# Patient Record
Sex: Female | Born: 1960 | Race: Black or African American | Hispanic: No | Smoking: Never smoker
Health system: Southern US, Community
[De-identification: ages and names within clinical notes are randomized; demographics above are authoritative.]

## PROBLEM LIST (undated history)

## (undated) DIAGNOSIS — K219 Gastro-esophageal reflux disease without esophagitis: Secondary | ICD-10-CM

## (undated) DIAGNOSIS — Z9221 Personal history of antineoplastic chemotherapy: Secondary | ICD-10-CM

## (undated) DIAGNOSIS — T884XXA Failed or difficult intubation, initial encounter: Secondary | ICD-10-CM

## (undated) DIAGNOSIS — I509 Heart failure, unspecified: Secondary | ICD-10-CM

## (undated) DIAGNOSIS — C50919 Malignant neoplasm of unspecified site of unspecified female breast: Secondary | ICD-10-CM

## (undated) DIAGNOSIS — G4730 Sleep apnea, unspecified: Secondary | ICD-10-CM

## (undated) DIAGNOSIS — J329 Chronic sinusitis, unspecified: Secondary | ICD-10-CM

## (undated) DIAGNOSIS — I4891 Unspecified atrial fibrillation: Secondary | ICD-10-CM

## (undated) DIAGNOSIS — I10 Essential (primary) hypertension: Secondary | ICD-10-CM

## (undated) DIAGNOSIS — Z923 Personal history of irradiation: Secondary | ICD-10-CM

## (undated) DIAGNOSIS — T8859XA Other complications of anesthesia, initial encounter: Secondary | ICD-10-CM

## (undated) DIAGNOSIS — Z853 Personal history of malignant neoplasm of breast: Secondary | ICD-10-CM

## (undated) DIAGNOSIS — K635 Polyp of colon: Secondary | ICD-10-CM

## (undated) DIAGNOSIS — E119 Type 2 diabetes mellitus without complications: Secondary | ICD-10-CM

## (undated) DIAGNOSIS — T4145XA Adverse effect of unspecified anesthetic, initial encounter: Secondary | ICD-10-CM

## (undated) HISTORY — DX: Polyp of colon: K63.5

## (undated) HISTORY — DX: Chronic sinusitis, unspecified: J32.9

## (undated) HISTORY — PX: DILATION AND CURETTAGE OF UTERUS: SHX78

## (undated) HISTORY — DX: Unspecified atrial fibrillation: I48.91

## (undated) HISTORY — PX: KNEE ARTHROSCOPY: SUR90

## (undated) HISTORY — PX: OTHER SURGICAL HISTORY: SHX169

## (undated) HISTORY — DX: Type 2 diabetes mellitus without complications: E11.9

## (undated) HISTORY — DX: Personal history of malignant neoplasm of breast: Z85.3

## (undated) HISTORY — DX: Essential (primary) hypertension: I10

## (undated) HISTORY — DX: Personal history of irradiation: Z92.3

## (undated) HISTORY — DX: Personal history of antineoplastic chemotherapy: Z92.21

## (undated) HISTORY — PX: COLONOSCOPY: SHX174

## (undated) HISTORY — PX: TONSILLECTOMY: SUR1361

## (undated) HISTORY — PX: KNEE SURGERY: SHX244

---

## 1988-11-24 DIAGNOSIS — Z853 Personal history of malignant neoplasm of breast: Secondary | ICD-10-CM

## 1988-11-24 DIAGNOSIS — Z9221 Personal history of antineoplastic chemotherapy: Secondary | ICD-10-CM

## 1988-11-24 HISTORY — DX: Personal history of malignant neoplasm of breast: Z85.3

## 1988-11-24 HISTORY — DX: Personal history of antineoplastic chemotherapy: Z92.21

## 1988-11-24 HISTORY — PX: MASTECTOMY: SHX3

## 1994-11-24 HISTORY — PX: ABDOMINAL HYSTERECTOMY: SHX81

## 1995-11-25 DIAGNOSIS — Z923 Personal history of irradiation: Secondary | ICD-10-CM

## 1995-11-25 HISTORY — DX: Personal history of irradiation: Z92.3

## 2004-09-04 ENCOUNTER — Ambulatory Visit: Payer: Self-pay | Admitting: General Surgery

## 2004-10-01 ENCOUNTER — Ambulatory Visit: Payer: Self-pay | Admitting: Oncology

## 2004-10-24 ENCOUNTER — Ambulatory Visit: Payer: Self-pay | Admitting: Oncology

## 2005-01-18 ENCOUNTER — Other Ambulatory Visit: Payer: Self-pay

## 2005-01-18 ENCOUNTER — Emergency Department: Payer: Self-pay | Admitting: Emergency Medicine

## 2005-03-26 ENCOUNTER — Encounter: Payer: Self-pay | Admitting: Orthopedic Surgery

## 2005-04-21 ENCOUNTER — Ambulatory Visit: Payer: Self-pay | Admitting: Orthopedic Surgery

## 2005-04-21 ENCOUNTER — Ambulatory Visit: Payer: Self-pay | Admitting: Internal Medicine

## 2005-08-14 ENCOUNTER — Ambulatory Visit: Payer: Self-pay | Admitting: Orthopedic Surgery

## 2005-09-04 ENCOUNTER — Other Ambulatory Visit: Payer: Self-pay

## 2005-09-09 ENCOUNTER — Ambulatory Visit: Payer: Self-pay | Admitting: General Surgery

## 2005-09-11 ENCOUNTER — Ambulatory Visit: Payer: Self-pay | Admitting: Orthopedic Surgery

## 2005-09-18 ENCOUNTER — Encounter: Payer: Self-pay | Admitting: Orthopedic Surgery

## 2005-09-24 ENCOUNTER — Encounter: Payer: Self-pay | Admitting: Orthopedic Surgery

## 2005-10-22 ENCOUNTER — Ambulatory Visit: Payer: Self-pay | Admitting: Oncology

## 2005-10-24 ENCOUNTER — Ambulatory Visit: Payer: Self-pay | Admitting: Oncology

## 2005-11-24 ENCOUNTER — Ambulatory Visit: Payer: Self-pay | Admitting: Oncology

## 2006-04-06 ENCOUNTER — Emergency Department: Payer: Self-pay | Admitting: Emergency Medicine

## 2006-04-08 ENCOUNTER — Emergency Department: Payer: Self-pay | Admitting: Emergency Medicine

## 2006-10-09 ENCOUNTER — Ambulatory Visit: Payer: Self-pay | Admitting: General Surgery

## 2006-11-24 DIAGNOSIS — K635 Polyp of colon: Secondary | ICD-10-CM

## 2006-11-24 DIAGNOSIS — I10 Essential (primary) hypertension: Secondary | ICD-10-CM

## 2006-11-24 HISTORY — DX: Essential (primary) hypertension: I10

## 2006-11-24 HISTORY — DX: Polyp of colon: K63.5

## 2006-12-10 ENCOUNTER — Ambulatory Visit: Payer: Self-pay | Admitting: Oncology

## 2006-12-25 ENCOUNTER — Ambulatory Visit: Payer: Self-pay | Admitting: Oncology

## 2007-03-10 ENCOUNTER — Ambulatory Visit: Payer: Self-pay | Admitting: General Practice

## 2007-03-10 ENCOUNTER — Other Ambulatory Visit: Payer: Self-pay

## 2007-05-03 ENCOUNTER — Ambulatory Visit: Payer: Self-pay | Admitting: General Practice

## 2007-06-16 ENCOUNTER — Ambulatory Visit: Payer: Self-pay | Admitting: General Practice

## 2007-07-13 ENCOUNTER — Ambulatory Visit: Payer: Self-pay | Admitting: Family Medicine

## 2007-08-31 ENCOUNTER — Ambulatory Visit: Payer: Self-pay | Admitting: General Surgery

## 2007-10-25 ENCOUNTER — Ambulatory Visit: Payer: Self-pay | Admitting: General Surgery

## 2008-08-24 ENCOUNTER — Ambulatory Visit: Payer: Self-pay | Admitting: General Surgery

## 2008-10-30 ENCOUNTER — Ambulatory Visit: Payer: Self-pay | Admitting: General Surgery

## 2008-12-11 ENCOUNTER — Ambulatory Visit: Payer: Self-pay | Admitting: General Surgery

## 2009-10-31 ENCOUNTER — Ambulatory Visit: Payer: Self-pay | Admitting: General Surgery

## 2010-06-04 ENCOUNTER — Other Ambulatory Visit: Payer: Self-pay | Admitting: Internal Medicine

## 2010-06-06 ENCOUNTER — Ambulatory Visit: Payer: Self-pay | Admitting: Internal Medicine

## 2010-08-27 ENCOUNTER — Ambulatory Visit: Payer: Self-pay

## 2010-10-12 ENCOUNTER — Ambulatory Visit: Payer: Self-pay | Admitting: General Practice

## 2010-11-01 ENCOUNTER — Ambulatory Visit: Payer: Self-pay | Admitting: General Practice

## 2010-11-20 ENCOUNTER — Ambulatory Visit: Payer: Self-pay | Admitting: General Surgery

## 2012-01-20 ENCOUNTER — Ambulatory Visit: Payer: Self-pay | Admitting: General Surgery

## 2013-01-01 ENCOUNTER — Encounter: Payer: Self-pay | Admitting: *Deleted

## 2013-01-01 DIAGNOSIS — C801 Malignant (primary) neoplasm, unspecified: Secondary | ICD-10-CM | POA: Insufficient documentation

## 2013-01-01 DIAGNOSIS — I10 Essential (primary) hypertension: Secondary | ICD-10-CM | POA: Insufficient documentation

## 2013-01-26 ENCOUNTER — Ambulatory Visit: Payer: Self-pay | Admitting: General Surgery

## 2013-02-01 ENCOUNTER — Encounter: Payer: Self-pay | Admitting: *Deleted

## 2013-02-08 ENCOUNTER — Encounter: Payer: Self-pay | Admitting: *Deleted

## 2013-02-09 ENCOUNTER — Encounter: Payer: Self-pay | Admitting: General Surgery

## 2013-02-09 ENCOUNTER — Ambulatory Visit (INDEPENDENT_AMBULATORY_CARE_PROVIDER_SITE_OTHER): Payer: 59 | Admitting: General Surgery

## 2013-02-09 VITALS — BP 115/80 | HR 80 | Resp 18 | Ht 66.0 in | Wt 334.0 lb

## 2013-02-09 DIAGNOSIS — Z853 Personal history of malignant neoplasm of breast: Secondary | ICD-10-CM | POA: Insufficient documentation

## 2013-02-09 NOTE — Progress Notes (Signed)
Subjective:     Patient ID: Mandy Roberts, female   DOB: [birth year], 52 y.o.   MRN: [id number]  HPI Patient presents for a follow up mammogram. Patient has a history of breast cancer which was diagnosed in 1990 in the left breast with a reoccurrence in 1998 in the left side of neck. Patient had left modified radical mastectomy of left breast in 1990. Patient underwent radiation and chemotherapy after she had reoccurrence in super clavicular node. Underwent two course of anti-hormonal therapy.    Review of Systems  Constitutional: Negative.   Respiratory: Negative.   Cardiovascular: Negative.        Objective:   Physical Exam  Constitutional: She appears well-developed and well-nourished.  Eyes: Conjunctivae are normal. Pupils are equal, round, and reactive to light.  Neck: Neck supple. No tracheal deviation present. No mass and no thyromegaly present.  Cardiovascular: Normal rate, regular rhythm and normal heart sounds.   Pulses:      Carotid pulses are 2+ on the right side, and 2+ on the left side.      Dorsalis pedis pulses are 2+ on the right side, and 2+ on the left side.       Posterior tibial pulses are 2+ on the right side, and 2+ on the left side.  Pulmonary/Chest: Effort normal and breath sounds normal. Right breast exhibits no inverted nipple, no mass, no nipple discharge, no skin change and no tenderness.    Abdominal: Soft. Bowel sounds are normal. There is no tenderness. No hernia. Hernia confirmed negative in the ventral area.  Lymphadenopathy:    She has no cervical adenopathy.    She has no axillary adenopathy.       Assessment:     Stable exam, no sign of cancer recurrence     Plan:     Pt still thinking about surgery for her obesity.

## 2013-02-09 NOTE — Patient Instructions (Signed)
Encouraged her to consult with bariatric surgeon.

## 2013-05-31 ENCOUNTER — Ambulatory Visit: Payer: Self-pay | Admitting: Podiatry

## 2013-09-12 ENCOUNTER — Emergency Department: Payer: Self-pay | Admitting: Emergency Medicine

## 2013-09-12 LAB — MAGNESIUM: Magnesium: 1.6 mg/dL — ABNORMAL LOW

## 2013-09-12 LAB — CBC
HGB: 14.8 g/dL (ref 12.0–16.0)
MCH: 29.9 pg (ref 26.0–34.0)
RBC: 4.94 10*6/uL (ref 3.80–5.20)
RDW: 13.8 % (ref 11.5–14.5)
WBC: 6.9 10*3/uL (ref 3.6–11.0)

## 2013-09-12 LAB — T4, FREE: Free Thyroxine: 0.82 ng/dL (ref 0.76–1.46)

## 2013-09-12 LAB — COMPREHENSIVE METABOLIC PANEL
Albumin: 3.3 g/dL — ABNORMAL LOW (ref 3.4–5.0)
Alkaline Phosphatase: 82 U/L (ref 50–136)
Anion Gap: 5 — ABNORMAL LOW (ref 7–16)
BUN: 9 mg/dL (ref 7–18)
Bilirubin,Total: 0.3 mg/dL (ref 0.2–1.0)
Co2: 27 mmol/L (ref 21–32)
Creatinine: 0.72 mg/dL (ref 0.60–1.30)
EGFR (African American): >60
EGFR (Non-African Amer.): >60
Glucose: 148 mg/dL — ABNORMAL HIGH (ref 65–99)
Potassium: 3.9 mmol/L (ref 3.5–5.1)

## 2013-09-12 LAB — TROPONIN I: Troponin-I: <0.02 ng/mL

## 2013-09-16 ENCOUNTER — Encounter: Payer: Self-pay | Admitting: Cardiovascular Disease

## 2013-09-16 ENCOUNTER — Encounter: Payer: Self-pay | Admitting: *Deleted

## 2013-09-16 ENCOUNTER — Ambulatory Visit (INDEPENDENT_AMBULATORY_CARE_PROVIDER_SITE_OTHER): Payer: 59 | Admitting: Cardiovascular Disease

## 2013-09-16 VITALS — BP 137/85 | HR 87 | Ht 66.0 in | Wt 335.0 lb

## 2013-09-16 DIAGNOSIS — I10 Essential (primary) hypertension: Secondary | ICD-10-CM

## 2013-09-16 DIAGNOSIS — G4733 Obstructive sleep apnea (adult) (pediatric): Secondary | ICD-10-CM

## 2013-09-16 DIAGNOSIS — R079 Chest pain, unspecified: Secondary | ICD-10-CM

## 2013-09-16 DIAGNOSIS — R002 Palpitations: Secondary | ICD-10-CM

## 2013-09-16 DIAGNOSIS — I4891 Unspecified atrial fibrillation: Secondary | ICD-10-CM

## 2013-09-16 MED ORDER — METOPROLOL TARTRATE 50 MG PO TABS: 50.0000 mg | ORAL_TABLET | Freq: Two times a day (BID) | ORAL | Status: DC

## 2013-09-16 MED ORDER — APIXABAN 5 MG PO TABS: 5.0000 mg | ORAL_TABLET | Freq: Two times a day (BID) | ORAL | Status: DC

## 2013-09-16 MED ORDER — FUROSEMIDE 20 MG PO TABS: 20.0000 mg | ORAL_TABLET | Freq: Every day | ORAL | Status: DC | PRN

## 2013-09-16 NOTE — Assessment & Plan Note (Signed)
We have encouraged continued exercise, careful diet management in an effort to lose weight. Underlying weight is likely contributing to poor sleep hygiene.

## 2013-09-16 NOTE — Progress Notes (Signed)
Patient ID: Mandy Roberts, female    DOB: [birth year], 52 y.o.   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  She reports having a very long history of paroxysmal palpitations. It has been worse recently. She reports having episodes 6-7 times per day. While at work, she was started on a telemetry monitor and this showed a run of atrial fibrillation on 09/12/2013. She was sitting at a desk at the time. She was relatively asymptomatic but did have some palpitations. She did feel that it was going mildly fast. Seemed to resolve without intervention.  She wakes frequently at nighttime, possibly from her breathing. Sometimes she feels that she has extra fluid. She does not have a diuretic.   Prior stress test July 2011 showing attenuation artifact in the anterior wall, ejection fraction 50% Lower extremity Doppler July 2014 showing no DVT Echocardiogram July 2011 showing ejection fraction 45-50%, moderate LVH Echocardiogram August 2008 showing ejection fraction 40-45%  Telemetry strips provided today from 09/12/2013 showing clear changed from normal sinus rhythm to atrial fibrillation with rate 139 beats per minute Magnesium at that time was 1.6 him a potassium 3.9., TSH 2.4   EKG today shows normal sinus rhythm with rate 87 beats per minute with no significant ST or T wave changes EKG from 09/12/2013 showing normal sinus rhythm with PVCs EKG from April 2008 showing normal sinus rhythm with rate 77 beats per minute    Outpatient Encounter Prescriptions as of 09/16/2013  Medication Sig Dispense Refill  . cloNIDine (CATAPRES - DOSED IN MG/24 HR) 0.1 mg/24hr patch Place 1 patch onto the skin once a week.      Marland Kitchen ibuprofen (ADVIL,MOTRIN) 200 MG tablet Take 200 mg by mouth as needed for pain.        Review  of Systems  Constitutional: Negative.   HENT: Negative.   Eyes: Negative.   Respiratory: Negative.   Cardiovascular: Positive for palpitations.  Gastrointestinal: Negative.   Endocrine: Negative.   Musculoskeletal: Negative.   Skin: Negative.   Allergic/Immunologic: Negative.   Neurological: Negative.   Hematological: Negative.   Psychiatric/Behavioral: Negative.   All other systems reviewed and are negative.    BP 137/85  Pulse 87  Ht 5\' 6"  (1.676 m)  Wt 335 lb (151.955 kg)  BMI 54.1 kg/m2  Physical Exam  Nursing note and vitals reviewed. Constitutional: She is oriented to person, place, and time. She appears well-developed and well-nourished.  HENT:  Head: Normocephalic.  Nose: Nose normal.  Mouth/Throat: Oropharynx is clear and moist.  Eyes: Conjunctivae are normal. Pupils are equal, round, and reactive to light.  Neck: Normal range of motion. Neck supple. No JVD present.  Cardiovascular: Normal rate, regular rhythm, S1 normal, S2 normal, normal heart sounds and intact distal pulses.  Exam reveals no gallop and no friction rub.   No murmur heard. Pulmonary/Chest: Effort normal and breath sounds normal. No respiratory distress. She has no wheezes. She has no rales. She exhibits no tenderness.  Abdominal: Soft. Bowel sounds are normal. She exhibits no distension. There is no tenderness.  Musculoskeletal: Normal range of motion. She exhibits no edema and no tenderness.  Lymphadenopathy:    She has no cervical adenopathy.  Neurological: She is alert and oriented to person, place, and time. Coordination normal.  Skin: Skin is warm and dry.  No rash noted. No erythema.  Psychiatric: She has a normal mood and affect. Her behavior is normal. Judgment and thought content normal.    Assessment and Plan

## 2013-09-16 NOTE — Patient Instructions (Signed)
Please start the metoprolol 1/2 pill twice a day Ok to wean down or off the clonidine patch  Try to increase the metoprolol up to 50 mg twice  a day if tolerated (whole pill)  Start eliquis 5 mg twice a day (blood thinner) Take lasix as needed with potassium for swelling  Monitor your heart rhythm  Please call us if you have new issues that need to be addressed before your next appt.  Your physician wants you to follow-up in: 1 month.

## 2013-09-16 NOTE — Assessment & Plan Note (Signed)
I suspect she has had a long history of paroxysmal atrial fibrillation. Possibly related to her underlying sleep apnea, unable to exclude diastolic CHF (mild). We have suggested she start on metoprolol 25 mg twice a day with slow titration up to 50 mg twice a day if tolerated. We'll start Lasix 20 mg as needed for shortness of breath, leg edema, weight gain. We'll also start Eliquis 5 mg twice a day as she continues to have numerous episodes daily.   We'll see her back in close followup. She's had 2 previous echocardiograms showing mildly depressed ejection fraction

## 2013-09-16 NOTE — Assessment & Plan Note (Signed)
In followup, we'll discuss scheduling a sleep study

## 2013-09-16 NOTE — Assessment & Plan Note (Signed)
With increasing metoprolol, we may need to hold her clonidine. We have suggested he slowly wean her clonidine if blood pressure decreases on beta blockers and Lasix.

## 2013-09-19 ENCOUNTER — Encounter: Payer: Self-pay | Admitting: *Deleted

## 2013-09-27 ENCOUNTER — Telehealth: Payer: Self-pay

## 2013-09-27 ENCOUNTER — Encounter: Payer: Self-pay | Admitting: *Deleted

## 2013-09-27 NOTE — Telephone Encounter (Signed)
Spoke w/ pt.  She requests a new letter indicating her diagnosis. Left new letter at front desk for her to pick up.

## 2013-09-27 NOTE — Telephone Encounter (Signed)
Lmom for pt to call back. 

## 2013-09-27 NOTE — Telephone Encounter (Signed)
Pt states she had to cancel her cruise and she has trip cancellation insurance, and needs a note stating she was here on the 09/16/2013. Please call.

## 2013-09-29 ENCOUNTER — Other Ambulatory Visit: Payer: Self-pay

## 2013-10-07 ENCOUNTER — Ambulatory Visit: Payer: Self-pay | Admitting: Internal Medicine

## 2013-10-17 ENCOUNTER — Ambulatory Visit: Payer: 59 | Admitting: Cardiovascular Disease

## 2014-02-06 ENCOUNTER — Ambulatory Visit: Payer: 59 | Admitting: General Surgery

## 2014-02-16 ENCOUNTER — Encounter: Payer: Self-pay | Admitting: *Deleted

## 2014-09-19 ENCOUNTER — Ambulatory Visit: Payer: Self-pay | Admitting: General Surgery

## 2014-09-19 ENCOUNTER — Encounter: Payer: Self-pay | Admitting: General Surgery

## 2014-09-20 ENCOUNTER — Encounter: Payer: Self-pay | Admitting: General Surgery

## 2014-09-25 ENCOUNTER — Encounter: Payer: Self-pay | Admitting: Cardiovascular Disease

## 2014-09-26 ENCOUNTER — Ambulatory Visit (INDEPENDENT_AMBULATORY_CARE_PROVIDER_SITE_OTHER): Payer: Self-pay | Admitting: General Surgery

## 2014-09-26 ENCOUNTER — Encounter: Payer: Self-pay | Admitting: General Surgery

## 2014-09-26 VITALS — BP 132/74 | HR 76 | Resp 14 | Ht 66.0 in | Wt 337.0 lb

## 2014-09-26 DIAGNOSIS — Z86010 Personal history of colonic polyps: Secondary | ICD-10-CM

## 2014-09-26 DIAGNOSIS — Z853 Personal history of malignant neoplasm of breast: Secondary | ICD-10-CM

## 2014-09-26 MED ORDER — POLYETHYLENE GLYCOL 3350 17 GM/SCOOP PO POWD: 1.0000 | Freq: Once | ORAL | Status: DC

## 2014-09-26 NOTE — Patient Instructions (Addendum)
Continue self breast exams. Call office for any new breast issues or concerns.  Colonoscopy A colonoscopy is an exam to look at the entire large intestine (colon). This exam can help find problems such as tumors, polyps, inflammation, and areas of bleeding. The exam takes about 1 hour.  LET [facility] CARE PROVIDER KNOW ABOUT:   Any allergies you have.  All medicines you are taking, including vitamins, herbs, eye drops, creams, and over-the-counter medicines.  Previous problems you or members of your family have had with the use of anesthetics.  Any blood disorders you have.  Previous surgeries you have had.  Medical conditions you have. RISKS AND COMPLICATIONS  Generally, this is a safe procedure. However, as with any procedure, complications can occur. Possible complications include:  Bleeding.  Tearing or rupture of the colon wall.  Reaction to medicines given during the exam.  Infection (rare). BEFORE THE PROCEDURE   Ask your health care provider about changing or stopping your regular medicines.  You may be prescribed an oral bowel prep. This involves drinking a large amount of medicated liquid, starting the day before your procedure. The liquid will cause you to have multiple loose stools until your stool is almost clear or light green. This cleans out your colon in preparation for the procedure.  Do not eat or drink anything else once you have started the bowel prep, unless your health care provider tells you it is safe to do so.  Arrange for someone to drive you home after the procedure. PROCEDURE   You will be given medicine to help you relax (sedative).  You will lie on your side with your knees bent.  A long, flexible tube with a light and camera on the end (colonoscope) will be inserted through the rectum and into the colon. The camera sends video back to a computer screen as it moves through the colon. The colonoscope also releases carbon dioxide gas to inflate  the colon. This helps your health care provider see the area better.  During the exam, your health care provider may take a small tissue sample (biopsy) to be examined under a microscope if any abnormalities are found.  The exam is finished when the entire colon has been viewed. AFTER THE PROCEDURE   Do not drive for 24 hours after the exam.  You may have a small amount of blood in your stool.  You may pass moderate amounts of gas and have mild abdominal cramping or bloating. This is caused by the gas used to inflate your colon during the exam.  Ask when your test results will be ready and how you will get your results. Make sure you get your test results. Document Released: 11/07/2000 Document Revised: 08/31/2013 Document Reviewed: 07/18/2013 [facility] Patient Information 2015 [place], [place]. This information is not intended to replace advice given to you by your health care provider. Make sure you discuss any questions you have with your health care provider.  Patient has been scheduled for a colonoscopy at [facility] on 10/11/14. She is aware to pre register with the hospital at least 2 days prior. She will stop her Eliquis and start ASA 81 mg 3 days prior to her colonoscopy. She will only take her Sotalol at 6 am with a small sip of water the morning of. Miralax prescription has been sent into her pharmacy. The patient is aware of date and instructions.

## 2014-09-26 NOTE — Progress Notes (Signed)
Patient ID: Mandy Roberts, female   DOB: Apr 12, 1961, 53 y.o.   MRN: [id number]  Chief Complaint  Patient presents with  . Follow-up    mammogram    HPI Mandy Roberts is a 53 y.o. female.  who presents for breast cancer follow up and breast evaluation. The most recent right mammogram was done on 09-19-14.  Patient has a history of breast cancer which was diagnosed in 1990 in the left breast with a reoccurrence in 1998 in the left side of neck. Patient had left modified radical mastectomy of left breast in 1990. Patient underwent radiation and chemotherapy after she had reoccurrence in super clavicular node. Underwent two course of anti-hormonal therapy.   She was in the process of arranging bariatric surgery when her husband had cardiac issues. Last colonoscopy was 2008 with colon polyps.  Patient does perform regular self breast checks and gets regular mammograms done.  No new breast issues.  HPI  Past Medical History  Diagnosis Date  . Hypertension 2008  . Asthma   . Personal history of malignant neoplasm of breast 1990     left mastectomy   . Cancer 1990     left mastectomy ,had recurrence in left suprclavic nodes several yrs ago-treated with radiation and chemo  . Atrial fibrillation   . Sinusitis   . Diabetes mellitus without complication   . Colon polyp 2008    Past Surgical History  Procedure Laterality Date  . Colonoscopy  2008    Dr. Jamal Collin  . Knee surgery Left 2009, 2010, 2012  . Mastectomy  1990    left   . Abdominal hysterectomy  1996  . Dilation and curettage of uterus    . Tonsillectomy      Family History  Problem Relation Age of Onset  . Breast cancer    . Heart failure Mother   . Hypertension Mother   . Hyperlipidemia Mother     Social History History  Substance Use Topics  . Smoking status: Never Smoker   . Smokeless tobacco: Never Used  . Alcohol Use: No    Allergies  Allergen Reactions  . Tdap [Diphth-Acell Pertussis-Tetanus]  Shortness Of Breath  . Penicillins Rash    Current Outpatient Prescriptions  Medication Sig Dispense Refill  . apixaban (ELIQUIS) 5 MG TABS tablet Take 1 tablet (5 mg total) by mouth 2 (two) times daily. 60 tablet 6  . ibuprofen (ADVIL,MOTRIN) 200 MG tablet Take 200 mg by mouth as needed for pain.    Marland Kitchen lisinopril-hydrochlorothiazide (PRINZIDE,ZESTORETIC) 10-12.5 MG per tablet Take 1 tablet by mouth daily.    . sotalol (BETAPACE) 80 MG tablet Take 80 mg by mouth 2 (two) times daily.   2   No current facility-administered medications for this visit.    Review of Systems Review of Systems  Constitutional: Negative.   Respiratory: Negative.   Cardiovascular: Negative.     Blood pressure 132/74, pulse 76, resp. rate 14, height 5\' 6"  (1.676 m), weight 337 lb (152.862 kg).  Physical Exam Physical Exam  Constitutional: She is oriented to person, place, and time. She appears well-developed and well-nourished.  Eyes: Conjunctivae are normal. No scleral icterus.  Neck: Neck supple.  Cardiovascular: Normal rate, regular rhythm and normal heart sounds.   Pulses:      Dorsalis pedis pulses are 2+ on the left side.       Posterior tibial pulses are 2+ on the left side.  No edema lower legs, there is a small  brownish colored area above the left anterior ankle.  Pulmonary/Chest: Effort normal and breath sounds normal. Right breast exhibits no inverted nipple, no mass, no nipple discharge, no skin change and no tenderness.  Left mastectomy site well healed incison.  Abdominal: Soft. Normal appearance. There is no tenderness.  Lymphadenopathy:    She has no cervical adenopathy.    She has no axillary adenopathy.  Neurological: She is alert and oriented to person, place, and time.  Skin: Skin is warm and dry.    Data Reviewed Mammogram reviewed and stable.  Assessment    Stable physical exam. History of left breast CA. History of colon polyp    Plan  Colonoscopy with possible  biopsy/polypectomy prn: Information regarding the procedure, including its potential risks and complications (including but not limited to perforation of the bowel, which may require emergency surgery to repair, and bleeding) was verbally given to the patient. Educational information regarding lower instestinal endoscopy was given to the patient. Written instructions for how to complete the bowel prep using Miralax were provided. The importance of drinking ample fluids to avoid dehydration as a result of the prep emphasized.    Follow up in one year with right mammogram and office visit.    Patient has been scheduled for a colonoscopy at [facility] on 10/11/14. She is aware to pre register with the hospital at least 2 days prior. She will stop her Eliquis and start ASA 81 mg 3 days prior to her colonoscopy. She will only take her Sotalol at 6 am with a small sip of water the morning of. Miralax prescription has been sent into her pharmacy. The patient is aware of date and instructions.     Mandy Roberts 09/26/2014, 11:02 AM

## 2014-10-02 ENCOUNTER — Other Ambulatory Visit: Payer: Self-pay | Admitting: General Surgery

## 2014-10-02 DIAGNOSIS — Z86010 Personal history of colonic polyps: Secondary | ICD-10-CM

## 2014-10-11 ENCOUNTER — Ambulatory Visit: Payer: Self-pay | Admitting: General Surgery

## 2014-10-11 DIAGNOSIS — Z86010 Personal history of colonic polyps: Secondary | ICD-10-CM

## 2014-10-12 ENCOUNTER — Encounter: Payer: Self-pay | Admitting: General Surgery

## 2014-11-20 ENCOUNTER — Ambulatory Visit: Payer: Self-pay | Admitting: Specialist

## 2014-11-20 LAB — PROTIME-INR
INR: 1.1
PROTHROMBIN TIME: 13.9 s (ref 11.5–14.7)

## 2014-11-20 LAB — BILIRUBIN, DIRECT

## 2014-11-20 LAB — FERRITIN: Ferritin (ARMC): 289 ng/mL (ref 8–388)

## 2014-11-20 LAB — MAGNESIUM: MAGNESIUM: 2 mg/dL

## 2014-11-20 LAB — PHOSPHORUS: PHOSPHORUS: 2.8 mg/dL (ref 2.5–4.9)

## 2014-11-20 LAB — IRON AND TIBC
Iron Bind.Cap.(Total): 263 ug/dL (ref 250–450)
Iron Saturation: 29 %
Iron: 76 ug/dL (ref 50–170)
UNBOUND IRON-BIND. CAP.: 187 ug/dL

## 2014-11-20 LAB — FOLATE: Folic Acid: 5.2 ng/mL (ref 3.1–17.5)

## 2014-11-20 LAB — LIPASE, BLOOD: Lipase: 64 U/L — ABNORMAL LOW (ref 73–393)

## 2014-11-20 LAB — APTT: Activated PTT: 31.7 secs (ref 23.6–35.9)

## 2014-11-20 LAB — AMYLASE: Amylase: 45 U/L (ref 25–115)

## 2014-11-23 ENCOUNTER — Ambulatory Visit: Payer: Self-pay | Admitting: Specialist

## 2014-11-24 ENCOUNTER — Ambulatory Visit: Payer: Self-pay | Admitting: Specialist

## 2014-11-28 ENCOUNTER — Ambulatory Visit: Payer: Self-pay | Admitting: Specialist

## 2014-12-04 ENCOUNTER — Ambulatory Visit: Payer: Self-pay | Admitting: Internal Medicine

## 2014-12-12 ENCOUNTER — Encounter: Payer: Self-pay | Admitting: General Surgery

## 2014-12-12 ENCOUNTER — Other Ambulatory Visit: Payer: Self-pay

## 2014-12-12 ENCOUNTER — Ambulatory Visit (INDEPENDENT_AMBULATORY_CARE_PROVIDER_SITE_OTHER): Payer: 59 | Admitting: General Surgery

## 2014-12-12 VITALS — BP 140/90 | HR 92 | Resp 20 | Ht 64.0 in | Wt 334.0 lb

## 2014-12-12 DIAGNOSIS — Z01818 Encounter for other preprocedural examination: Secondary | ICD-10-CM

## 2014-12-12 NOTE — Patient Instructions (Signed)
Patient to be scheduled for a endoscopy.

## 2014-12-12 NOTE — Progress Notes (Signed)
Patient ID: Mandy Roberts, female   DOB: 03-Apr-1961, 54 y.o.   MRN: [id number]  Chief Complaint  Patient presents with  . Follow-up    endoscopy    HPI Mandy Roberts is a 54 y.o. female here today to discuss endoscopy. She is undergoing full evaluation in preparation for bariatric surgery and upper endoscopy was requested as part of this  Patient had a chest x-ray and ultrasound-no findings  HPI  Past Medical History  Diagnosis Date  . Hypertension 2008  . Asthma   . Personal history of malignant neoplasm of breast 1990     left mastectomy   . Cancer 1990     left mastectomy ,had recurrence in left suprclavic nodes several yrs ago-treated with radiation and chemo  . Atrial fibrillation   . Sinusitis   . Diabetes mellitus without complication   . Colon polyp 2008    Past Surgical History  Procedure Laterality Date  . Colonoscopy  2008    Dr. Jamal Collin  . Knee surgery Left 2009, 2010, 2012  . Mastectomy  1990    left   . Abdominal hysterectomy  1996  . Dilation and curettage of uterus    . Tonsillectomy      Family History  Problem Relation Age of Onset  . Breast cancer    . Heart failure Mother   . Hypertension Mother   . Hyperlipidemia Mother     Social History History  Substance Use Topics  . Smoking status: Never Smoker   . Smokeless tobacco: Never Used  . Alcohol Use: No    Allergies  Allergen Reactions  . Tdap [Diphth-Acell Pertussis-Tetanus] Shortness Of Breath  . Penicillins Rash    Current Outpatient Prescriptions  Medication Sig Dispense Refill  . apixaban (ELIQUIS) 5 MG TABS tablet Take 1 tablet (5 mg total) by mouth 2 (two) times daily. 60 tablet 6  . ibuprofen (ADVIL,MOTRIN) 200 MG tablet Take 200 mg by mouth as needed for pain.    Marland Kitchen lisinopril-hydrochlorothiazide (PRINZIDE,ZESTORETIC) 10-12.5 MG per tablet Take 1 tablet by mouth daily.    . polyethylene glycol powder (GLYCOLAX/MIRALAX) powder Take 255 g by mouth once. 255 g 0  . sotalol  (BETAPACE) 80 MG tablet Take 80 mg by mouth 2 (two) times daily.   2  . Vitamin D, Ergocalciferol, (DRISDOL) 50000 UNITS CAPS capsule Take 50,000 Units by mouth every 7 (seven) days.     No current facility-administered medications for this visit.    Review of Systems Review of Systems  Constitutional: Positive for chills. Negative for fever, diaphoresis, activity change, appetite change, fatigue and unexpected weight change.  Respiratory: Negative.   Cardiovascular: Negative.   Gastrointestinal: Positive for vomiting. Negative for nausea, abdominal pain, diarrhea, blood in stool, abdominal distention, anal bleeding and rectal pain.    Blood pressure 140/90, pulse 92, resp. rate 20, height 5\' 4"  (1.626 m), weight 334 lb (151.501 kg).  Physical Exam Physical Exam  Constitutional: She is oriented to person, place, and time. She appears well-developed and well-nourished.  Neck: Neck supple. No thyromegaly present.  Cardiovascular: Normal rate, regular rhythm and normal heart sounds.   Pulmonary/Chest: Effort normal and breath sounds normal.  Abdominal: Soft. Normal appearance and bowel sounds are normal. There is no tenderness.  Neurological: She is alert and oriented to person, place, and time.  Skin: Skin is warm and dry.    Data Reviewed None   Assessment    Need for upper endoscopy -pre evaluation for  bariatric surgery    Plan    Discussed endoscopy, patient agrees        Christene Lye 12/12/2014, 10:03 AM

## 2014-12-12 NOTE — Progress Notes (Signed)
Patient is scheduled for an upper endoscopy at [facility] on 12/27/14. She will hold her Amaryl the day of procedure. She will only take her blood pressure medication at 6 am with a small sip of water the morning of the procedure. She is aware to pre register with the hospital at least 2 days prior. Patient is aware of date and instructions.

## 2014-12-13 ENCOUNTER — Ambulatory Visit: Payer: Self-pay | Admitting: Specialist

## 2014-12-20 ENCOUNTER — Other Ambulatory Visit: Payer: Self-pay | Admitting: General Surgery

## 2014-12-20 DIAGNOSIS — Z01818 Encounter for other preprocedural examination: Secondary | ICD-10-CM

## 2014-12-25 ENCOUNTER — Ambulatory Visit: Payer: Self-pay | Admitting: Specialist

## 2014-12-27 ENCOUNTER — Ambulatory Visit: Payer: Self-pay | Admitting: General Surgery

## 2014-12-27 DIAGNOSIS — Z01818 Encounter for other preprocedural examination: Secondary | ICD-10-CM

## 2014-12-28 ENCOUNTER — Encounter: Payer: Self-pay | Admitting: General Surgery

## 2014-12-29 ENCOUNTER — Encounter: Payer: Self-pay | Admitting: General Surgery

## 2015-02-12 ENCOUNTER — Encounter: Payer: Self-pay | Admitting: *Deleted

## 2015-03-19 LAB — SURGICAL PATHOLOGY

## 2015-04-09 ENCOUNTER — Encounter
Admission: RE | Admit: 2015-04-09 | Discharge: 2015-04-09 | Disposition: A | Discharge status: Home / Self Care | Payer: 59 | Source: Ambulatory Visit | Attending: Specialist | Admitting: Specialist

## 2015-04-09 ENCOUNTER — Encounter: Payer: Self-pay | Admitting: Anesthesiology

## 2015-04-09 DIAGNOSIS — Z01812 Encounter for preprocedural laboratory examination: Secondary | ICD-10-CM | POA: Insufficient documentation

## 2015-04-09 DIAGNOSIS — E669 Obesity, unspecified: Secondary | ICD-10-CM | POA: Diagnosis not present

## 2015-04-09 DIAGNOSIS — T884XXA Failed or difficult intubation, initial encounter: Secondary | ICD-10-CM | POA: Insufficient documentation

## 2015-04-09 DIAGNOSIS — Z6841 Body Mass Index (BMI) 40.0 and over, adult: Secondary | ICD-10-CM | POA: Diagnosis not present

## 2015-04-09 DIAGNOSIS — R0981 Nasal congestion: Secondary | ICD-10-CM | POA: Insufficient documentation

## 2015-04-09 HISTORY — DX: Failed or difficult intubation, initial encounter: T88.4XXA

## 2015-04-09 HISTORY — DX: Sleep apnea, unspecified: G47.30

## 2015-04-09 LAB — BASIC METABOLIC PANEL
Anion gap: 7 (ref 5–15)
BUN: 15 mg/dL (ref 6–20)
CO2: 25 mmol/L (ref 22–32)
CREATININE: 0.63 mg/dL (ref 0.44–1.00)
Calcium: 9.4 mg/dL (ref 8.9–10.3)
Chloride: 104 mmol/L (ref 101–111)
GFR calc Af Amer: >60 mL/min (ref >60)
GLUCOSE: 128 mg/dL — AB (ref 65–99)
Potassium: 4.4 mmol/L (ref 3.5–5.1)
Sodium: 136 mmol/L (ref 135–145)

## 2015-04-09 LAB — ABO/RH: ABO/RH(D): O POS

## 2015-04-09 LAB — TYPE AND SCREEN
ABO/RH(D): O POS
Antibody Screen: NEGATIVE

## 2015-04-09 NOTE — Patient Instructions (Signed)
  Your procedure is scheduled on: Apr 17, 2015 Report to Same Day Surgery. To find out your arrival time please call [phone] between 1PM - 3PM on Apr 16, 2015 Remember: Instructions that are not followed completely may result in serious medical risk, up to and including death, or upon the discretion of your surgeon and anesthesiologist your surgery may need to be rescheduled.    __x 1. Do not eat food or drink liquids after midnight. No gum chewing or hard candies.     ____ 2. No Alcohol for 24 hours before or after surgery.   ____ 3. Bring all medications with you on the day of surgery if instructed.    ___x_ 4. Notify your doctor if there is any change in your medical condition     (cold, fever, infections).     Do not wear jewelry, make-up, hairpins, clips or nail polish.  Do not wear lotions, powders, or perfumes. You may wear deodorant.  Do not shave 48 hours prior to surgery. Men may shave face and neck.  Do not bring valuables to the hospital.    [facility] is not responsible for any belongings or valuables.               Contacts, dentures or bridgework may not be worn into surgery.  Leave your suitcase in the car. After surgery it may be brought to your room.  For patients admitted to the hospital, discharge time is determined by your                treatment team.   Patients discharged the day of surgery will not be allowed to drive home.   Please read over the following fact sheets that you were given:   Surgical Site Infection Prevention   ____ Take these medicines the morning of surgery with A SIP OF WATER:    1.Betapace  2.   3.   4.  5.  6.  ____ Fleet Enema (as directed)   __x_ Use CHG Soap as directed  ____ Use inhalers on the day of surgery  ____ Stop metformin 2 days prior to surgery    ____ Take 1/2 of usual insulin dose the night before surgery and none on the morning of surgery.   __x_ Stop Coumadin/Plavix/aspirin on( stop Eliquis seven  days prior to surgery) ____ Stop Anti-inflammatories on (seven to ten days prior to surgery, Ibuprofen)  ____ Stop supplements until after surgery.    __x__ Bring C-Pap to the hospital.

## 2015-04-17 ENCOUNTER — Inpatient Hospital Stay
Admission: RE | Admit: 2015-04-17 | Discharge: 2015-04-19 | DRG: 641 | Disposition: A | Discharge status: Home / Self Care | Payer: 59 | Source: Ambulatory Visit | Attending: Specialist | Admitting: Specialist

## 2015-04-17 ENCOUNTER — Encounter: Payer: Self-pay | Admitting: *Deleted

## 2015-04-17 ENCOUNTER — Inpatient Hospital Stay: Payer: 59 | Admitting: Certified Registered Nurse Anesthetist

## 2015-04-17 ENCOUNTER — Encounter
Admission: RE | Disposition: A | Discharge status: Home / Self Care | Payer: Self-pay | Source: Ambulatory Visit | Attending: Specialist

## 2015-04-17 DIAGNOSIS — E119 Type 2 diabetes mellitus without complications: Secondary | ICD-10-CM | POA: Diagnosis present

## 2015-04-17 DIAGNOSIS — G4733 Obstructive sleep apnea (adult) (pediatric): Secondary | ICD-10-CM | POA: Diagnosis present

## 2015-04-17 DIAGNOSIS — Z880 Allergy status to penicillin: Secondary | ICD-10-CM | POA: Diagnosis not present

## 2015-04-17 DIAGNOSIS — R109 Unspecified abdominal pain: Secondary | ICD-10-CM

## 2015-04-17 DIAGNOSIS — Z853 Personal history of malignant neoplasm of breast: Secondary | ICD-10-CM

## 2015-04-17 DIAGNOSIS — I4891 Unspecified atrial fibrillation: Secondary | ICD-10-CM | POA: Diagnosis present

## 2015-04-17 DIAGNOSIS — I10 Essential (primary) hypertension: Secondary | ICD-10-CM | POA: Diagnosis present

## 2015-04-17 DIAGNOSIS — R1012 Left upper quadrant pain: Secondary | ICD-10-CM

## 2015-04-17 DIAGNOSIS — Z6841 Body Mass Index (BMI) 40.0 and over, adult: Secondary | ICD-10-CM | POA: Diagnosis not present

## 2015-04-17 HISTORY — PX: LAPAROSCOPIC GASTRIC RESTRICTIVE DUODENAL PROCEDURE (DUODENAL SWITCH): SHX6667

## 2015-04-17 HISTORY — PX: UPPER GI ENDOSCOPY: SHX6162

## 2015-04-17 LAB — GLUCOSE, CAPILLARY
Glucose-Capillary: 120 mg/dL — ABNORMAL HIGH (ref 65–99)
Glucose-Capillary: 202 mg/dL — ABNORMAL HIGH (ref 65–99)

## 2015-04-17 SURGERY — LAPAROSCOPIC GASTRIC RESTRICTIVE DUODENAL PROCEDURE (DUODENAL SWITCH)
Anesthesia: General | Wound class: Clean Contaminated

## 2015-04-17 MED ORDER — LIDOCAINE HCL (CARDIAC) 20 MG/ML IV SOLN
INTRAVENOUS | Status: DC | PRN
  Administered 2015-04-17: 50 mg via INTRAVENOUS

## 2015-04-17 MED ORDER — CLINDAMYCIN PHOSPHATE 900 MG/50ML IV SOLN
900.0000 mg | Freq: Once | INTRAVENOUS | Status: AC
  Administered 2015-04-17: 900 mg via INTRAVENOUS

## 2015-04-17 MED ORDER — SUCCINYLCHOLINE CHLORIDE 20 MG/ML IJ SOLN
INTRAMUSCULAR | Status: DC | PRN
  Administered 2015-04-17: 120 mg via INTRAVENOUS

## 2015-04-17 MED ORDER — DEXAMETHASONE SODIUM PHOSPHATE 10 MG/ML IJ SOLN
INTRAMUSCULAR | Status: AC
  Administered 2015-04-17: 10 mg via INTRAVENOUS
  Filled 2015-04-17: qty 1

## 2015-04-17 MED ORDER — BUPIVACAINE-EPINEPHRINE (PF) 0.5% -1:200000 IJ SOLN
INTRAMUSCULAR | Status: AC
  Filled 2015-04-17: qty 30

## 2015-04-17 MED ORDER — LIDOCAINE-EPINEPHRINE (PF) 1 %-1:200000 IJ SOLN
INTRAMUSCULAR | Status: DC | PRN
  Administered 2015-04-17: 20 mL

## 2015-04-17 MED ORDER — ONDANSETRON HCL 4 MG/2ML IJ SOLN
INTRAMUSCULAR | Status: DC | PRN
  Administered 2015-04-17: 4 mg via INTRAVENOUS

## 2015-04-17 MED ORDER — SCOPOLAMINE 1 MG/3DAYS TD PT72
MEDICATED_PATCH | TRANSDERMAL | Status: DC
Start: 2015-04-17 — End: 2015-04-19
  Administered 2015-04-17: 1.5 mg via TRANSDERMAL
  Filled 2015-04-17: qty 1

## 2015-04-17 MED ORDER — CLINDAMYCIN PHOSPHATE 600 MG/50ML IV SOLN
600.0000 mg | Freq: Three times a day (TID) | INTRAVENOUS | Status: AC
  Administered 2015-04-17 – 2015-04-18 (×2): 600 mg via INTRAVENOUS
  Filled 2015-04-17 (×2): qty 50

## 2015-04-17 MED ORDER — ACETAMINOPHEN 10 MG/ML IV SOLN
INTRAVENOUS | Status: AC
  Administered 2015-04-17: 1000 mg via INTRAVENOUS
  Filled 2015-04-17: qty 100

## 2015-04-17 MED ORDER — SOTALOL HCL 80 MG PO TABS
80.0000 mg | ORAL_TABLET | Freq: Two times a day (BID) | ORAL | Status: DC
  Administered 2015-04-18: 80 mg via ORAL
  Filled 2015-04-17 (×2): qty 1

## 2015-04-17 MED ORDER — FENTANYL CITRATE (PF) 100 MCG/2ML IJ SOLN
INTRAMUSCULAR | Status: DC | PRN
  Administered 2015-04-17 (×2): 25 ug via INTRAVENOUS
  Administered 2015-04-17: 50 ug via INTRAVENOUS

## 2015-04-17 MED ORDER — SUGAMMADEX SODIUM 200 MG/2ML IV SOLN
INTRAVENOUS | Status: DC | PRN
  Administered 2015-04-17: 200 mg via INTRAVENOUS

## 2015-04-17 MED ORDER — MORPHINE SULFATE 2 MG/ML IJ SOLN
2.0000 mg | INTRAMUSCULAR | Status: DC | PRN
  Administered 2015-04-17: 4 mg via INTRAVENOUS
  Administered 2015-04-17: 2 mg via INTRAVENOUS
  Administered 2015-04-18: 4 mg via INTRAVENOUS
  Administered 2015-04-18: 2 mg via INTRAVENOUS
  Filled 2015-04-17 (×2): qty 1
  Filled 2015-04-17 (×2): qty 2

## 2015-04-17 MED ORDER — ENOXAPARIN SODIUM 40 MG/0.4ML ~~LOC~~ SOLN: 40.0000 mg | Freq: Two times a day (BID) | SUBCUTANEOUS | Status: DC

## 2015-04-17 MED ORDER — FENTANYL CITRATE (PF) 100 MCG/2ML IJ SOLN
INTRAMUSCULAR | Status: AC
  Filled 2015-04-17: qty 2

## 2015-04-17 MED ORDER — DEXAMETHASONE SODIUM PHOSPHATE 10 MG/ML IJ SOLN
10.0000 mg | Freq: Once | INTRAMUSCULAR | Status: AC
  Administered 2015-04-17: 10 mg via INTRAVENOUS

## 2015-04-17 MED ORDER — ENOXAPARIN SODIUM 40 MG/0.4ML ~~LOC~~ SOLN
40.0000 mg | Freq: Two times a day (BID) | SUBCUTANEOUS | Status: DC
  Administered 2015-04-18 (×2): 40 mg via SUBCUTANEOUS
  Filled 2015-04-17 (×3): qty 0.4

## 2015-04-17 MED ORDER — ONDANSETRON HCL 4 MG/2ML IJ SOLN
INTRAMUSCULAR | Status: AC
  Administered 2015-04-17: 4 mg via INTRAVENOUS
  Filled 2015-04-17: qty 2

## 2015-04-17 MED ORDER — DEXAMETHASONE SODIUM PHOSPHATE 4 MG/ML IJ SOLN
INTRAMUSCULAR | Status: DC | PRN
  Administered 2015-04-17: 5 mg via INTRAVENOUS

## 2015-04-17 MED ORDER — FENTANYL CITRATE (PF) 100 MCG/2ML IJ SOLN
25.0000 ug | INTRAMUSCULAR | Status: DC | PRN
  Administered 2015-04-17 (×4): 25 ug via INTRAVENOUS

## 2015-04-17 MED ORDER — ONDANSETRON HCL 4 MG/2ML IJ SOLN: 4.0000 mg | Freq: Once | INTRAMUSCULAR | Status: DC | PRN

## 2015-04-17 MED ORDER — ONDANSETRON 4 MG PO TBDP: 4.0000 mg | ORAL_TABLET | ORAL | Status: DC | PRN

## 2015-04-17 MED ORDER — SODIUM CHLORIDE 0.9 % IV SOLN
INTRAVENOUS | Status: DC
  Administered 2015-04-17: 11:00:00 via INTRAVENOUS

## 2015-04-17 MED ORDER — SCOPOLAMINE 1 MG/3DAYS TD PT72
1.0000 | MEDICATED_PATCH | TRANSDERMAL | Status: DC
  Administered 2015-04-17: 1.5 mg via TRANSDERMAL

## 2015-04-17 MED ORDER — LIDOCAINE HCL (PF) 1 % IJ SOLN
INTRAMUSCULAR | Status: AC
  Administered 2015-04-17: 2 mL
  Filled 2015-04-17: qty 2

## 2015-04-17 MED ORDER — ACETAMINOPHEN 10 MG/ML IV SOLN
1000.0000 mg | Freq: Four times a day (QID) | INTRAVENOUS | Status: AC
  Administered 2015-04-17 – 2015-04-18 (×4): 1000 mg via INTRAVENOUS
  Filled 2015-04-17 (×4): qty 100

## 2015-04-17 MED ORDER — LIDOCAINE-EPINEPHRINE (PF) 1 %-1:200000 IJ SOLN
INTRAMUSCULAR | Status: AC
  Filled 2015-04-17: qty 30

## 2015-04-17 MED ORDER — PROPOFOL 10 MG/ML IV BOLUS
INTRAVENOUS | Status: DC | PRN
  Administered 2015-04-17: 200 mg via INTRAVENOUS

## 2015-04-17 MED ORDER — ONDANSETRON HCL 4 MG/2ML IJ SOLN
4.0000 mg | INTRAMUSCULAR | Status: DC | PRN
  Administered 2015-04-17 – 2015-04-19 (×4): 4 mg via INTRAVENOUS
  Filled 2015-04-17 (×4): qty 2

## 2015-04-17 MED ORDER — HYDROCODONE-ACETAMINOPHEN 7.5-325 MG/15ML PO SOLN: 15.0000 mL | Freq: Four times a day (QID) | ORAL | Status: DC | PRN

## 2015-04-17 MED ORDER — ACETAMINOPHEN 10 MG/ML IV SOLN
1000.0000 mg | Freq: Once | INTRAVENOUS | Status: AC
  Administered 2015-04-17: 1000 mg via INTRAVENOUS

## 2015-04-17 MED ORDER — SODIUM CHLORIDE 0.9 % IR SOLN
Status: DC | PRN
  Administered 2015-04-17: 300 mL

## 2015-04-17 MED ORDER — BUPIVACAINE-EPINEPHRINE (PF) 0.5% -1:200000 IJ SOLN
INTRAMUSCULAR | Status: DC | PRN
  Administered 2015-04-17: 20 mL

## 2015-04-17 MED ORDER — ROCURONIUM BROMIDE 100 MG/10ML IV SOLN
INTRAVENOUS | Status: DC | PRN
  Administered 2015-04-17: 50 mg via INTRAVENOUS

## 2015-04-17 MED ORDER — SODIUM CHLORIDE 0.9 % IV SOLN
INTRAVENOUS | Status: DC
  Administered 2015-04-17 – 2015-04-19 (×3): via INTRAVENOUS

## 2015-04-17 MED ORDER — CLINDAMYCIN PHOSPHATE 900 MG/50ML IV SOLN
INTRAVENOUS | Status: AC
  Administered 2015-04-17: 900 mg via INTRAVENOUS
  Filled 2015-04-17: qty 50

## 2015-04-17 MED ORDER — SODIUM CHLORIDE 0.9 % IV SOLN
INTRAVENOUS | Status: DC
  Administered 2015-04-17 (×2): via INTRAVENOUS

## 2015-04-17 MED ORDER — ONDANSETRON HCL 4 MG/2ML IJ SOLN
4.0000 mg | Freq: Once | INTRAMUSCULAR | Status: AC
  Administered 2015-04-17: 4 mg via INTRAVENOUS

## 2015-04-17 MED ORDER — MIDAZOLAM HCL 2 MG/2ML IJ SOLN
INTRAMUSCULAR | Status: DC | PRN
Start: 2015-04-17 — End: 2015-04-17
  Administered 2015-04-17: 2 mg via INTRAVENOUS

## 2015-04-17 SURGICAL SUPPLY — 51 items
APPLIER CLIP ROT 13.4 12 LRG (CLIP)
BANDAGE ELASTIC 6 CLIP NS LF (GAUZE/BANDAGES/DRESSINGS) ×6 IMPLANT
BLADE SURG SZ11 CARB STEEL (BLADE) ×3 IMPLANT
BRONCHOSCOPE PED SLIM DISP (MISCELLANEOUS) ×3 IMPLANT
CANISTER SUCT 1200ML W/VALVE (MISCELLANEOUS) ×3 IMPLANT
CHLORAPREP W/TINT 26ML (MISCELLANEOUS) ×6 IMPLANT
CLIP APPLIE ROT 13.4 12 LRG (CLIP) IMPLANT
DEFOGGER SCOPE WARMER CLEARIFY (MISCELLANEOUS) ×3 IMPLANT
FILTER LAP SMOKE EVAC STRL (MISCELLANEOUS) ×3 IMPLANT
GLOVE BIO SURGEON STRL SZ 6.5 (GLOVE) ×10 IMPLANT
GLOVE BIO SURGEON STRL SZ8 (GLOVE) ×3 IMPLANT
GLOVE BIO SURGEONS STRL SZ 6.5 (GLOVE) ×5
GOWN STRL REUS W/ TWL LRG LVL3 (GOWN DISPOSABLE) ×3 IMPLANT
GOWN STRL REUS W/ TWL XL LVL3 (GOWN DISPOSABLE) ×1 IMPLANT
GOWN STRL REUS W/TWL LRG LVL3 (GOWN DISPOSABLE) ×6
GOWN STRL REUS W/TWL XL LVL3 (GOWN DISPOSABLE) ×2
IRRIGATION STRYKERFLOW (MISCELLANEOUS) ×1 IMPLANT
IRRIGATOR STRYKERFLOW (MISCELLANEOUS) ×3
IV NS 1000ML (IV SOLUTION) ×2
IV NS 1000ML BAXH (IV SOLUTION) ×1 IMPLANT
KIT RM TURNOVER STRD PROC AR (KITS) ×3 IMPLANT
LABEL OR SOLS (LABEL) ×3 IMPLANT
LIQUID BAND (GAUZE/BANDAGES/DRESSINGS) ×3 IMPLANT
NDL INSUFF 14G 150MM VS150000 (NEEDLE) ×3 IMPLANT
NDL SAFETY 22GX1.5 (NEEDLE) ×3 IMPLANT
NS IRRIG 1000ML POUR BTL (IV SOLUTION) IMPLANT
PACK LAP CHOLECYSTECTOMY (MISCELLANEOUS) ×3 IMPLANT
RELOAD BLUE (STAPLE) ×3 IMPLANT
RELOAD STAPLER GOLD 60MM (STAPLE) ×5 IMPLANT
RELOAD STAPLER GREEN 60MM (STAPLE) ×1 IMPLANT
RELOAD STAPLER WHITE 60MM (STAPLE) IMPLANT
SHEARS HARMONIC ACE PLUS 45CM (MISCELLANEOUS) ×3 IMPLANT
SLEEVE ENDOPATH XCEL 5M (ENDOMECHANICALS) ×6 IMPLANT
SLEEVE GASTRECTOMY 36FR VISIGI (MISCELLANEOUS) IMPLANT
SLEEVE GASTRECTOMY 40FR VISIGI (MISCELLANEOUS) ×3 IMPLANT
STAPLER ECHELON BIOABSB 60 FLE (MISCELLANEOUS) ×9 IMPLANT
STAPLER ECHELON LONG 60 440 (INSTRUMENTS) ×3 IMPLANT
STAPLER RELOAD GOLD 60MM (STAPLE) ×15
STAPLER RELOAD GREEN 60MM (STAPLE) ×3
STAPLER RELOAD WHITE 60MM (STAPLE)
SUT DEVICE BRAIDED 0X39 (SUTURE) IMPLANT
SUT DEVICE BRAIDED 2.0X39 (SUTURE) ×6 IMPLANT
SUT VIC AB 4-0 PS2 18 (SUTURE) ×3 IMPLANT
SUT VICRYL/POLYSORB 3.0 (SUTURE) ×9 IMPLANT
SYR 20CC LL (SYRINGE) ×3 IMPLANT
TROCAR SL VERSASTEP 5M LG  B (MISCELLANEOUS) ×2
TROCAR SL VERSASTEP 5M LG B (MISCELLANEOUS) ×1 IMPLANT
TROCAR XCEL 12X100 BLDLESS (ENDOMECHANICALS) ×3 IMPLANT
TROCAR XCEL NON-BLD 5MMX100MML (ENDOMECHANICALS) ×3 IMPLANT
TUBING INSUFFLATOR HEATED (MISCELLANEOUS) ×3 IMPLANT
WATER STERILE IRR 1000ML POUR (IV SOLUTION) ×3 IMPLANT

## 2015-04-17 NOTE — H&P (Signed)
  See scanned H and P all reviewed no changes

## 2015-04-17 NOTE — Op Note (Signed)
Mandy Roberts-MICHAEL 04/17/2015 2:00 PM Primary Procedure: Laparoscopic loop BPD/DS  Secondary procedures:none Assistant: S. Stout PA-C EBL: minimal Complications: none Instrument/needle/pack count: wnl Indication: Morbid Obesity Drains: none Specimens: portion of stomach  Clinical indications:   Mandy Roberts is a 54 y.o. year old African-Americanfemale with a long standing history of morbid obesity who presented to my practice requesting weight loss surgery. She underwent an extensive preoperative evaluation process to include psychological evaluation, nutritional counseling, and labarotory and imaging testing and was deemed an appropriate candidate for surgery. Furthermore, I personally evaluated the patient and all options including gastric bypass, sleeve, and banding, and single or double anastomosis duodenal switch  including death and leak,  were discussed along with their individual risks and benefits. Finally, I personally cautioned the patient that post-operative adherence to our diet plan and exercise were necessary for a safe recovery and to achieve adequate and persistent weight loss.   Details of Procedure:  The patient was taken to the operating room and placed on the table in the supine posisition. Appropriate monitors and oxygen were given by anesthesia. Broad spectrum antibiotics were given per protocol. A time out was performed and acknowledged by all operating room staff. The patient underwent general anesthesia. The abdomen was prepped and draped in the usual sterile fashion.  The abdomen was accessed with a 5 mm trocar in the left upper quadrant without incident. pneumoperitoneum was established without difficulty.  Multiple other ports were placed in preparation for the procedure.  Ileocecal valve was identified and 300 cm of small bowel were measured from that point and marked with an intraoperative marker.  The distal bowel or efferent loop was placed  laterally toward the side of the right colon and marked as well to prevent any twisting of the loop.  This was then sutured to the mesentery of the right upper quadrant to prevent slippage.    Attention was then placed left upper quadrant.  A liver retractor was placed.  The hiatus was explored.  A hiatal hernia was not present.  If present see addendum below.  The greater curvature of the stomach was mobilized the full length from the angle of His all the way down past the pylorus and down to the juncture of the duodenum and the pancreatic head.  Posterior dissection was performed utilizing Harmonic scalpel.  The posterior pancreatic attachments were taken down using the harmonic scalpel as well.  Once the stomach was fully mobilized a sleeve gastrectomy was performed by dividing the antrum with a 60 mm reinforced green load staple fire and then multiple re-fires of a 60 mm stapler (gold load) was performed parallel to the lesser curvature.  A 40 French VisiG was present during this sleeve creation.  The excess stomach was removed via the 15 mm port  without spillage of gastric contents.  The dissection continued to the distal duodenum at the juncture of the pancreatic head and duodenum.  This was carefully dissected using the Harmonic scalpel.  The superior attachments of the duodenum at this level were dissected free creating a window above the duodenum.  Another 60 mm gold reinforced with seamguard stapler was applied across this duodenum at the level of the pancreatic head again with reinforcement.  The duodenum was divided at this juncture.  All staple lines were hemostatic throughout the case.  The loop of ileum marked at 300 cm was then mobilized by dividing the suture and then running a posterior row of 2-0 Polysorb  between the loop and the post-pyloric duodenal stump.  The omentum was not split to accomodate a tensionless anastomosis. An enterotomy was created in the duodenum and the ileum  respectively and a duodenal-ileal anastomosis was created by running 2 separate 3-0 Polysorb suture starting with the posterior row running around the corners using Connell sutures at the corners  and then continuing as a single anterior layer and tied together in the middle.  I then performed a leak test with the afferent and efferent ileal loops clamped and the anastomosis submerged under saline.  Insufflation was performed through the VisiG. A leak was not detected.   The clamps were removed. A 19 [place] Blake drain was inserted through a trocar and placed near the anastomosis. The trochars removed.  The liver retractor was removed.  Hemostasis was excellent. The wounds were closed with 4-0 vicryl and dermabond.  Patient arrived at the recovery room in satisfactory condition.

## 2015-04-17 NOTE — Anesthesia Postprocedure Evaluation (Signed)
  Anesthesia Post-op Note  Patient: Mandy Roberts  Procedure(s) Performed: Procedure(s): LAPAROSCOPIC GASTRIC RESTRICTIVE DUODENAL PROCEDURE, single anastamosis  (N/A) UPPER GI ENDOSCOPY (N/A)  Anesthesia type:General ETT  Patient location: PACU  Post pain: Pain level controlled  Post assessment: Post-op Vital signs reviewed, Patient's Cardiovascular Status Stable, Respiratory Function Stable, Patent Airway and No signs of Nausea or vomiting  Post vital signs: Reviewed and stable  Last Vitals:  Filed Vitals:   04/17/15 1731  BP: 126/65  Pulse: 61  Temp: 36.4 C  Resp: 18    Level of consciousness: awake, alert  and patient cooperative  Complications: No apparent anesthesia complications

## 2015-04-17 NOTE — Anesthesia Preprocedure Evaluation (Addendum)
Anesthesia Evaluation    Airway Mallampati: IV       Dental  (+) Teeth Intact   Pulmonary asthma , sleep apnea ,  breath sounds clear to auscultation        Cardiovascular hypertension, Rhythm:regular     Neuro/Psych    GI/Hepatic   Endo/Other  diabetes  Renal/GU      Musculoskeletal   Abdominal   Peds  Hematology   Anesthesia Other Findings   Reproductive/Obstetrics                            Anesthesia Physical Anesthesia Plan  ASA: III  Anesthesia Plan: General ETT   Post-op Pain Management:    Induction:   Airway Management Planned:   Additional Equipment:   Intra-op Plan:   Post-operative Plan:   Informed Consent: I have reviewed the patients History and Physical, chart, labs and discussed the procedure including the risks, benefits and alternatives for the proposed anesthesia with the patient or authorized representative who has indicated his/her understanding and acceptance.     Plan Discussed with: CRNA  Anesthesia Plan Comments:         Anesthesia Quick Evaluation

## 2015-04-17 NOTE — Transfer of Care (Signed)
Immediate Anesthesia Transfer of Care Note  Patient: Mandy Roberts  Procedure(s) Performed: Procedure(s): LAPAROSCOPIC GASTRIC RESTRICTIVE DUODENAL PROCEDURE, single anastamosis  (N/A) UPPER GI ENDOSCOPY (N/A)  Patient Location: PACU  Anesthesia Type:General  Level of Consciousness: Alert, Awake, Oriented  Airway & Oxygen Therapy: Patient Spontanous Breathing  Post-op Assessment: Report given to RN  Post vital signs: Reviewed and stable  Last Vitals:  Filed Vitals:   04/17/15 1437  BP: 142/86  Pulse: 74  Temp: 36.8 C  Resp: 14    Complications: No apparent anesthesia complications

## 2015-04-17 NOTE — Anesthesia Procedure Notes (Signed)
Procedure Name: Intubation Date/Time: 04/17/2015 12:04 PM Performed by: Eliberto Ivory Pre-anesthesia Checklist: Patient identified, Patient being monitored, Timeout performed, Emergency Drugs available and Suction available Patient Re-evaluated:Patient Re-evaluated prior to inductionOxygen Delivery Method: Circle system utilized Preoxygenation: Pre-oxygenation with 100% oxygen Intubation Type: IV induction and Rapid sequence Ventilation: Mask ventilation without difficulty Laryngoscope Size: 3 and Glidescope Grade View: Grade IV Tube type: Oral Tube size: 7.5 mm Number of attempts: 1 Airway Equipment and Method: Stylet,  Patient positioned with wedge pillow and Video-laryngoscopy Placement Confirmation: ETT inserted through vocal cords under direct vision,  positive ETCO2 and breath sounds checked- equal and bilateral Secured at: 23 cm Tube secured with: Tape Dental Injury: Teeth and Oropharynx as per pre-operative assessment  Difficulty Due To: Difficulty was anticipated, Difficult Airway- due to large tongue, Difficult Airway- due to reduced neck mobility, Difficult Airway-  due to edematous airway and Difficult Airway- due to anterior larynx Future Recommendations: Recommend- induction with short-acting agent, and alternative techniques readily available

## 2015-04-18 ENCOUNTER — Inpatient Hospital Stay: Payer: 59

## 2015-04-18 ENCOUNTER — Encounter: Payer: Self-pay | Admitting: Specialist

## 2015-04-18 LAB — COMPREHENSIVE METABOLIC PANEL
ALBUMIN: 3.6 g/dL (ref 3.5–5.0)
ALT: 41 U/L (ref 14–54)
AST: 31 U/L (ref 15–41)
Alkaline Phosphatase: 56 U/L (ref 38–126)
Anion gap: 6 (ref 5–15)
BUN: 13 mg/dL (ref 6–20)
CO2: 25 mmol/L (ref 22–32)
Calcium: 9 mg/dL (ref 8.9–10.3)
Chloride: 108 mmol/L (ref 101–111)
Creatinine, Ser: 0.84 mg/dL (ref 0.44–1.00)
GFR calc Af Amer: >60 mL/min (ref >60)
GFR calc non Af Amer: >60 mL/min (ref >60)
Glucose, Bld: 157 mg/dL — ABNORMAL HIGH (ref 65–99)
POTASSIUM: 4.2 mmol/L (ref 3.5–5.1)
SODIUM: 139 mmol/L (ref 135–145)
TOTAL PROTEIN: 7.4 g/dL (ref 6.5–8.1)
Total Bilirubin: 0.4 mg/dL (ref 0.3–1.2)

## 2015-04-18 LAB — CBC WITH DIFFERENTIAL/PLATELET
Basophils Absolute: 0 10*3/uL (ref 0–0.1)
Basophils Relative: 0 %
EOS PCT: 0 %
Eosinophils Absolute: 0 10*3/uL (ref 0–0.7)
HEMATOCRIT: 40.7 % (ref 35.0–47.0)
HEMOGLOBIN: 13.1 g/dL (ref 12.0–16.0)
Lymphocytes Relative: 8 %
Lymphs Abs: 1 10*3/uL (ref 1.0–3.6)
MCH: 29.1 pg (ref 26.0–34.0)
MCHC: 32.3 g/dL (ref 32.0–36.0)
MCV: 90.1 fL (ref 80.0–100.0)
Monocytes Absolute: 0.4 10*3/uL (ref 0.2–0.9)
Monocytes Relative: 3 %
NEUTROS ABS: 10.7 10*3/uL — AB (ref 1.4–6.5)
Neutrophils Relative %: 89 %
PLATELETS: 175 10*3/uL (ref 150–440)
RBC: 4.52 MIL/uL (ref 3.80–5.20)
RDW: 13.4 % (ref 11.5–14.5)
WBC: 12.2 10*3/uL — AB (ref 3.6–11.0)

## 2015-04-18 MED ORDER — HYDROCODONE-ACETAMINOPHEN 7.5-325 MG/15ML PO SOLN
15.0000 mL | ORAL | Status: DC | PRN
  Administered 2015-04-18 – 2015-04-19 (×2): 15 mL via ORAL
  Filled 2015-04-18 (×2): qty 15

## 2015-04-18 MED ORDER — IOHEXOL 300 MG/ML  SOLN
100.0000 mL | Freq: Once | INTRAMUSCULAR | Status: AC | PRN
  Administered 2015-04-18: 100 mL via ORAL

## 2015-04-18 MED ORDER — UNJURY CHICKEN SOUP POWDER
2.0000 [oz_av] | Freq: Three times a day (TID) | ORAL | Status: DC
  Administered 2015-04-18: 2 [oz_av] via ORAL

## 2015-04-18 NOTE — Progress Notes (Signed)
Pt was oriented to room environment, family at bedside for support. Abdominal surgical area noted with 6 lap sites all C/D/ I. IVF's infusing without difficulty, Pt reports minimal pain and is ambulating and voiding adequately. Has been NPO since midnight awaiting upper GI for this am. Call light and phone in reach.

## 2015-04-18 NOTE — Progress Notes (Signed)
Mandy Roberts is a 54 y.o. female patient. 1. Obstructive sleep apnea   2. Abdominal pain   3. Left upper quadrant pain    Past Medical History  Diagnosis Date  . Hypertension 2008  . Personal history of malignant neoplasm of breast 1990     left mastectomy   . Cancer 1990     left mastectomy ,had recurrence in left suprclavic nodes several yrs ago-treated with radiation and chemo  . Atrial fibrillation   . Sinusitis   . Diabetes mellitus without complication   . Colon polyp 2008  . Difficult intubation     expectorated blood 3-4 days after surgery in 2011  . Difficult intubation     hypoxia with endoscopy in 2016  . Sleep apnea    Current Facility-Administered Medications  Medication Dose Route Frequency Provider Last Rate Last Dose  . 0.9 %  sodium chloride infusion   Intravenous Continuous Mardelle Matte, PA-C 75 mL/hr at 04/18/15 1606    . enoxaparin (LOVENOX) injection 40 mg  40 mg Subcutaneous Q12H Bonner Puna, MD   40 mg at 04/18/15 0933  . HYDROcodone-acetaminophen (HYCET) 7.5-325 mg/15 ml solution 15 mL  15 mL Oral Q4H PRN Mardelle Matte, PA-C      . morphine 2 MG/ML injection 2-6 mg  2-6 mg Intravenous Q1H PRN Bonner Puna, MD   2 mg at 04/18/15 1606  . ondansetron (ZOFRAN) injection 4 mg  4 mg Intravenous Q4H PRN Bonner Puna, MD   4 mg at 04/18/15 1329  . protein supplement (UNJURY CHICKEN SOUP) powder 2 oz  2 oz Oral TID WC Burlene Arnt, RD   2 oz at 04/18/15 1700  . sotalol (BETAPACE) tablet 80 mg  80 mg Oral BID Bonner Puna, MD   80 mg at 04/18/15 1000   Allergies  Allergen Reactions  . Tdap [Diphth-Acell Pertussis-Tetanus] Shortness Of Breath  . Penicillins Rash   Active Problems:   Morbid obesity  Blood pressure 113/52, pulse 68, temperature 98.4 F (36.9 C), temperature source Oral, resp. rate 18, height 5\' 6"  (1.676 m), weight 147.419 kg (325 lb), SpO2 98 %.  Subjective pt doing well. G/o gas pain. Drinking slowyl Objective vss; abd benign; incisions  c/d/i Assessment & Plan  Encourage PO intake, Incentive spirometry and ambulation Likely DC in am NOTE written/transcribed for Michaelyn Barter PA-C Mardelle Matte 04/18/2015

## 2015-04-18 NOTE — Progress Notes (Signed)
Initial Nutrition Assessment  INTERVENTION:  RD consulted for nutrition education regarding inpatient bariatric surgery.   RD provided "The Liquid Diet" handout from the Bariatric Surgery Guide from the [facility]. This handout previously provided to patient prior to surgery is a duplicate copy. Discussed what foods/liquids are consistent with a Clear Liquid Diet and reinforced Key Concepts such as no carbonation, no caffeine, or sugar containing beverages. Provided methods to prevent dehydration and promote protein intake, using clock and sample fluid schedule. RD encouraged follow-up with outpatient dietitian after discharge.  Teach back method used.  Expect good compliance.  NUTRITION DIAGNOSIS:  Food and nutrition knowledge related deficit related to recent bariatric surgery as evidenced by dietitian consult for nutrition education   GOAL:  Patient will be able to sip and tolerate CL within 24-48 hours  MONITOR:  Energy intake Digestive system  ASSESSMENT:  Pt s/p lap loop biliopancreatic diversion and duodenal switch, with sleeve gastrectomy yesterday. Pt walking on floor prior to visit; s/p upper GI. Awaiting diet order advancement.  Body mass index is 52.48 kg/(m^2).   Current diet order is Bariatric CL with unjury supplement TID, patient diet order just advanced. NPO on visit this am.  Labs and medications reviewed.   LOW Care Level  Dwyane Luo, [place], [place] Pager [phone]

## 2015-04-19 LAB — SURGICAL PATHOLOGY

## 2015-04-19 NOTE — Progress Notes (Signed)
A&O. VSS. Tolerating bariatric clear liquid diet well. No complaints of pain or nausea. Resting comfortably. 6 lap sites intact with dermabond. Ambulating unassisted. Discharged per MD orders. Discharge instructions reviewed with pt and pt verbalized understanding. IV removed per policy. Prescriptions given to pt. Discharged via wheelchair escorted by auxilary.

## 2015-04-19 NOTE — Discharge Summary (Signed)
Physician Discharge Summary  Patient ID: ERIKO ECONOMOS MRN: [id number] DOB/AGE: [id number] 54 y.o.  Admit date: 04/17/2015 Discharge date: 04/19/2015  Admission Diagnoses:  Discharge Diagnoses:  Active Problems:   Morbid obesity   Discharged Condition: good  Hospital Course: noissues advanced liquids without difficulty UGI negative ambulating  Consults: None  Significant Diagnostic Studies: radiology: UGI negative  Treatments: surgery: laparoscopic bilopancreatic diversion and duodenal switch  Discharge Exam: Blood pressure 121/50, pulse 84, temperature 98.6 F (37 C), temperature source Oral, resp. rate 16, height 5\' 6"  (1.676 m), weight 147.419 kg (325 lb), SpO2 94 %. General appearance: alert and appears stated age  Disposition: Final discharge disposition not confirmed  Discharge Instructions    Ambulate hourly while awake    Complete by:  As directed      Call MD for:  difficulty breathing, headache or visual disturbances    Complete by:  As directed      Call MD for:  difficulty breathing, headache or visual disturbances    Complete by:  As directed      Call MD for:  extreme fatigue    Complete by:  As directed      Call MD for:  hives    Complete by:  As directed      Call MD for:  persistant dizziness or light-headedness    Complete by:  As directed      Call MD for:  persistant dizziness or light-headedness    Complete by:  As directed      Call MD for:  persistant nausea and vomiting    Complete by:  As directed      Call MD for:  persistant nausea and vomiting    Complete by:  As directed      Call MD for:  redness, tenderness, or signs of infection (pain, swelling, redness, odor or green/yellow discharge around incision site)    Complete by:  As directed      Call MD for:  redness, tenderness, or signs of infection (pain, swelling, redness, odor or green/yellow discharge around incision site)    Complete by:  As directed      Call MD for:  severe  uncontrolled pain    Complete by:  As directed      Call MD for:  severe uncontrolled pain    Complete by:  As directed      Call MD for:  temperature >100.4    Complete by:  As directed      Call MD for:  temperature >101 F    Complete by:  As directed      Diet bariatric full liquid    Complete by:  As directed      Discharge instructions    Complete by:  As directed   Remember to start your chewable or liquid B complex once you arrive home and take this daily for 30 days. You may continue to take this beyond 30 days if you choose. Stick with a Clear liquid diet for 2 days post operatively then you may advance to a Full Liquid diet for 12 days, which means you will be on a strict liquid diet for 14 days. Your daily goal is going to be to get in 60-80g of protein and 64oz of fluid. One week after surgery start Eliquis and stop Lovenox injections.     Driving Restrictions    Complete by:  As directed   You may not drive until 24 hours past  your last dose of pain medications.     Driving Restrictions    Complete by:  As directed   Don't drive until off of narcotics 24 hours     Incentive spirometry    Complete by:  As directed   Perform hourly while awake     Increase activity slowly    Complete by:  As directed      Lifting restrictions    Complete by:  As directed   Do not lift more than 10-15 pounds for 4-6 weeks post operatively     Lifting restrictions    Complete by:  As directed   15 pounds     No dressing needed    Complete by:  As directed      No wound care    Complete by:  As directed      Other Restrictions    Complete by:  As directed   Avoid using your core muscles for 30 days after surgery - motions like pushing, pulling, climbing etc. Make sure to get up and walk frequently every day to help avoid developing blood clots.            Medication List    STOP taking these medications        apixaban 5 MG Tabs tablet  Commonly known as:  ELIQUIS      glimepiride 1 MG tablet  Commonly known as:  AMARYL     ibuprofen 200 MG tablet  Commonly known as:  ADVIL,MOTRIN     lisinopril-hydrochlorothiazide 10-12.5 MG per tablet  Commonly known as:  PRINZIDE,ZESTORETIC      TAKE these medications        enoxaparin 40 MG/0.4ML injection  Commonly known as:  LOVENOX  Inject 0.4 mLs (40 mg total) into the skin every 12 (twelve) hours. 1 week post op resume eliquis and discontinue Lovenox     HYDROcodone-acetaminophen 7.5-325 mg/15 ml solution  Commonly known as:  HYCET  Take 15 mLs by mouth 4 (four) times daily as needed for moderate pain.     ondansetron 4 MG disintegrating tablet  Commonly known as:  ZOFRAN ODT  Take 1 tablet (4 mg total) by mouth every 4 (four) hours as needed for nausea or vomiting.     sotalol 80 MG tablet  Commonly known as:  BETAPACE  Take 80 mg by mouth 2 (two) times daily.     Vitamin D (Ergocalciferol) 50000 UNITS Caps capsule  Commonly known as:  DRISDOL  Take 50,000 Units by mouth every 7 (seven) days.         SignedCarmelina Noun 04/19/2015, 10:22 AM

## 2015-05-07 NOTE — Addendum Note (Signed)
Addendum  created 05/07/15 1014 by Molli Barrows, MD   Modules edited: Anesthesia Events, Narrator   Narrator:  Narrator: Event Log Edited

## 2015-05-08 ENCOUNTER — Encounter: Payer: 59 | Attending: Specialist | Admitting: Dietician

## 2015-05-08 ENCOUNTER — Encounter: Payer: Self-pay | Admitting: Dietician

## 2015-05-08 DIAGNOSIS — E669 Obesity, unspecified: Secondary | ICD-10-CM | POA: Insufficient documentation

## 2015-05-08 NOTE — Progress Notes (Signed)
Medical Nutrition Therapy Follow-up visit:  Post-bariatric surgery nutrition evaluation  Time:15:00-16:00pm Visit #:4 ASSESSMENT:  Diagnosis:obesity  Current weight:304.2lbs    Height:66in Medications: See list  Progress and evaluation: Pt. Reports she had bariatric "SIPS" surgery 3 weeks ago. She reports the only diet related problem has been nausea after eating "something I shouldn't have". She is drinking 2 Premier shakes daily + 1/2 of a protein "shot" for total of 81 grams of protein or she drinks 1 Premier shake + 1 protein "shot" for total of 72 gms protein daily. She has difficulty reaching the goal of 64 oz. Fluids daily. She states her biggest challenge has been to wait 30 minutes before and after eating. She has tried mashed potatoes, 1-2 bites of white fish and 1-2 bites of [employer] chili and tolerated. She experienced "dumping syndrome" after eating watermelon.  Physical activity:none   NUTRITION CARE EDUCATION: Post -Bariatric Surgery diet/nutrition guidelines: Instructed on introduction of soft foods with continued emphasis on adequate protein and fluid intake. Also, reviewed vitamin supplementation needs. Discussed the importance of exercise once MD has given "ok" to help increase metabolism long-term. Discussed possible need to drink the 2 Premier shakes every day to help with fluid intake in addition to meeting protein needs.  INTERVENTION:  Strive for 64 oz. Fluid per day. Can begin soft foods next week. Start with soft scrambled eggs. Try soft cooked white fish and soft cooked chicken. Wait to try soft ground beef to see how tolerates fish and chicken. Continue with goal of 80-100 gms protein. Start additional vitamin supplements at 4 weeks post-surgery that are on the list.   EDUCATION MATERIALS GIVEN:  . Bariatric surgery guidelines and diet . Sample meal pattern/ menus . Goals/ instructions  LEARNER/ who was taught:  . Patient  . Spouse/ partner  LEVEL OF  UNDERSTANDING: . Verbalizes/ demonstrates competency LEARNING BARRIERS: . None WILLINGNESS TO LEARN/READINESS FOR CHANGE: . Eager, change in progress  MONITORING AND EVALUATION:  No follow-up scheduled. Patient was encouraged to call if desires further help with diet/nutrition.

## 2015-05-08 NOTE — Patient Instructions (Signed)
Strive for 64 oz. Fluid per day. Can begin soft foods next week. Start with soft scrambled eggs. Try soft cooked white fish and soft cooked chicken. Wait to try soft ground beef to see how tolerates fish and chicken. Continue with goal of 80-100 gms protein. Start additional vitamin supplements at 4 weeks post-surgery that are on the list.

## 2015-05-30 ENCOUNTER — Telehealth: Payer: Self-pay | Admitting: *Deleted

## 2015-05-30 NOTE — Telephone Encounter (Signed)
Called to check on pt post gastric surgery, per Dr. Jamal Collin. She states she is "coming along well". She is down 42 pounds. She is still dealing with some "reflux" issues at night but overall, doing good. Appreciates phone call.

## 2015-07-09 ENCOUNTER — Other Ambulatory Visit: Payer: Self-pay

## 2015-07-09 DIAGNOSIS — Z1231 Encounter for screening mammogram for malignant neoplasm of breast: Secondary | ICD-10-CM

## 2015-09-21 ENCOUNTER — Ambulatory Visit
Admission: RE | Admit: 2015-09-21 | Discharge: 2015-09-21 | Disposition: A | Discharge status: Home / Self Care | Payer: 59 | Source: Ambulatory Visit | Attending: General Surgery | Admitting: General Surgery

## 2015-09-21 ENCOUNTER — Other Ambulatory Visit: Payer: Self-pay | Admitting: General Surgery

## 2015-09-21 DIAGNOSIS — Z1231 Encounter for screening mammogram for malignant neoplasm of breast: Secondary | ICD-10-CM | POA: Diagnosis not present

## 2015-09-21 DIAGNOSIS — Z853 Personal history of malignant neoplasm of breast: Secondary | ICD-10-CM | POA: Diagnosis not present

## 2015-09-21 DIAGNOSIS — Z9012 Acquired absence of left breast and nipple: Secondary | ICD-10-CM | POA: Insufficient documentation

## 2015-09-21 HISTORY — DX: Malignant neoplasm of unspecified site of unspecified female breast: C50.919

## 2015-10-01 ENCOUNTER — Ambulatory Visit (INDEPENDENT_AMBULATORY_CARE_PROVIDER_SITE_OTHER): Payer: 59 | Admitting: General Surgery

## 2015-10-01 ENCOUNTER — Encounter: Payer: Self-pay | Admitting: General Surgery

## 2015-10-01 VITALS — BP 132/76 | HR 76 | Resp 14 | Ht 66.0 in | Wt 247.0 lb

## 2015-10-01 DIAGNOSIS — Z853 Personal history of malignant neoplasm of breast: Secondary | ICD-10-CM

## 2015-10-01 NOTE — Progress Notes (Signed)
Patient ID: Mandy Roberts, female   DOB: 1961-05-10, 54 y.o.   MRN: [id number]  Chief Complaint  Patient presents with  . Follow-up    mammogram    HPI Mandy Roberts is a 54 y.o. female who presents for a breast cancer follow up. The most recent mammogram was done on 09/21/15.  Patient does perform regular self breast checks and gets regular mammograms done. No new breast issues.  5 months post gastric bypass(duodenal switch) with 82 lb weight loss so far, improvements in BP as well as diabetes control since then. No longer needing CPAP at night as well.  I have reviewed the history of present illness with the patient.  HPI  Past Medical History  Diagnosis Date  . Hypertension 2008  . Personal history of malignant neoplasm of breast 1990     left mastectomy   . Cancer [facility]) 1990     left mastectomy ,had recurrence in left suprclavic nodes several yrs ago-treated with radiation and chemo  . Atrial fibrillation ([place])   . Sinusitis   . Diabetes mellitus without complication ([place])   . Colon polyp 2008  . Difficult intubation     expectorated blood 3-4 days after surgery in 2011  . Difficult intubation     hypoxia with endoscopy in 2016  . Sleep apnea   . Breast cancer [facility]) 1990/1997    Past Surgical History  Procedure Laterality Date  . Colonoscopy  2008    Dr. Jamal Collin  . Knee surgery Left 2009, 2010, 2012  . Mastectomy  1990    left   . Abdominal hysterectomy  1996  . Dilation and curettage of uterus    . Tonsillectomy    . Knee arthroscopy Bilateral   . Laparoscopic gastric restrictive duodenal procedure (duodenal switch) N/A 04/17/2015    Procedure: LAPAROSCOPIC GASTRIC RESTRICTIVE DUODENAL PROCEDURE, single anastamosis ;  Surgeon: Bonner Puna, MD;  Location: [facility] ORS;  Service: General;  Laterality: N/A;  . Upper gi endoscopy N/A 04/17/2015    Procedure: UPPER GI ENDOSCOPY;  Surgeon: Bonner Puna, MD;  Location: [facility] ORS;  Service: General;  Laterality: N/A;     Family History  Problem Relation Age of Onset  . Breast cancer    . Heart failure Mother   . Hypertension Mother   . Hyperlipidemia Mother   . Kidney disease Mother   . CVA Mother   . Hypothyroidism Mother   . Breast cancer Maternal Aunt     Social History Social History  Substance Use Topics  . Smoking status: Never Smoker   . Smokeless tobacco: Never Used  . Alcohol Use: No    Allergies  Allergen Reactions  . Tdap [Diphth-Acell Pertussis-Tetanus] Shortness Of Breath  . Penicillins Rash    Current Outpatient Prescriptions  Medication Sig Dispense Refill  . b complex vitamins tablet Take 1 tablet by mouth daily.    . ondansetron (ZOFRAN ODT) 4 MG disintegrating tablet Take 1 tablet (4 mg total) by mouth every 4 (four) hours as needed for nausea or vomiting. 20 tablet 0  . sotalol (BETAPACE) 80 MG tablet Take 80 mg by mouth 2 (two) times daily.   2  . Vitamin D, Ergocalciferol, (DRISDOL) 50000 UNITS CAPS capsule Take 50,000 Units by mouth every 7 (seven) days.     No current facility-administered medications for this visit.    Review of Systems Review of Systems  Constitutional: Negative.   Respiratory: Negative.   Cardiovascular: Negative.  Blood pressure 132/76, pulse 76, resp. rate 14, height 5\' 6"  (1.676 m), weight 247 lb (112.038 kg).  Physical Exam Physical Exam  Constitutional: She is oriented to person, place, and time. She appears well-developed and well-nourished.  Eyes: Conjunctivae are normal. No scleral icterus.  Neck: Neck supple.  Cardiovascular: Normal rate, regular rhythm and normal heart sounds.   Pulmonary/Chest: Effort normal and breath sounds normal. Right breast exhibits no inverted nipple, no mass, no nipple discharge, no skin change and no tenderness.  Left mastectomy site well healed, no signs of recurrence.   Abdominal: Soft. Bowel sounds are normal. There is no hepatomegaly. There is no tenderness.  Lymphadenopathy:    She has  no cervical adenopathy.    She has no axillary adenopathy.  Neurological: She is alert and oriented to person, place, and time.  Skin: Skin is warm and dry.    Data Reviewed Mammogram reviewed, no suspicious findings  Assessment    Stable exam,  History of left breast cancer post left mastectomy and chemotherapy. 5 months post gastric bypass and doing well.     Plan    Right breast screening mammogram in 1 year.      PCP: Eugene Gavia G 10/01/2015, 10:06 AM

## 2015-10-01 NOTE — Patient Instructions (Signed)
Continue self breast exams. Call office for any new breast issues or concerns. Screening mammogram right breast in 1 year.  Call if any concerns arise.

## 2015-10-10 ENCOUNTER — Telehealth: Payer: Self-pay | Admitting: *Deleted

## 2015-10-10 NOTE — Telephone Encounter (Signed)
Patient wants to get a prescription for Prothesis and Bras. She is wanting to come to the office to pick it up.

## 2015-11-30 DIAGNOSIS — R7989 Other specified abnormal findings of blood chemistry: Secondary | ICD-10-CM | POA: Insufficient documentation

## 2015-12-04 DIAGNOSIS — K912 Postsurgical malabsorption, not elsewhere classified: Secondary | ICD-10-CM | POA: Diagnosis not present

## 2015-12-04 DIAGNOSIS — E509 Vitamin A deficiency, unspecified: Secondary | ICD-10-CM | POA: Diagnosis not present

## 2015-12-04 DIAGNOSIS — E559 Vitamin D deficiency, unspecified: Secondary | ICD-10-CM | POA: Diagnosis not present

## 2015-12-04 DIAGNOSIS — Z9884 Bariatric surgery status: Secondary | ICD-10-CM | POA: Diagnosis not present

## 2015-12-04 DIAGNOSIS — E538 Deficiency of other specified B group vitamins: Secondary | ICD-10-CM | POA: Diagnosis not present

## 2015-12-04 DIAGNOSIS — R778 Other specified abnormalities of plasma proteins: Secondary | ICD-10-CM | POA: Diagnosis not present

## 2016-02-19 DIAGNOSIS — K912 Postsurgical malabsorption, not elsewhere classified: Secondary | ICD-10-CM | POA: Diagnosis not present

## 2016-02-19 DIAGNOSIS — Z9884 Bariatric surgery status: Secondary | ICD-10-CM | POA: Diagnosis not present

## 2016-04-18 DIAGNOSIS — E668 Other obesity: Secondary | ICD-10-CM | POA: Diagnosis not present

## 2016-04-18 DIAGNOSIS — Z9884 Bariatric surgery status: Secondary | ICD-10-CM | POA: Diagnosis not present

## 2016-04-18 DIAGNOSIS — K912 Postsurgical malabsorption, not elsewhere classified: Secondary | ICD-10-CM | POA: Diagnosis not present

## 2016-04-18 DIAGNOSIS — I4891 Unspecified atrial fibrillation: Secondary | ICD-10-CM | POA: Diagnosis not present

## 2016-04-22 ENCOUNTER — Inpatient Hospital Stay: Payer: 59 | Attending: Internal Medicine | Admitting: Internal Medicine

## 2016-04-22 ENCOUNTER — Encounter: Payer: Self-pay | Admitting: Internal Medicine

## 2016-04-22 VITALS — BP 127/94 | HR 66 | Temp 97.8°F | Resp 18 | Wt 232.1 lb

## 2016-04-22 DIAGNOSIS — Z9221 Personal history of antineoplastic chemotherapy: Secondary | ICD-10-CM | POA: Diagnosis not present

## 2016-04-22 DIAGNOSIS — Z853 Personal history of malignant neoplasm of breast: Secondary | ICD-10-CM | POA: Insufficient documentation

## 2016-04-22 DIAGNOSIS — E119 Type 2 diabetes mellitus without complications: Secondary | ICD-10-CM | POA: Diagnosis not present

## 2016-04-22 DIAGNOSIS — Z79899 Other long term (current) drug therapy: Secondary | ICD-10-CM | POA: Insufficient documentation

## 2016-04-22 DIAGNOSIS — I10 Essential (primary) hypertension: Secondary | ICD-10-CM | POA: Insufficient documentation

## 2016-04-22 DIAGNOSIS — Z803 Family history of malignant neoplasm of breast: Secondary | ICD-10-CM | POA: Insufficient documentation

## 2016-04-22 DIAGNOSIS — Z9884 Bariatric surgery status: Secondary | ICD-10-CM | POA: Insufficient documentation

## 2016-04-22 DIAGNOSIS — Z923 Personal history of irradiation: Secondary | ICD-10-CM | POA: Insufficient documentation

## 2016-04-22 DIAGNOSIS — Z86010 Personal history of colonic polyps: Secondary | ICD-10-CM | POA: Insufficient documentation

## 2016-04-22 DIAGNOSIS — I4891 Unspecified atrial fibrillation: Secondary | ICD-10-CM | POA: Insufficient documentation

## 2016-04-22 DIAGNOSIS — R7989 Other specified abnormal findings of blood chemistry: Secondary | ICD-10-CM | POA: Diagnosis not present

## 2016-04-22 DIAGNOSIS — M129 Arthropathy, unspecified: Secondary | ICD-10-CM | POA: Diagnosis not present

## 2016-04-22 DIAGNOSIS — G4730 Sleep apnea, unspecified: Secondary | ICD-10-CM | POA: Insufficient documentation

## 2016-04-22 DIAGNOSIS — Z9010 Acquired absence of unspecified breast and nipple: Secondary | ICD-10-CM | POA: Insufficient documentation

## 2016-04-22 DIAGNOSIS — E669 Obesity, unspecified: Secondary | ICD-10-CM | POA: Insufficient documentation

## 2016-04-22 NOTE — Progress Notes (Signed)
[place] NOTE  Patient Care Team: Perrin Maltese, MD as PCP - General (Internal Medicine) Seeplaputhur Robinette Haines, MD (General Surgery)  CHIEF COMPLAINTS/PURPOSE OF CONSULTATION:   # ELEVATED FERRITIN [Dec 2016- 857]; May 2017- 293 [N-250s]  # LEFT breast cancer status post mastectomy [1990; Dr.Choksi/ Dr.Sankar] s/p CAF/ tam- Anastrazole; 2007 Left supraclav recurrence-s/p RT [[facility]]  # Obesity/ duodenal switch May 2016/ Hx A.fib  HISTORY OF PRESENTING ILLNESS:  Mandy Roberts 55 y.o.  female very pleasant African-American patient is referred was for further evaluation of elevated ferritin.   Patient is chronic arthritic symptoms which is not any worse. She has a history of fatty liver which improved status post weight loss surgery. She denies any ongoing history of diabetes. No skin rash. Her appetite is fair. No unusual weight loss or cough or shortness of breath. No lumps or bumps.   ROS: A complete 10 point review of system is done which is negative except mentioned above in history of present illness  MEDICAL HISTORY:  Past Medical History  Diagnosis Date  . Hypertension 2008  . Personal history of malignant neoplasm of breast 1990     left mastectomy   . Cancer [facility]) 1990     left mastectomy ,had recurrence in left suprclavic nodes several yrs ago-treated with radiation and chemo  . Atrial fibrillation ([place])   . Sinusitis   . Diabetes mellitus without complication ([place])   . Colon polyp 2008  . Difficult intubation     expectorated blood 3-4 days after surgery in 2011  . Difficult intubation     hypoxia with endoscopy in 2016  . Sleep apnea   . Breast cancer ([place]) 1990/1997  . History of radiation therapy     radiation treat at [facility]  . History of chemotherapy     5-FU/AC in 1990 under the care of Dr. Oliva Bustard. pt also tx with tamoxifen and arimidex    SURGICAL HISTORY: Past Surgical History  Procedure Laterality Date  . Colonoscopy  2008   Dr. Jamal Collin  . Knee surgery Left 2009, 2010, 2012  . Mastectomy  1990    left   . Abdominal hysterectomy  1996  . Dilation and curettage of uterus    . Tonsillectomy    . Knee arthroscopy Bilateral   . Laparoscopic gastric restrictive duodenal procedure (duodenal switch) N/A 04/17/2015    Procedure: LAPAROSCOPIC GASTRIC RESTRICTIVE DUODENAL PROCEDURE, single anastamosis ;  Surgeon: Bonner Puna, MD;  Location: [facility] ORS;  Service: General;  Laterality: N/A;  . Upper gi endoscopy N/A 04/17/2015    Procedure: UPPER GI ENDOSCOPY;  Surgeon: Bonner Puna, MD;  Location: [facility] ORS;  Service: General;  Laterality: N/A;    SOCIAL HISTORY: Social History   Social History  . Marital Status: Married    Spouse Name: N/A  . Number of Children: N/A  . Years of Education: N/A   Occupational History  . Not on file.   Social History Main Topics  . Smoking status: Never Smoker   . Smokeless tobacco: Never Used  . Alcohol Use: No  . Drug Use: No  . Sexual Activity: Not on file   Other Topics Concern  . Not on file   Social History Narrative    FAMILY HISTORY: Family History  Problem Relation Age of Onset  . Breast cancer    . Heart failure Mother   . Hypertension Mother   . Hyperlipidemia Mother   . Kidney disease  Mother   . CVA Mother   . Hypothyroidism Mother   . Breast cancer Maternal Aunt     ALLERGIES:  is allergic to tdap and penicillins.  MEDICATIONS:  Current Outpatient Prescriptions  Medication Sig Dispense Refill  . b complex vitamins tablet Take 1 tablet by mouth daily.    . folic acid (FOLVITE) 1 MG tablet Take by mouth.    . furosemide (LASIX) 20 MG tablet Take 20 mg by mouth.    Marland Kitchen ibuprofen (ADVIL,MOTRIN) 800 MG tablet Take 800 mg by mouth every 8 (eight) hours as needed.     No current facility-administered medications for this visit.      Marland Kitchen  PHYSICAL EXAMINATION:   Filed Vitals:   04/22/16 1347  BP: 127/94  Pulse: 66  Temp: 97.8 F (36.6 C)  Resp: 18    Filed Weights   04/22/16 1347  Weight: 232 lb 2.3 oz (105.3 kg)    GENERAL: Well-nourished well-developed; Alert, no distress and comfortable.   Accompanied by her husband. EYES: no pallor or icterus OROPHARYNX: no thrush or ulceration; good dentition  NECK: supple, no masses felt LYMPH:  no palpable lymphadenopathy in the cervical, axillary or inguinal regions LUNGS: clear to auscultation and  No wheeze or crackles HEART/CVS: regular rate & rhythm and no murmurs; No lower extremity edema ABDOMEN: abdomen soft, non-tender and normal bowel sounds Musculoskeletal:no cyanosis of digits and no clubbing  PSYCH: alert & oriented x 3 with fluent speech NEURO: no focal motor/sensory deficits SKIN:    LABORATORY DATA:  I have reviewed the data as listed Lab Results  Component Value Date   WBC 12.2* 04/18/2015   HGB 13.1 04/18/2015   HCT 40.7 04/18/2015   MCV 90.1 04/18/2015   PLT 175 04/18/2015   No results for input(s): NA, K, CL, CO2, GLUCOSE, BUN, CREATININE, CALCIUM, GFRNONAA, GFRAA, PROT, ALBUMIN, AST, ALT, ALKPHOS, BILITOT, BILIDIR, IBILI in the last 8760 hours.   ASSESSMENT & PLAN:   # ELEVATED FERRITIN- Incidental- question hemochromatosis clinically less likely. Recommend checking CBC and iron studies ferritin; also hemochromatosis genotyping in approximately 6 months.  # Breast cancer recurrent status post radiation left supraclavicular region. Clinically no evidence of recurrence. Patient will continue follow-up with Dr. Jamal Collin.  Thank you Dr.Khan for allowing me to participate in the care of your pleasant patient. Please do not hesitate to contact me with questions or concerns in the interim.  # 30 minutes face-to-face with the patient discussing the above plan of care; more than 50% of time spent on counseling and coordination.    Cammie Sickle, MD 04/22/2016 2:12 PM

## 2016-04-22 NOTE — Progress Notes (Signed)
Patient here today as new evaluation regarding elevated ferritin.  Referred by Dr. Kreg Shropshire

## 2016-04-22 NOTE — Progress Notes (Signed)
Pt here today for hematology referral for elevated ferritin level. Pt provided additional lab records from [facility].  These labs were drawn on Friday, 04/18/16.    On a clinical note, the patient states that she was previously treated for breast cancer in 1990 and again in 1997.  She was treated with 5-FU/AC in 1990 under the care of Dr. Oliva Bustard. She received radiation treatments at [facility].  pt also tx with tamoxifen and arimidex.  Pt's last visit with Dr. Jamal Collin was in October 2016.

## 2016-04-23 ENCOUNTER — Ambulatory Visit: Payer: 59 | Admitting: Internal Medicine

## 2016-05-06 DIAGNOSIS — S92514A Nondisplaced fracture of proximal phalanx of right lesser toe(s), initial encounter for closed fracture: Secondary | ICD-10-CM | POA: Diagnosis not present

## 2016-05-06 DIAGNOSIS — M79671 Pain in right foot: Secondary | ICD-10-CM | POA: Diagnosis not present

## 2016-07-11 DIAGNOSIS — K7460 Unspecified cirrhosis of liver: Secondary | ICD-10-CM | POA: Diagnosis not present

## 2016-07-24 DIAGNOSIS — I10 Essential (primary) hypertension: Secondary | ICD-10-CM | POA: Diagnosis not present

## 2016-07-24 DIAGNOSIS — M797 Fibromyalgia: Secondary | ICD-10-CM | POA: Diagnosis not present

## 2016-07-30 ENCOUNTER — Other Ambulatory Visit: Payer: Self-pay | Admitting: General Surgery

## 2016-07-30 DIAGNOSIS — Z1231 Encounter for screening mammogram for malignant neoplasm of breast: Secondary | ICD-10-CM

## 2016-08-12 ENCOUNTER — Other Ambulatory Visit: Payer: Self-pay | Admitting: *Deleted

## 2016-08-12 DIAGNOSIS — Z853 Personal history of malignant neoplasm of breast: Secondary | ICD-10-CM

## 2016-08-18 ENCOUNTER — Encounter: Payer: Self-pay | Admitting: *Deleted

## 2016-09-22 ENCOUNTER — Other Ambulatory Visit: Payer: Self-pay | Admitting: General Surgery

## 2016-09-22 ENCOUNTER — Ambulatory Visit
Admission: RE | Admit: 2016-09-22 | Discharge: 2016-09-22 | Disposition: A | Discharge status: Home / Self Care | Payer: 59 | Source: Ambulatory Visit | Attending: General Surgery | Admitting: General Surgery

## 2016-09-22 DIAGNOSIS — Z853 Personal history of malignant neoplasm of breast: Secondary | ICD-10-CM | POA: Diagnosis not present

## 2016-09-22 DIAGNOSIS — Z1231 Encounter for screening mammogram for malignant neoplasm of breast: Secondary | ICD-10-CM | POA: Diagnosis not present

## 2016-09-22 HISTORY — DX: Personal history of antineoplastic chemotherapy: Z92.21

## 2016-09-22 HISTORY — DX: Personal history of irradiation: Z92.3

## 2016-09-29 ENCOUNTER — Encounter: Payer: Self-pay | Admitting: General Surgery

## 2016-09-29 ENCOUNTER — Ambulatory Visit (INDEPENDENT_AMBULATORY_CARE_PROVIDER_SITE_OTHER): Payer: 59 | Admitting: General Surgery

## 2016-09-29 VITALS — BP 130/72 | HR 74 | Resp 12 | Ht 66.0 in | Wt 239.0 lb

## 2016-09-29 DIAGNOSIS — Z853 Personal history of malignant neoplasm of breast: Secondary | ICD-10-CM | POA: Diagnosis not present

## 2016-09-29 NOTE — Patient Instructions (Signed)
Right breast screening mammogram in 1 year.

## 2016-09-29 NOTE — Progress Notes (Signed)
Patient ID: Mandy Roberts, female   DOB: 03-14-1961, 55 y.o.   MRN: [id number]  Chief Complaint  Patient presents with  . Follow-up    mammogram    HPI Mandy Roberts is a 55 y.o. female who presents for a breast cancer follow up. The most recent mammogram was done on 09/22/16.Marland Kitchen  Patient does perform regular self breast checks and gets regular mammograms done.  No breast symptoms, no problems with left mastectomy site. She does get eczema over left left chest wall during fall mos. I have reviewed the history of present illness with the patient.  HPI  Past Medical History:  Diagnosis Date  . Atrial fibrillation ([place])   . Breast cancer ([place]) 1990/1997    left mastectomy ,had recurrence in left suprclavic nodes several yrs ago-treated with radiation and chemo  . Colon polyp 2008  . Diabetes mellitus without complication ([place])   . Difficult intubation    expectorated blood 3-4 days after surgery in 2011  . Difficult intubation    hypoxia with endoscopy in 2016  . History of chemotherapy    5-FU/AC in 1990 under the care of Dr. Oliva Bustard. pt also tx with tamoxifen and arimidex  . History of radiation therapy    radiation treat at [facility]  . Hypertension 2008  . Personal history of chemotherapy 1990   BREAST CA  . Personal history of malignant neoplasm of breast 1990    left mastectomy   . Personal history of radiation therapy 1997   BREAST CA  . Sinusitis   . Sleep apnea     Past Surgical History:  Procedure Laterality Date  . ABDOMINAL HYSTERECTOMY  1996  . COLONOSCOPY  2008   Dr. Jamal Collin  . DILATION AND CURETTAGE OF UTERUS    . KNEE ARTHROSCOPY Bilateral   . KNEE SURGERY Left 2009, 2010, 2012  . LAPAROSCOPIC GASTRIC RESTRICTIVE DUODENAL PROCEDURE (DUODENAL SWITCH) N/A 04/17/2015   Procedure: LAPAROSCOPIC GASTRIC RESTRICTIVE DUODENAL PROCEDURE, single anastamosis ;  Surgeon: Bonner Puna, MD;  Location: [facility] ORS;  Service: General;  Laterality: N/A;  . MASTECTOMY Left 1990    BREAST CA  . TONSILLECTOMY    . UPPER GI ENDOSCOPY N/A 04/17/2015   Procedure: UPPER GI ENDOSCOPY;  Surgeon: Bonner Puna, MD;  Location: [facility] ORS;  Service: General;  Laterality: N/A;    Family History  Problem Relation Age of Onset  . Heart failure Mother   . Hypertension Mother   . Hyperlipidemia Mother   . Kidney disease Mother   . CVA Mother   . Hypothyroidism Mother   . Breast cancer Maternal Aunt 76    Social History Social History  Substance Use Topics  . Smoking status: Never Smoker  . Smokeless tobacco: Never Used  . Alcohol use No    Allergies  Allergen Reactions  . Tdap [Diphth-Acell Pertussis-Tetanus] Shortness Of Breath  . Penicillins Rash    Current Outpatient Prescriptions  Medication Sig Dispense Refill  . b complex vitamins tablet Take 1 tablet by mouth daily.    . folic acid (FOLVITE) 1 MG tablet Take by mouth.    . furosemide (LASIX) 20 MG tablet Take 20 mg by mouth.    Marland Kitchen ibuprofen (ADVIL,MOTRIN) 800 MG tablet Take 800 mg by mouth every 8 (eight) hours as needed.     No current facility-administered medications for this visit.     Review of Systems Review of Systems  Constitutional: Negative.   Respiratory: Negative.   Cardiovascular:  Negative.     Blood pressure 130/72, pulse 74, resp. rate 12, height 5\' 6"  (1.676 m), weight 239 lb (108.4 kg).  Physical Exam Physical Exam  Constitutional: She is oriented to person, place, and time. She appears well-developed and well-nourished.  Eyes: Conjunctivae are normal. No scleral icterus.  Neck: Neck supple. No thyromegaly present.  Cardiovascular: Normal rate, regular rhythm and normal heart sounds.   Pulmonary/Chest: Effort normal and breath sounds normal. Right breast exhibits no inverted nipple, no mass, no nipple discharge, no skin change and no tenderness.    Abdominal: Soft. Bowel sounds are normal. She exhibits no mass. There is no hepatomegaly. There is no tenderness. No hernia.   Lymphadenopathy:    She has no cervical adenopathy.    She has no axillary adenopathy.  Neurological: She is alert and oriented to person, place, and time.  Skin: Skin is warm and dry.    Data Reviewed Mammogram reviewed-stable  Assessment    Stable exam. History of left breast cancer, recurrence 62yrs later. Now 58yrs out and doing well. S/p gastric bypass, lost 100lbs. More important she if meds for HTN and diabtes     Plan   CA 27-29 requested  Right breast screening mammogram in 1 year.  This information has been scribed by Gaspar Cola CMA.        Foye Haggart G 09/29/2016, 10:07 AM

## 2016-09-30 ENCOUNTER — Telehealth: Payer: Self-pay | Admitting: *Deleted

## 2016-09-30 LAB — CANCER ANTIGEN 27.29: CA 27.29: 9.9 U/mL (ref 0.0–38.6)

## 2016-09-30 NOTE — Telephone Encounter (Signed)
Notified patient as instructed, patient pleased. Discussed follow-up appointments, patient agrees  

## 2016-09-30 NOTE — Telephone Encounter (Signed)
-----   Message from Christene Lye, MD sent at 09/30/2016  9:00 AM EST ----- Normal value. Please inform pt.

## 2016-10-13 ENCOUNTER — Inpatient Hospital Stay: Payer: 59

## 2016-10-20 ENCOUNTER — Inpatient Hospital Stay: Payer: 59 | Admitting: Internal Medicine

## 2016-11-19 DIAGNOSIS — Z853 Personal history of malignant neoplasm of breast: Secondary | ICD-10-CM | POA: Diagnosis not present

## 2016-11-22 DIAGNOSIS — H524 Presbyopia: Secondary | ICD-10-CM | POA: Diagnosis not present

## 2016-12-01 ENCOUNTER — Ambulatory Visit: Payer: Self-pay | Admitting: Physician Assistant

## 2016-12-01 ENCOUNTER — Encounter: Payer: Self-pay | Admitting: Physician Assistant

## 2016-12-01 VITALS — BP 150/100 | HR 84 | Temp 98.6°F

## 2016-12-01 DIAGNOSIS — J069 Acute upper respiratory infection, unspecified: Secondary | ICD-10-CM

## 2016-12-01 MED ORDER — AZITHROMYCIN 250 MG PO TABS: ORAL_TABLET | ORAL | 0 refills | Status: DC

## 2016-12-01 NOTE — Progress Notes (Signed)
S: C/o runny nose and congestion with dry cough for 3+ days, + fever, chills, denies cp/sob, v/d; mucus was green this am but clear throughout the day, cough is sporadic, deep and congested, husband has been septic and is at [facility] right now, may come home with a pic line later today or tomorrow  Using otc meds: robitussin  O: PE: vitals wnl, nad,  perrl eomi, normocephalic, tms dull, nasal mucosa red and swollen, throat injected, neck supple no lymph, lungs c t a, cv rrr, neuro intact, flu swab   A:  Acute uri   P: drink fluids, continue regular meds , use otc meds of choice, return if not improving in 5 days, return earlier if worsening, zpack

## 2016-12-02 LAB — POCT INFLUENZA A/B
INFLUENZA A, POC: NEGATIVE
Influenza B, POC: NEGATIVE

## 2017-02-18 DIAGNOSIS — C50912 Malignant neoplasm of unspecified site of left female breast: Secondary | ICD-10-CM | POA: Diagnosis not present

## 2017-05-12 DIAGNOSIS — M1712 Unilateral primary osteoarthritis, left knee: Secondary | ICD-10-CM | POA: Diagnosis not present

## 2017-05-19 DIAGNOSIS — M1712 Unilateral primary osteoarthritis, left knee: Secondary | ICD-10-CM | POA: Diagnosis not present

## 2017-06-11 DIAGNOSIS — M1712 Unilateral primary osteoarthritis, left knee: Secondary | ICD-10-CM | POA: Diagnosis not present

## 2017-08-18 ENCOUNTER — Other Ambulatory Visit: Payer: Self-pay

## 2017-08-18 DIAGNOSIS — Z1231 Encounter for screening mammogram for malignant neoplasm of breast: Secondary | ICD-10-CM

## 2017-08-19 ENCOUNTER — Other Ambulatory Visit: Payer: Self-pay | Admitting: *Deleted

## 2017-08-19 DIAGNOSIS — L987 Excessive and redundant skin and subcutaneous tissue: Secondary | ICD-10-CM

## 2017-08-25 DIAGNOSIS — M546 Pain in thoracic spine: Secondary | ICD-10-CM | POA: Diagnosis not present

## 2017-08-25 DIAGNOSIS — G8929 Other chronic pain: Secondary | ICD-10-CM | POA: Diagnosis not present

## 2017-08-25 DIAGNOSIS — M25562 Pain in left knee: Secondary | ICD-10-CM | POA: Diagnosis not present

## 2017-08-25 DIAGNOSIS — N62 Hypertrophy of breast: Secondary | ICD-10-CM | POA: Diagnosis not present

## 2017-08-25 DIAGNOSIS — M542 Cervicalgia: Secondary | ICD-10-CM | POA: Diagnosis not present

## 2017-08-26 ENCOUNTER — Encounter
Admission: RE | Admit: 2017-08-26 | Discharge: 2017-08-26 | Disposition: A | Discharge status: Home / Self Care | Payer: 59 | Source: Ambulatory Visit | Attending: Orthopedic Surgery | Admitting: Orthopedic Surgery

## 2017-08-26 DIAGNOSIS — I4891 Unspecified atrial fibrillation: Secondary | ICD-10-CM | POA: Diagnosis not present

## 2017-08-26 DIAGNOSIS — Z01810 Encounter for preprocedural cardiovascular examination: Secondary | ICD-10-CM | POA: Insufficient documentation

## 2017-08-26 DIAGNOSIS — I10 Essential (primary) hypertension: Secondary | ICD-10-CM | POA: Insufficient documentation

## 2017-08-26 DIAGNOSIS — E119 Type 2 diabetes mellitus without complications: Secondary | ICD-10-CM | POA: Diagnosis not present

## 2017-08-26 HISTORY — DX: Other complications of anesthesia, initial encounter: T88.59XA

## 2017-08-26 HISTORY — DX: Adverse effect of unspecified anesthetic, initial encounter: T41.45XA

## 2017-08-26 LAB — CBC
HEMATOCRIT: 37.5 % (ref 35.0–47.0)
Hemoglobin: 12.5 g/dL (ref 12.0–16.0)
MCH: 29.6 pg (ref 26.0–34.0)
MCHC: 33.3 g/dL (ref 32.0–36.0)
MCV: 88.9 fL (ref 80.0–100.0)
PLATELETS: 120 10*3/uL — AB (ref 150–440)
RBC: 4.22 MIL/uL (ref 3.80–5.20)
RDW: 13.8 % (ref 11.5–14.5)
WBC: 4 10*3/uL (ref 3.6–11.0)

## 2017-08-26 LAB — COMPREHENSIVE METABOLIC PANEL
ALBUMIN: 3.6 g/dL (ref 3.5–5.0)
ALT: 34 U/L (ref 14–54)
ANION GAP: 7 (ref 5–15)
AST: 27 U/L (ref 15–41)
Alkaline Phosphatase: 95 U/L (ref 38–126)
BILIRUBIN TOTAL: 0.6 mg/dL (ref 0.3–1.2)
BUN: 12 mg/dL (ref 6–20)
CHLORIDE: 109 mmol/L (ref 101–111)
CO2: 28 mmol/L (ref 22–32)
Calcium: 9.1 mg/dL (ref 8.9–10.3)
Creatinine, Ser: 0.71 mg/dL (ref 0.44–1.00)
GFR calc Af Amer: >60 mL/min (ref >60)
Glucose, Bld: 89 mg/dL (ref 65–99)
POTASSIUM: 3.6 mmol/L (ref 3.5–5.1)
Sodium: 144 mmol/L (ref 135–145)
TOTAL PROTEIN: 6.9 g/dL (ref 6.5–8.1)

## 2017-08-26 LAB — URINALYSIS, ROUTINE W REFLEX MICROSCOPIC
BILIRUBIN URINE: NEGATIVE
Glucose, UA: NEGATIVE mg/dL
HGB URINE DIPSTICK: NEGATIVE
KETONES UR: NEGATIVE mg/dL
Leukocytes, UA: NEGATIVE
Nitrite: NEGATIVE
PROTEIN: NEGATIVE mg/dL
Specific Gravity, Urine: 1.021 (ref 1.005–1.030)
pH: 5 (ref 5.0–8.0)

## 2017-08-26 LAB — SEDIMENTATION RATE: SED RATE: 15 mm/h (ref 0–30)

## 2017-08-26 LAB — APTT: APTT: 35 s (ref 24–36)

## 2017-08-26 LAB — TYPE AND SCREEN
ABO/RH(D): O POS
Antibody Screen: NEGATIVE

## 2017-08-26 LAB — HEMOGLOBIN A1C
Hgb A1c MFr Bld: 4.9 % (ref 4.8–5.6)
Mean Plasma Glucose: 93.93 mg/dL

## 2017-08-26 LAB — PROTIME-INR
INR: 1.1
PROTHROMBIN TIME: 14.1 s (ref 11.4–15.2)

## 2017-08-26 LAB — C-REACTIVE PROTEIN: CRP: <0.8 mg/dL (ref <1.0)

## 2017-08-26 LAB — SURGICAL PCR SCREEN
MRSA, PCR: NEGATIVE
STAPHYLOCOCCUS AUREUS: POSITIVE — AB

## 2017-08-26 NOTE — Care Management (Signed)
Pt reports she is a difficult intubation in past, Dr. Andree Elk had to use a lighted scope and position head with towels.  See notes.

## 2017-08-26 NOTE — Pre-Procedure Instructions (Signed)
PCR and hemogram results faxed to Dr. Marry Guan for evaluation and treatment as indicated.

## 2017-08-26 NOTE — Patient Instructions (Signed)
Your procedure is scheduled VF:IEPPIR Oct. 15, 2018. Report to Same Day Surgery. To find out your arrival time please call [phone] between 1PM - 3PM on Friday Oct. 12, 2018 .  Remember: Instructions that are not followed completely may result in serious medical risk, up to and including death, or upon the discretion of your surgeon and anesthesiologist your surgery may need to be rescheduled.     _X__ 1. Do not eat food after midnight the night before your procedure.                 No gum chewing or hard candies. You may drink clear liquids up to 2 hours                 before you are scheduled to arrive for your surgery- DO not drink clear                 liquids within 2 hours of the start of your surgery.                 Clear Liquids include:  water, apple juice without pulp, clear carbohydrate                 drink such as Clearfast of Gartorade, Black Coffee or Tea (Do not add                 anything to coffee or tea).     ___ 2.  No Alcohol for 24 hours before or after surgery.   ___ 3.  Do Not Smoke or use e-cigarettes For 24 Hours Prior to Your Surgery.                 Do not use any chewable tobacco products for at least 6 hours prior to                 surgery.  ____  4.  Bring all medications with you on the day of surgery if instructed.   __x__  5.  Notify your doctor if there is any change in your medical condition      (cold, fever, infections).     Do not wear jewelry, make-up, hairpins, clips or nail polish. Do not wear lotions, powders, or perfumes. You may wear deodorant. Do not shave 48 hours prior to surgery. Men may shave face and neck. Do not bring valuables to the hospital.    [facility] is not responsible for any belongings or valuables.  Contacts, dentures or bridgework may not be worn into surgery. Leave your suitcase in the car. After surgery it may be brought to your room. For patients admitted to the hospital, discharge  time is determined by your treatment team.   Patients discharged the day of surgery will not be allowed to drive home.   Please read over the following fact sheets that you were given: Preparing for Surgery    ____ Take these medicines the morning of surgery with A SIP OF WATER: NONE     ____ Fleet Enema (as directed)   _x___ Use CHG Soap as directed  ____ Use inhalers on the day of surgery  ____ Stop metformin 2 days prior to surgery    ____ Take 1/2 of usual insulin dose the night before surgery. No insulin the morning          of surgery.   ____ Stop Coumadin/Plavix/aspirin on does not apply  ____ Stop Anti-inflammatories on 08/31/17. OK  to take Tylenol.   ____ Stop supplements until after surgery.    ____ Bring C-Pap to the hospital.

## 2017-08-26 NOTE — Pre-Procedure Instructions (Signed)
AS INSTRUCTED BY DR P CARROLL, EKG AND REQUEST TO HAVE PCP [facility] AND ADDRESS EKG FAXED TO DR HOOTEN. SPOKE WITH MISTY

## 2017-08-27 DIAGNOSIS — R0602 Shortness of breath: Secondary | ICD-10-CM | POA: Diagnosis not present

## 2017-08-27 DIAGNOSIS — E782 Mixed hyperlipidemia: Secondary | ICD-10-CM | POA: Diagnosis not present

## 2017-08-27 DIAGNOSIS — I4891 Unspecified atrial fibrillation: Secondary | ICD-10-CM | POA: Diagnosis not present

## 2017-08-27 DIAGNOSIS — I10 Essential (primary) hypertension: Secondary | ICD-10-CM | POA: Diagnosis not present

## 2017-08-27 LAB — URINE CULTURE: Special Requests: NORMAL

## 2017-08-28 NOTE — Pre-Procedure Instructions (Signed)
Urine culture results sent to Dr. Marry Guan for review.  Asked if wanted to have it recollected as suggested?

## 2017-09-01 DIAGNOSIS — M542 Cervicalgia: Secondary | ICD-10-CM | POA: Insufficient documentation

## 2017-09-01 DIAGNOSIS — G8929 Other chronic pain: Secondary | ICD-10-CM | POA: Insufficient documentation

## 2017-09-01 DIAGNOSIS — N62 Hypertrophy of breast: Secondary | ICD-10-CM | POA: Insufficient documentation

## 2017-09-01 NOTE — Pre-Procedure Instructions (Signed)
[place] 08/31/17

## 2017-09-04 DIAGNOSIS — R0602 Shortness of breath: Secondary | ICD-10-CM | POA: Diagnosis not present

## 2017-09-04 DIAGNOSIS — I4891 Unspecified atrial fibrillation: Secondary | ICD-10-CM | POA: Diagnosis not present

## 2017-09-04 DIAGNOSIS — I10 Essential (primary) hypertension: Secondary | ICD-10-CM | POA: Diagnosis not present

## 2017-09-04 DIAGNOSIS — E782 Mixed hyperlipidemia: Secondary | ICD-10-CM | POA: Diagnosis not present

## 2017-09-04 DIAGNOSIS — I340 Nonrheumatic mitral (valve) insufficiency: Secondary | ICD-10-CM | POA: Diagnosis not present

## 2017-09-07 ENCOUNTER — Encounter
Admission: AD | Disposition: A | Discharge status: Home Health | Payer: Self-pay | Source: Ambulatory Visit | Attending: Orthopedic Surgery

## 2017-09-07 ENCOUNTER — Inpatient Hospital Stay
Admission: AD | Admit: 2017-09-07 | Discharge: 2017-09-09 | DRG: 470 | Disposition: A | Discharge status: Home Health | Payer: 59 | Source: Ambulatory Visit | Attending: Orthopedic Surgery | Admitting: Orthopedic Surgery

## 2017-09-07 ENCOUNTER — Inpatient Hospital Stay: Payer: 59

## 2017-09-07 ENCOUNTER — Ambulatory Visit: Payer: 59 | Admitting: Anesthesiology

## 2017-09-07 ENCOUNTER — Encounter: Payer: Self-pay | Admitting: Orthopedic Surgery

## 2017-09-07 DIAGNOSIS — Z923 Personal history of irradiation: Secondary | ICD-10-CM | POA: Diagnosis not present

## 2017-09-07 DIAGNOSIS — Z880 Allergy status to penicillin: Secondary | ICD-10-CM | POA: Diagnosis not present

## 2017-09-07 DIAGNOSIS — Z833 Family history of diabetes mellitus: Secondary | ICD-10-CM | POA: Diagnosis not present

## 2017-09-07 DIAGNOSIS — Z96659 Presence of unspecified artificial knee joint: Secondary | ICD-10-CM

## 2017-09-07 DIAGNOSIS — Z96652 Presence of left artificial knee joint: Secondary | ICD-10-CM

## 2017-09-07 DIAGNOSIS — I10 Essential (primary) hypertension: Secondary | ICD-10-CM | POA: Diagnosis not present

## 2017-09-07 DIAGNOSIS — Z9012 Acquired absence of left breast and nipple: Secondary | ICD-10-CM

## 2017-09-07 DIAGNOSIS — Z8249 Family history of ischemic heart disease and other diseases of the circulatory system: Secondary | ICD-10-CM

## 2017-09-07 DIAGNOSIS — G4733 Obstructive sleep apnea (adult) (pediatric): Secondary | ICD-10-CM | POA: Diagnosis present

## 2017-09-07 DIAGNOSIS — M1712 Unilateral primary osteoarthritis, left knee: Principal | ICD-10-CM | POA: Diagnosis present

## 2017-09-07 DIAGNOSIS — E119 Type 2 diabetes mellitus without complications: Secondary | ICD-10-CM | POA: Diagnosis not present

## 2017-09-07 DIAGNOSIS — Z9221 Personal history of antineoplastic chemotherapy: Secondary | ICD-10-CM | POA: Diagnosis not present

## 2017-09-07 DIAGNOSIS — G4730 Sleep apnea, unspecified: Secondary | ICD-10-CM | POA: Diagnosis not present

## 2017-09-07 DIAGNOSIS — Z471 Aftercare following joint replacement surgery: Secondary | ICD-10-CM | POA: Diagnosis not present

## 2017-09-07 DIAGNOSIS — Z90710 Acquired absence of both cervix and uterus: Secondary | ICD-10-CM

## 2017-09-07 DIAGNOSIS — Z853 Personal history of malignant neoplasm of breast: Secondary | ICD-10-CM | POA: Diagnosis not present

## 2017-09-07 DIAGNOSIS — S78119A Complete traumatic amputation at level between unspecified hip and knee, initial encounter: Secondary | ICD-10-CM | POA: Diagnosis not present

## 2017-09-07 DIAGNOSIS — Z887 Allergy status to serum and vaccine status: Secondary | ICD-10-CM | POA: Diagnosis not present

## 2017-09-07 HISTORY — PX: KNEE ARTHROPLASTY: SHX992

## 2017-09-07 LAB — GLUCOSE, CAPILLARY: GLUCOSE-CAPILLARY: 94 mg/dL (ref 65–99)

## 2017-09-07 SURGERY — ARTHROPLASTY, KNEE, TOTAL, USING IMAGELESS COMPUTER-ASSISTED NAVIGATION
Anesthesia: Spinal | Site: Knee | Laterality: Left | Wound class: Clean

## 2017-09-07 MED ORDER — BUPIVACAINE LIPOSOME 1.3 % IJ SUSP
INTRAMUSCULAR | Status: AC
Start: 2017-09-07 — End: ?
  Filled 2017-09-07: qty 20

## 2017-09-07 MED ORDER — CLINDAMYCIN PHOSPHATE 900 MG/50ML IV SOLN
900.0000 mg | INTRAVENOUS | Status: AC
  Administered 2017-09-07: 900 mg via INTRAVENOUS

## 2017-09-07 MED ORDER — DIPHENHYDRAMINE HCL 12.5 MG/5ML PO ELIX: 12.5000 mg | ORAL_SOLUTION | ORAL | Status: DC | PRN

## 2017-09-07 MED ORDER — PROPOFOL 500 MG/50ML IV EMUL
INTRAVENOUS | Status: AC
  Filled 2017-09-07: qty 50

## 2017-09-07 MED ORDER — SENNOSIDES-DOCUSATE SODIUM 8.6-50 MG PO TABS
1.0000 | ORAL_TABLET | Freq: Two times a day (BID) | ORAL | Status: DC
  Administered 2017-09-07 – 2017-09-09 (×4): 1 via ORAL
  Filled 2017-09-07 (×4): qty 1

## 2017-09-07 MED ORDER — SODIUM CHLORIDE 0.9 % IV SOLN
INTRAVENOUS | Status: DC | PRN
  Administered 2017-09-07: 6 ug/kg/min via INTRAVENOUS

## 2017-09-07 MED ORDER — MAGNESIUM HYDROXIDE 400 MG/5ML PO SUSP
30.0000 mL | Freq: Every day | ORAL | Status: DC | PRN
  Administered 2017-09-09: 30 mL via ORAL
  Filled 2017-09-07: qty 30

## 2017-09-07 MED ORDER — ADULT MULTIVITAMIN W/MINERALS CH
1.0000 | ORAL_TABLET | Freq: Every day | ORAL | Status: DC
  Administered 2017-09-08 – 2017-09-09 (×2): 1 via ORAL
  Filled 2017-09-07 (×2): qty 1

## 2017-09-07 MED ORDER — FERROUS SULFATE 325 (65 FE) MG PO TABS
325.0000 mg | ORAL_TABLET | Freq: Two times a day (BID) | ORAL | Status: DC
  Administered 2017-09-08 – 2017-09-09 (×3): 325 mg via ORAL
  Filled 2017-09-07 (×3): qty 1

## 2017-09-07 MED ORDER — ACETAMINOPHEN 325 MG PO TABS
650.0000 mg | ORAL_TABLET | Freq: Four times a day (QID) | ORAL | Status: DC | PRN
  Filled 2017-09-07: qty 2

## 2017-09-07 MED ORDER — TETRACAINE HCL 1 % IJ SOLN
INTRAMUSCULAR | Status: DC | PRN
  Administered 2017-09-07: 5 mg via INTRASPINAL

## 2017-09-07 MED ORDER — APIXABAN 2.5 MG PO TABS
2.5000 mg | ORAL_TABLET | Freq: Two times a day (BID) | ORAL | Status: AC
  Administered 2017-09-08 (×2): 2.5 mg via ORAL
  Filled 2017-09-07 (×2): qty 1

## 2017-09-07 MED ORDER — MIDAZOLAM HCL 2 MG/2ML IJ SOLN
INTRAMUSCULAR | Status: AC
  Administered 2017-09-07: 1 mg via INTRAVENOUS
  Filled 2017-09-07: qty 2

## 2017-09-07 MED ORDER — FENTANYL CITRATE (PF) 100 MCG/2ML IJ SOLN: 25.0000 ug | INTRAMUSCULAR | Status: DC | PRN

## 2017-09-07 MED ORDER — SODIUM CHLORIDE 0.9 % IV SOLN
INTRAVENOUS | Status: DC | PRN
  Administered 2017-09-07: 60 mL

## 2017-09-07 MED ORDER — ACETAMINOPHEN 10 MG/ML IV SOLN
INTRAVENOUS | Status: DC | PRN
  Administered 2017-09-07: 1000 mg via INTRAVENOUS

## 2017-09-07 MED ORDER — PHENOL 1.4 % MT LIQD
1.0000 | OROMUCOSAL | Status: DC | PRN
  Filled 2017-09-07: qty 177

## 2017-09-07 MED ORDER — OXYCODONE HCL 5 MG PO TABS
5.0000 mg | ORAL_TABLET | ORAL | Status: DC | PRN
  Administered 2017-09-07 (×2): 5 mg via ORAL
  Administered 2017-09-08 (×2): 10 mg via ORAL
  Administered 2017-09-08 – 2017-09-09 (×2): 5 mg via ORAL
  Administered 2017-09-09: 10 mg via ORAL
  Filled 2017-09-07 (×2): qty 1
  Filled 2017-09-07: qty 2
  Filled 2017-09-07 (×3): qty 1
  Filled 2017-09-07: qty 2
  Filled 2017-09-07: qty 1

## 2017-09-07 MED ORDER — FAMOTIDINE 20 MG PO TABS
ORAL_TABLET | ORAL | Status: AC
  Administered 2017-09-07: 20 mg via ORAL
  Filled 2017-09-07: qty 1

## 2017-09-07 MED ORDER — FENTANYL CITRATE (PF) 100 MCG/2ML IJ SOLN
INTRAMUSCULAR | Status: DC | PRN
  Administered 2017-09-07: 25 ug via INTRAVENOUS

## 2017-09-07 MED ORDER — ACETAMINOPHEN 10 MG/ML IV SOLN
1000.0000 mg | Freq: Four times a day (QID) | INTRAVENOUS | Status: AC
  Administered 2017-09-07 – 2017-09-08 (×4): 1000 mg via INTRAVENOUS
  Filled 2017-09-07 (×4): qty 100

## 2017-09-07 MED ORDER — ONDANSETRON HCL 4 MG/2ML IJ SOLN
4.0000 mg | Freq: Four times a day (QID) | INTRAMUSCULAR | Status: DC | PRN
  Administered 2017-09-07 – 2017-09-08 (×2): 4 mg via INTRAVENOUS
  Filled 2017-09-07 (×3): qty 2

## 2017-09-07 MED ORDER — FUROSEMIDE 20 MG PO TABS
20.0000 mg | ORAL_TABLET | Freq: Every day | ORAL | Status: DC
  Administered 2017-09-08 – 2017-09-09 (×2): 20 mg via ORAL
  Filled 2017-09-07 (×3): qty 1

## 2017-09-07 MED ORDER — PROPOFOL 10 MG/ML IV BOLUS
INTRAVENOUS | Status: DC | PRN
  Administered 2017-09-07 (×2): 30 mg via INTRAVENOUS

## 2017-09-07 MED ORDER — MORPHINE SULFATE (PF) 2 MG/ML IV SOLN
2.0000 mg | INTRAVENOUS | Status: DC | PRN
Start: 2017-09-07 — End: 2017-09-09
  Administered 2017-09-07 – 2017-09-08 (×3): 2 mg via INTRAVENOUS
  Filled 2017-09-07 (×3): qty 1

## 2017-09-07 MED ORDER — BUPIVACAINE HCL (PF) 0.25 % IJ SOLN
INTRAMUSCULAR | Status: AC
  Filled 2017-09-07: qty 30

## 2017-09-07 MED ORDER — FENTANYL CITRATE (PF) 100 MCG/2ML IJ SOLN
INTRAMUSCULAR | Status: AC
  Filled 2017-09-07: qty 2

## 2017-09-07 MED ORDER — BISACODYL 10 MG RE SUPP: 10.0000 mg | Freq: Every day | RECTAL | Status: DC | PRN

## 2017-09-07 MED ORDER — BUPIVACAINE HCL (PF) 0.25 % IJ SOLN
INTRAMUSCULAR | Status: DC | PRN
  Administered 2017-09-07: 60 mL

## 2017-09-07 MED ORDER — FAMOTIDINE 20 MG PO TABS
20.0000 mg | ORAL_TABLET | Freq: Once | ORAL | Status: AC
  Administered 2017-09-07: 20 mg via ORAL

## 2017-09-07 MED ORDER — CLINDAMYCIN PHOSPHATE 600 MG/50ML IV SOLN
600.0000 mg | Freq: Four times a day (QID) | INTRAVENOUS | Status: AC
  Administered 2017-09-07 – 2017-09-08 (×4): 600 mg via INTRAVENOUS
  Filled 2017-09-07 (×4): qty 50

## 2017-09-07 MED ORDER — MIDAZOLAM HCL 2 MG/2ML IJ SOLN
1.0000 mg | Freq: Once | INTRAMUSCULAR | Status: AC
  Administered 2017-09-07: 1 mg via INTRAVENOUS

## 2017-09-07 MED ORDER — ACETAMINOPHEN 10 MG/ML IV SOLN
INTRAVENOUS | Status: AC
  Filled 2017-09-07: qty 100

## 2017-09-07 MED ORDER — ALUM & MAG HYDROXIDE-SIMETH 200-200-20 MG/5ML PO SUSP: 30.0000 mL | ORAL | Status: DC | PRN

## 2017-09-07 MED ORDER — SODIUM CHLORIDE 0.9 % IV SOLN
INTRAVENOUS | Status: DC
  Administered 2017-09-07 (×2): via INTRAVENOUS

## 2017-09-07 MED ORDER — SODIUM CHLORIDE 0.9 % IJ SOLN
INTRAMUSCULAR | Status: AC
  Filled 2017-09-07: qty 50

## 2017-09-07 MED ORDER — PROPOFOL 500 MG/50ML IV EMUL
INTRAVENOUS | Status: DC | PRN
  Administered 2017-09-07: 60 ug/kg/min via INTRAVENOUS

## 2017-09-07 MED ORDER — TRAMADOL HCL 50 MG PO TABS: 50.0000 mg | ORAL_TABLET | ORAL | Status: DC | PRN

## 2017-09-07 MED ORDER — OXYCODONE HCL 5 MG PO TABS: 5.0000 mg | ORAL_TABLET | Freq: Once | ORAL | Status: DC | PRN

## 2017-09-07 MED ORDER — ONDANSETRON HCL 4 MG/2ML IJ SOLN
4.0000 mg | Freq: Once | INTRAMUSCULAR | Status: AC
  Administered 2017-09-07: 4 mg via INTRAVENOUS

## 2017-09-07 MED ORDER — MIDAZOLAM HCL 5 MG/5ML IJ SOLN
INTRAMUSCULAR | Status: DC | PRN
  Administered 2017-09-07: 2 mg via INTRAVENOUS

## 2017-09-07 MED ORDER — TRANEXAMIC ACID 1000 MG/10ML IV SOLN
1000.0000 mg | Freq: Once | INTRAVENOUS | Status: AC
  Administered 2017-09-07: 1000 mg via INTRAVENOUS
  Filled 2017-09-07: qty 10

## 2017-09-07 MED ORDER — APIXABAN 2.5 MG PO TABS: 2.5000 mg | ORAL_TABLET | Freq: Two times a day (BID) | ORAL | Status: DC

## 2017-09-07 MED ORDER — SODIUM CHLORIDE 0.9 % IV SOLN
INTRAVENOUS | Status: DC | PRN
  Administered 2017-09-07: 10 ug/min via INTRAVENOUS

## 2017-09-07 MED ORDER — FLEET ENEMA 7-19 GM/118ML RE ENEM: 1.0000 | ENEMA | Freq: Once | RECTAL | Status: DC | PRN

## 2017-09-07 MED ORDER — TRANEXAMIC ACID 1000 MG/10ML IV SOLN
1000.0000 mg | INTRAVENOUS | Status: AC
  Administered 2017-09-07: 1000 mg via INTRAVENOUS
  Filled 2017-09-07: qty 10

## 2017-09-07 MED ORDER — CELECOXIB 200 MG PO CAPS
200.0000 mg | ORAL_CAPSULE | Freq: Two times a day (BID) | ORAL | Status: DC
  Administered 2017-09-07 – 2017-09-09 (×4): 200 mg via ORAL
  Filled 2017-09-07 (×4): qty 1

## 2017-09-07 MED ORDER — ONDANSETRON HCL 4 MG/2ML IJ SOLN
INTRAMUSCULAR | Status: AC
  Filled 2017-09-07: qty 2

## 2017-09-07 MED ORDER — OXYCODONE HCL 5 MG/5ML PO SOLN: 5.0000 mg | Freq: Once | ORAL | Status: DC | PRN

## 2017-09-07 MED ORDER — APIXABAN 5 MG PO TABS
5.0000 mg | ORAL_TABLET | Freq: Two times a day (BID) | ORAL | Status: DC
  Administered 2017-09-09: 5 mg via ORAL
  Filled 2017-09-07: qty 1

## 2017-09-07 MED ORDER — SODIUM CHLORIDE 0.9 % IV SOLN
INTRAVENOUS | Status: DC
  Administered 2017-09-07 (×2): via INTRAVENOUS

## 2017-09-07 MED ORDER — CLINDAMYCIN PHOSPHATE 900 MG/50ML IV SOLN
INTRAVENOUS | Status: AC
  Filled 2017-09-07: qty 50

## 2017-09-07 MED ORDER — CHLORHEXIDINE GLUCONATE 4 % EX LIQD
60.0000 mL | Freq: Once | CUTANEOUS | Status: AC
  Administered 2017-09-07: 4 via TOPICAL

## 2017-09-07 MED ORDER — MENTHOL 3 MG MT LOZG
1.0000 | LOZENGE | OROMUCOSAL | Status: DC | PRN
  Filled 2017-09-07: qty 9

## 2017-09-07 MED ORDER — BUPIVACAINE HCL (PF) 0.5 % IJ SOLN
INTRAMUSCULAR | Status: DC | PRN
  Administered 2017-09-07: 2.5 mL

## 2017-09-07 MED ORDER — ONDANSETRON HCL 4 MG PO TABS: 4.0000 mg | ORAL_TABLET | Freq: Four times a day (QID) | ORAL | Status: DC | PRN

## 2017-09-07 MED ORDER — NEOMYCIN-POLYMYXIN B GU 40-200000 IR SOLN
Status: AC
  Filled 2017-09-07: qty 20

## 2017-09-07 MED ORDER — NEOMYCIN-POLYMYXIN B GU 40-200000 IR SOLN
Status: DC | PRN
  Administered 2017-09-07: 14 mL

## 2017-09-07 MED ORDER — PANTOPRAZOLE SODIUM 40 MG PO TBEC
40.0000 mg | DELAYED_RELEASE_TABLET | Freq: Two times a day (BID) | ORAL | Status: DC
  Administered 2017-09-07 – 2017-09-09 (×4): 40 mg via ORAL
  Filled 2017-09-07 (×4): qty 1

## 2017-09-07 MED ORDER — DEXAMETHASONE SODIUM PHOSPHATE 4 MG/ML IJ SOLN
INTRAMUSCULAR | Status: DC | PRN
  Administered 2017-09-07: 5 mg via INTRAVENOUS

## 2017-09-07 MED ORDER — HYDROCHLOROTHIAZIDE 12.5 MG PO CAPS
12.5000 mg | ORAL_CAPSULE | Freq: Every day | ORAL | Status: DC
  Administered 2017-09-08 – 2017-09-09 (×2): 12.5 mg via ORAL
  Filled 2017-09-07 (×2): qty 1

## 2017-09-07 MED ORDER — SOTALOL HCL 80 MG PO TABS
80.0000 mg | ORAL_TABLET | Freq: Two times a day (BID) | ORAL | Status: DC
  Administered 2017-09-07 – 2017-09-08 (×2): 80 mg via ORAL
  Filled 2017-09-07 (×5): qty 1

## 2017-09-07 MED ORDER — ACETAMINOPHEN 650 MG RE SUPP: 650.0000 mg | Freq: Four times a day (QID) | RECTAL | Status: DC | PRN

## 2017-09-07 MED ORDER — MIDAZOLAM HCL 2 MG/2ML IJ SOLN
INTRAMUSCULAR | Status: AC
  Filled 2017-09-07: qty 2

## 2017-09-07 MED ORDER — LOSARTAN POTASSIUM 50 MG PO TABS
50.0000 mg | ORAL_TABLET | Freq: Every day | ORAL | Status: DC
  Administered 2017-09-08 – 2017-09-09 (×2): 50 mg via ORAL
  Filled 2017-09-07 (×2): qty 1

## 2017-09-07 SURGICAL SUPPLY — 70 items
BATTERY INSTRU NAVIGATION (MISCELLANEOUS) ×12 IMPLANT
BLADE CLIPPER SURG (BLADE) ×3 IMPLANT
BLADE SAW 1 (BLADE) ×3 IMPLANT
BLADE SAW 1/2 (BLADE) ×3 IMPLANT
BLADE SAW 70X12.5 (BLADE) IMPLANT
BONE CEMENT GENTAMICIN (Cement) ×6 IMPLANT
CANISTER SUCT 1200ML W/VALVE (MISCELLANEOUS) ×3 IMPLANT
CANISTER SUCT 3000ML PPV (MISCELLANEOUS) ×6 IMPLANT
CAPT KNEE TOTAL 3 ATTUNE ×3 IMPLANT
CATH TRAY METER 16FR LF (MISCELLANEOUS) ×3 IMPLANT
CEMENT BONE GENTAMICIN 40 (Cement) ×2 IMPLANT
COOLER POLAR GLACIER W/PUMP (MISCELLANEOUS) ×3 IMPLANT
CUFF TOURN 24 STER (MISCELLANEOUS) IMPLANT
CUFF TOURN 30 STER DUAL PORT (MISCELLANEOUS) IMPLANT
CUFF TOURN 34 STER (MISCELLANEOUS) ×3 IMPLANT
DRAPE SHEET LG 3/4 BI-LAMINATE (DRAPES) ×3 IMPLANT
DRSG DERMACEA 8X12 NADH (GAUZE/BANDAGES/DRESSINGS) ×3 IMPLANT
DRSG OPSITE POSTOP 4X14 (GAUZE/BANDAGES/DRESSINGS) ×3 IMPLANT
DRSG TEGADERM 4X4.75 (GAUZE/BANDAGES/DRESSINGS) ×3 IMPLANT
DURAPREP 26ML APPLICATOR (WOUND CARE) ×6 IMPLANT
ELECT CAUTERY BLADE 6.4 (BLADE) ×3 IMPLANT
ELECT REM PT RETURN 9FT ADLT (ELECTROSURGICAL) ×3
ELECTRODE REM PT RTRN 9FT ADLT (ELECTROSURGICAL) ×1 IMPLANT
EVACUATOR 1/8 PVC DRAIN (DRAIN) ×3 IMPLANT
EX-PIN ORTHOLOCK NAV 4X150 (PIN) ×6 IMPLANT
GLOVE BIOGEL M STRL SZ7.5 (GLOVE) ×6 IMPLANT
GLOVE BIOGEL PI IND STRL 7.0 (GLOVE) ×6 IMPLANT
GLOVE BIOGEL PI IND STRL 9 (GLOVE) ×1 IMPLANT
GLOVE BIOGEL PI INDICATOR 7.0 (GLOVE) ×12
GLOVE BIOGEL PI INDICATOR 9 (GLOVE) ×2
GLOVE INDICATOR 8.0 STRL GRN (GLOVE) ×3 IMPLANT
GLOVE SURG SYN 9.0  PF PI (GLOVE) ×2
GLOVE SURG SYN 9.0 PF PI (GLOVE) ×1 IMPLANT
GOWN STRL REUS W/ TWL LRG LVL3 (GOWN DISPOSABLE) ×3 IMPLANT
GOWN STRL REUS W/TWL 2XL LVL3 (GOWN DISPOSABLE) ×3 IMPLANT
GOWN STRL REUS W/TWL LRG LVL3 (GOWN DISPOSABLE) ×6
HOLDER FOLEY CATH W/STRAP (MISCELLANEOUS) ×3 IMPLANT
HOOD PEEL AWAY FLYTE STAYCOOL (MISCELLANEOUS) ×6 IMPLANT
KIT RM TURNOVER STRD PROC AR (KITS) ×3 IMPLANT
KNIFE SCULPS 14X20 (INSTRUMENTS) ×3 IMPLANT
LABEL OR SOLS (LABEL) ×3 IMPLANT
NDL SAFETY 18GX1.5 (NEEDLE) ×3 IMPLANT
NEEDLE SPNL 20GX3.5 QUINCKE YW (NEEDLE) ×6 IMPLANT
NS IRRIG 500ML POUR BTL (IV SOLUTION) ×3 IMPLANT
PACK TOTAL KNEE (MISCELLANEOUS) ×3 IMPLANT
PAD WRAPON POLAR KNEE (MISCELLANEOUS) IMPLANT
PAD WRAPON POLOR MULTI XL (MISCELLANEOUS) ×1 IMPLANT
PIN DRILL QUICK PACK ×3 IMPLANT
PIN FIXATION 1/8DIA X 3INL (PIN) ×3 IMPLANT
PULSAVAC PLUS IRRIG FAN TIP (DISPOSABLE) ×3
SOL .9 NS 3000ML IRR  AL (IV SOLUTION) ×2
SOL .9 NS 3000ML IRR UROMATIC (IV SOLUTION) ×1 IMPLANT
SOL PREP PVP 2OZ (MISCELLANEOUS) ×3
SOLUTION PREP PVP 2OZ (MISCELLANEOUS) ×1 IMPLANT
SPONGE DRAIN TRACH 4X4 STRL 2S (GAUZE/BANDAGES/DRESSINGS) ×3 IMPLANT
STAPLER SKIN PROX 35W (STAPLE) ×3 IMPLANT
STRAP TIBIA SHORT (MISCELLANEOUS) ×3 IMPLANT
SUCTION FRAZIER HANDLE 10FR (MISCELLANEOUS) ×2
SUCTION TUBE FRAZIER 10FR DISP (MISCELLANEOUS) ×1 IMPLANT
SUT VIC AB 0 CT1 36 (SUTURE) ×3 IMPLANT
SUT VIC AB 1 CT1 36 (SUTURE) ×6 IMPLANT
SUT VIC AB 2-0 CT2 27 (SUTURE) ×3 IMPLANT
SYR 20CC LL (SYRINGE) ×3 IMPLANT
SYR 30ML LL (SYRINGE) ×6 IMPLANT
TIP FAN IRRIG PULSAVAC PLUS (DISPOSABLE) ×1 IMPLANT
TOWEL OR 17X26 4PK STRL BLUE (TOWEL DISPOSABLE) ×3 IMPLANT
TOWER CARTRIDGE SMART MIX (DISPOSABLE) ×3 IMPLANT
WRAP-ON POLOR PAD MULTI XL (MISCELLANEOUS) ×1
WRAPON POLAR PAD KNEE (MISCELLANEOUS)
WRAPON POLOR PAD MULTI XL (MISCELLANEOUS) ×2

## 2017-09-07 NOTE — H&P (Signed)
The patient has been re-examined, and the chart reviewed, and there have been no interval changes to the documented history and physical.    The risks, benefits, and alternatives have been discussed at length. The patient expressed understanding of the risks benefits and agreed with plans for surgical intervention.  Bellamie Turney P. Ary Rudnick, Jr. M.D.    

## 2017-09-07 NOTE — NC FL2 (Signed)
[place] LEVEL OF CARE SCREENING TOOL     IDENTIFICATION  Patient Name: Mandy Roberts Birthdate: Feb 15, 1961 Sex: female Admission Date (Current Location): 09/07/2017  [place] and [place] Number:  Engineering geologist and Address:  [facility], [address], [place], [place] [address]      Provider Number: [id number]  Attending Physician Name and Address:  Dereck Leep, MD  Relative Name and Phone Number:       Current Level of Care: Hospital Recommended Level of Care: [place] Prior Approval Number:    Date Approved/Denied:   PASRR Number:  [id number])  Discharge Plan: SNF    Current Diagnoses: Patient Active Problem List   Diagnosis Date Noted  . 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 Difficult airway for intubation 04/09/2015  . Chronic nasal congestion 04/09/2015  . Atrial fibrillation ([place]) 09/16/2013  . Obstructive sleep apnea 09/16/2013  . Morbid obesity ([place]) 09/16/2013  . Personal history of breast cancer 02/09/2013  . Hypertension   . Cancer ([place])     Orientation RESPIRATION BLADDER Height & Weight     Self, Time, Situation, Place  O2 (2 Liters Oxygen ) Continent Weight: 242 lb (109.8 kg) Height:  5\' 6"  (167.6 cm)  BEHAVIORAL SYMPTOMS/MOOD NEUROLOGICAL BOWEL NUTRITION STATUS      Continent Diet (Regular Diet )  AMBULATORY STATUS COMMUNICATION OF NEEDS Skin   Extensive Assist Verbally Surgical wounds (Incision: Left Knee )                       Personal Care Assistance Level of Assistance  Bathing, Feeding, Dressing Bathing Assistance: Limited assistance Feeding assistance: Independent Dressing Assistance: Limited assistance     Functional Limitations Info  Sight, Hearing, Speech Sight Info: Adequate Hearing Info: Adequate Speech Info: Adequate    SPECIAL CARE FACTORS FREQUENCY  PT (By licensed PT), OT (By licensed OT)     PT Frequency:  (5) OT Frequency:  (5)             Contractures      Additional Factors Info  Code Status, Allergies Code Status Info:  (Full Code. ) Allergies Info:  (Tdap Diphth-acell Pertussis-tetanus, Penicillins)           Current Medications (09/07/2017):  This is the current hospital active medication list Current Facility-Administered Medications  Medication Dose Route Frequency Provider Last Rate Last Dose  . 0.9 %  sodium chloride infusion   Intravenous Continuous Hooten, Laurice Record, MD      . acetaminophen (OFIRMEV) IV 1,000 mg  1,000 mg Intravenous Q6H Hooten, Laurice Record, MD      . acetaminophen (TYLENOL) tablet 650 mg  650 mg Oral Q6H PRN Hooten, Laurice Record, MD       Or  . acetaminophen (TYLENOL) suppository 650 mg  650 mg Rectal Q6H PRN Hooten, Laurice Record, MD      . alum & mag hydroxide-simeth (MAALOX/MYLANTA) 200-200-20 MG/5ML suspension 30 mL  30 mL Oral Q4H PRN Hooten, Laurice Record, MD      . apixaban (ELIQUIS) tablet 5 mg  5 mg Oral BID Hooten, Laurice Record, MD      . bisacodyl (DULCOLAX) suppository 10 mg  10 mg Rectal Daily PRN Hooten, Laurice Record, MD      . celecoxib (CELEBREX) capsule 200 mg  200 mg Oral Q12H Hooten, Laurice Record, MD      . clindamycin (CLEOCIN) IVPB 600 mg  600 mg  Intravenous Q6H Hooten, Laurice Record, MD      . diphenhydrAMINE (BENADRYL) 12.5 MG/5ML elixir 12.5-25 mg  12.5-25 mg Oral Q4H PRN Hooten, Laurice Record, MD      . ferrous sulfate tablet 325 mg  325 mg Oral BID WC Hooten, Laurice Record, MD      . furosemide (LASIX) tablet 20 mg  20 mg Oral Daily Hooten, Laurice Record, MD      . hydrochlorothiazide (MICROZIDE) capsule 12.5 mg  12.5 mg Oral Daily Hooten, Laurice Record, MD      . losartan (COZAAR) tablet 50 mg  50 mg Oral Daily Hooten, Laurice Record, MD      . magnesium hydroxide (MILK OF MAGNESIA) suspension 30 mL  30 mL Oral Daily PRN Hooten, Laurice Record, MD      . menthol-cetylpyridinium (CEPACOL) lozenge 3 mg  1 lozenge Oral PRN Hooten, Laurice Record, MD       Or  . phenol (CHLORASEPTIC) mouth spray 1 spray  1 spray Mouth/Throat PRN Hooten, Laurice Record, MD      . morphine 2 MG/ML injection 2 mg  2 mg Intravenous Q2H PRN Hooten, Laurice Record, MD      . multivitamin with minerals tablet 1 tablet  1 tablet Oral Daily Hooten, Laurice Record, MD      . ondansetron (ZOFRAN) 4 MG/2ML injection           . ondansetron (ZOFRAN) tablet 4 mg  4 mg Oral Q6H PRN Hooten, Laurice Record, MD       Or  . ondansetron (ZOFRAN) injection 4 mg  4 mg Intravenous Q6H PRN Hooten, Laurice Record, MD      . oxyCODONE (Oxy IR/ROXICODONE) immediate release tablet 5-10 mg  5-10 mg Oral Q4H PRN Hooten, Laurice Record, MD      . pantoprazole (PROTONIX) EC tablet 40 mg  40 mg Oral BID Hooten, Laurice Record, MD      . senna-docusate (Senokot-S) tablet 1 tablet  1 tablet Oral BID Hooten, Laurice Record, MD      . sodium phosphate (FLEET) 7-19 GM/118ML enema 1 enema  1 enema Rectal Once PRN Hooten, Laurice Record, MD      . sotalol (BETAPACE) tablet 80 mg  80 mg Oral Q12H Hooten, Laurice Record, MD      . traMADol Veatrice Bourbon) tablet 50-100 mg  50-100 mg Oral Q4H PRN Hooten, Laurice Record, MD         Discharge Medications: Please see discharge summary for a list of discharge medications.  Relevant Imaging Results:  Relevant Lab Results:   Additional Information  (SSN: [id number].)  Sibley Rolison, Veronia Beets, LCSW

## 2017-09-07 NOTE — Progress Notes (Signed)
ANTICOAGULATION CONSULT NOTE - Initial Consult  Pharmacy Consult for Eliquis  Indication: atrial fibrillation  Allergies  Allergen Reactions  . Tdap [Diphth-Acell Pertussis-Tetanus] Shortness Of Breath  . Penicillins Rash    Has patient had a PCN reaction causing immediate rash, facial/tongue/throat swelling, SOB or lightheadedness with hypotension: Yes Has patient had a PCN reaction causing severe rash involving mucus membranes or skin necrosis: No Has patient had a PCN reaction that required hospitalization: No Has patient had a PCN reaction occurring within the last 10 years: No If all of the above answers are "NO", then may proceed with Cephalosporin use.     Patient Measurements: Height: 5\' 6"  (167.6 cm) Weight: 242 lb (109.8 kg) IBW/kg (Calculated) : 59.3 Heparin Dosing Weight:   Vital Signs: Temp: 97 F (36.1 C) (10/15 1604) Temp Source: Oral (10/15 1011) BP: 140/70 (10/15 1604) Pulse Rate: 55 (10/15 1604)  Labs: No results for input(s): HGB, HCT, PLT, APTT, LABPROT, INR, HEPARINUNFRC, HEPRLOWMOCWT, CREATININE, CKTOTAL, CKMB, TROPONINI in the last 72 hours.  Estimated Creatinine Clearance: 98.5 mL/min (by C-G formula based on SCr of 0.71 mg/dL).   Medical History: Past Medical History:  Diagnosis Date  . Atrial fibrillation ([place])   . Breast cancer ([place]) 1990/1997    left mastectomy ,had recurrence in left suprclavic nodes several yrs ago-treated with radiation and chemo  . Colon polyp 2008  . Complication of anesthesia   . Diabetes mellitus without complication ([facility])    history of, loss of 105 lbs  . Difficult intubation    expectorated blood 3-4 days after surgery in 2011  . Difficult intubation    hypoxia with endoscopy in 2016  . History of chemotherapy    5-FU/AC in 1990 under the care of Dr. Oliva Bustard. pt also tx with tamoxifen and arimidex  . History of radiation therapy    radiation treat at [facility]  . Hypertension 2008  . Personal history of  chemotherapy 1990   BREAST CA  . Personal history of malignant neoplasm of breast 1990    left mastectomy   . Personal history of radiation therapy 1997   BREAST CA  . Sinusitis   . Sleep apnea    history of, went away after gastric surgery    Medications:  Prescriptions Prior to Admission  Medication Sig Dispense Refill Last Dose  . ibuprofen (ADVIL,MOTRIN) 800 MG tablet Take 800 mg by mouth every 8 (eight) hours as needed for mild pain.    Past Week at Unknown time  . losartan-hydrochlorothiazide (HYZAAR) 50-12.5 MG tablet Take 1 tablet by mouth daily.   09/06/2017 at Unknown time  . Multiple Vitamin (MULTIVITAMIN WITH MINERALS) TABS tablet Take 1 tablet by mouth daily.   09/06/2017 at Unknown time  . sotalol (BETAPACE) 80 MG tablet Take 80 mg by mouth 2 (two) times daily.   09/06/2017 at 0800  . furosemide (LASIX) 20 MG tablet Take 20 mg by mouth.   Not Taking at Unknown time    Assessment: Pt ordered Eliquis s/p total knee replacement.  Pt was on 5 mg PO BID at home for Afib.   Goal of Therapy:  prevention of thromboembolism    Plan:  Will order Eliquis 2.5 mg PO BID X 2 doses to start on 10/16 @ 0800 followed by Eliquis 5 mg PO BID to start 10/17 per [facility] Protocol.   Anthonia Monger D 09/07/2017,4:41 PM

## 2017-09-07 NOTE — Anesthesia Post-op Follow-up Note (Signed)
Anesthesia QCDR form completed.        

## 2017-09-07 NOTE — Transfer of Care (Signed)
Immediate Anesthesia Transfer of Care Note  Patient: Mandy Roberts  Procedure(s) Performed: COMPUTER ASSISTED TOTAL KNEE ARTHROPLASTY (Left Knee)  Patient Location: PACU  Anesthesia Type:Spinal  Level of Consciousness: sedated  Airway & Oxygen Therapy: Patient Spontanous Breathing and Patient connected to nasal cannula oxygen  Post-op Assessment: Report given to RN and Post -op Vital signs reviewed and stable  Post vital signs: Reviewed and stable  Last Vitals:  Vitals:   09/07/17 1011  BP: 129/82  Pulse: 71  Resp: 18  Temp: (!) 36.2 C  SpO2: 100%    Last Pain:  Vitals:   09/07/17 1011  TempSrc: Oral         Complications: No apparent anesthesia complications

## 2017-09-07 NOTE — Care Management (Addendum)
RNCM briefly met with patient to explain my role as case Freight forwarder. I have provided a list of home health care agencies for her review.  Several friends and family members are at patient's bedside. One friend has offered a front-wheeled walker for patient to use at discharge if needed. I have requested that the walker be brought in for PT to evaluate. Patient is on Eliquis so I do not expect that she will need Lovenox at discharge. Patient uses [facility] pharmacy for medications. RNCM will continue to follow.

## 2017-09-07 NOTE — Op Note (Signed)
OPERATIVE NOTE  DATE OF SURGERY:  09/07/2017  PATIENT NAME:  Mandy Roberts   DOB: 09/27/1961  MRN: [id number]  PRE-OPERATIVE DIAGNOSIS: Degenerative arthrosis of the left knee, primary  POST-OPERATIVE DIAGNOSIS:  Same  PROCEDURE:  Left total knee arthroplasty using computer-assisted navigation  SURGEON:  Marciano Sequin. M.D.  ASSISTANT:  Vance Peper, PA (present and scrubbed throughout the case, critical for assistance with exposure, retraction, instrumentation, and closure)  ANESTHESIA: spinal  ESTIMATED BLOOD LOSS: 50 mL  FLUIDS REPLACED: 1250 mL of crystalloid  TOURNIQUET TIME: 94 minutes  DRAINS: 2 medium Hemovac drains  SOFT TISSUE RELEASES: Anterior cruciate ligament, posterior cruciate ligament, deep medial collateral ligament, patellofemoral ligament  IMPLANTS UTILIZED: DePuy Attune size 6N posterior stabilized femoral component (cemented), size 5 rotating platform tibial component (cemented), 38 mm medialized dome patella (cemented), and a 5 mm stabilized rotating platform polyethylene insert.  INDICATIONS FOR SURGERY: Mandy Roberts is a 56 y.o. year old female with a long history of progressive knee pain. X-rays demonstrated severe degenerative changes in tricompartmental fashion. The patient had not seen any significant improvement despite conservative nonsurgical intervention. After discussion of the risks and benefits of surgical intervention, the patient expressed understanding of the risks benefits and agree with plans for total knee arthroplasty.   The risks, benefits, and alternatives were discussed at length including but not limited to the risks of infection, bleeding, nerve injury, stiffness, blood clots, the need for revision surgery, cardiopulmonary complications, among others, and they were willing to proceed.  PROCEDURE IN DETAIL: The patient was brought into the operating room and, after adequate spinal anesthesia was achieved, a tourniquet was placed  on the patient's upper thigh. The patient's knee and leg were cleaned and prepped with alcohol and DuraPrep and draped in the usual sterile fashion. A "timeout" was performed as per usual protocol. The lower extremity was exsanguinated using an Esmarch, and the tourniquet was inflated to 300 mmHg. An anterior longitudinal incision was made followed by a standard mid vastus approach. The deep fibers of the medial collateral ligament were elevated in a subperiosteal fashion off of the medial flare of the tibia so as to maintain a continuous soft tissue sleeve. The patella was subluxed laterally and the patellofemoral ligament was incised. Inspection of the knee demonstrated severe degenerative changes with full-thickness loss of articular cartilage. Osteophytes were debrided using a rongeur. Anterior and posterior cruciate ligaments were excised. Two 4.0 mm Schanz pins were inserted in the femur and into the tibia for attachment of the array of trackers used for computer-assisted navigation. Hip center was identified using a circumduction technique. Distal landmarks were mapped using the computer. The distal femur and proximal tibia were mapped using the computer. The distal femoral cutting guide was positioned using computer-assisted navigation so as to achieve a 5 distal valgus cut. The femur was sized and it was felt that a size 6N femoral component was appropriate. A size 6 femoral cutting guide was positioned and the anterior cut was performed and verified using the computer. This was followed by completion of the posterior and chamfer cuts. Femoral cutting guide for the central box was then positioned in the center box cut was performed.  Attention was then directed to the proximal tibia. Medial and lateral menisci were excised. The extramedullary tibial cutting guide was positioned using computer-assisted navigation so as to achieve a 0 varus-valgus alignment and 3 posterior slope. The cut was performed  and verified using the computer. The proximal tibia  was sized and it was felt that a size 5 tibial tray was appropriate. Tibial and femoral trials were inserted followed by insertion of a 5 mm polyethylene insert. This allowed for excellent mediolateral soft tissue balancing both in flexion and in full extension. Finally, the patella was cut and prepared so as to accommodate a 38 mm medialized dome patella. A patella trial was placed and the knee was placed through a range of motion with excellent patellar tracking appreciated. The femoral trial was removed after debridement of posterior osteophytes. The central post-hole for the tibial component was reamed followed by insertion of a keel punch. Tibial trials were then removed. Cut surfaces of bone were irrigated with copious amounts of normal saline with antibiotic solution using pulsatile lavage and then suctioned dry. Polymethylmethacrylate cement with gentamicinwas prepared in the usual fashion using a vacuum mixer. Cement was applied to the cut surface of the proximal tibia as well as along the undersurface of a size 5 rotating platform tibial component. Tibial component was positioned and impacted into place. Excess cement was removed using [employer] fast streamer. Cement was then applied to the cut surfaces of the femur as well as along the posterior flanges of the size 6N femoral component. The femoral component was positioned and impacted into place. Excess cement was removed using [employer] fast streamer. A 5 mm polyethylene trial was inserted and the knee was brought into full extension with steady axial compression applied. Finally, cement was applied to the backside of a 38 mm medialized dome patella and the patellar component was positioned and patellar clamp applied. Excess cement was removed using [employer] fast streamer. After adequate curing of the cement, the tourniquet was deflated after a total tourniquet time of 94 minutes. Hemostasis was achieved using  electrocautery. The knee was irrigated with copious amounts of normal saline with antibiotic solution using pulsatile lavage and then suctioned dry. 20 mL of 1.3% Exparel and 60 mL of 0.25% Marcaine in 40 mL of normal saline was injected along the posterior capsule, medial and lateral gutters, and along the arthrotomy site. A 5 mm stabilized rotating platform polyethylene insert was inserted and the knee was placed through a range of motion with excellent mediolateral soft tissue balancing appreciated and excellent patellar tracking noted. 2 medium drains were placed in the wound bed and brought out through separate stab incisions. The medial parapatellar portion of the incision was reapproximated using interrupted sutures of #1 Vicryl. Subcutaneous tissue was approximated in layers using first #0 Vicryl followed #2-0 Vicryl. The skin was approximated with skin staples. A sterile dressing was applied.  The patient tolerated the procedure well and was transported to the recovery room in stable condition.    James P. Holley Bouche., M.D.

## 2017-09-07 NOTE — Anesthesia Procedure Notes (Signed)
Spinal  Patient location during procedure: OR Start time: 09/07/2017 11:23 AM End time: 09/07/2017 11:28 AM Staffing Performed: resident/CRNA  Preanesthetic Checklist Completed: patient identified, site marked, surgical consent, pre-op evaluation, timeout performed, IV checked, risks and benefits discussed and monitors and equipment checked Spinal Block Patient position: sitting Prep: Betadine Patient monitoring: heart rate, continuous pulse ox, blood pressure and cardiac monitor Approach: midline Location: L4-5 Injection technique: single-shot Needle Needle type: Introducer and Pencan  Needle gauge: 24 G Needle length: 9 cm Additional Notes Negative paresthesia. Negative blood return. Positive free-flowing CSF. Expiration date of kit checked and confirmed. Patient tolerated procedure well, without complications.

## 2017-09-07 NOTE — Anesthesia Preprocedure Evaluation (Signed)
Anesthesia Evaluation  Patient identified by MRN, date of birth, ID band Patient awake    Reviewed: Allergy & Precautions, H&P , NPO status , Patient's Chart, lab work & pertinent test results  History of Anesthesia Complications (+) DIFFICULT AIRWAY and history of anesthetic complications  Airway Mallampati: III  TM Distance: >3 FB Neck ROM: limited    Dental  (+) Poor Dentition, Chipped   Pulmonary sleep apnea ,           Cardiovascular Exercise Tolerance: Good hypertension, (-) angina(-) Past MI and (-) DOE      Neuro/Psych    GI/Hepatic negative GI ROS,   Endo/Other  diabetes, Type 2  Renal/GU   negative genitourinary   Musculoskeletal   Abdominal   Peds  Hematology negative hematology ROS (+)   Anesthesia Other Findings Past Medical History: No date: Atrial fibrillation [facility]) 1990/1997: Breast cancer ([place])     Comment:   left mastectomy ,had recurrence in left suprclavic               nodes several yrs ago-treated with radiation and chemo 2008: Colon polyp No date: Complication of anesthesia No date: Diabetes mellitus without complication (HCC)     Comment:  history of, loss of 105 lbs No date: Difficult intubation     Comment:  expectorated blood 3-4 days after surgery in 2011 No date: Difficult intubation     Comment:  hypoxia with endoscopy in 2016 No date: History of chemotherapy     Comment:  5-FU/AC in 1990 under the care of Dr. Oliva Bustard. pt also tx              with tamoxifen and arimidex No date: History of radiation therapy     Comment:  radiation treat at [facility] 2008: Hypertension 1990: Personal history of chemotherapy     Comment:  BREAST CA 1990: Personal history of malignant neoplasm of breast     Comment:   left mastectomy  1997: Personal history of radiation therapy     Comment:  BREAST CA No date: Sinusitis No date: Sleep apnea     Comment:  history of, went away after gastric  surgery  Past Surgical History: 1996: ABDOMINAL HYSTERECTOMY 2008: COLONOSCOPY     Comment:  Dr. Jamal Collin No date: DILATION AND CURETTAGE OF UTERUS No date: KNEE ARTHROSCOPY; Bilateral 2009, 2010, 2012: [place]; Left 04/17/2015: LAPAROSCOPIC GASTRIC RESTRICTIVE DUODENAL PROCEDURE  (DUODENAL SWITCH); N/A     Comment:  Procedure: LAPAROSCOPIC GASTRIC RESTRICTIVE DUODENAL               PROCEDURE, single anastamosis ;  Surgeon: Bonner Puna,               MD;  Location: [facility] ORS;  Service: General;  Laterality:               N/A; 1990: MASTECTOMY; Left     Comment:  BREAST CA No date: TONSILLECTOMY 04/17/2015: UPPER GI ENDOSCOPY; N/A     Comment:  Procedure: UPPER GI ENDOSCOPY;  Surgeon: Bonner Puna,               MD;  Location: [facility] ORS;  Service: General;  Laterality:               N/A;  BMI    Body Mass Index:  39.06 kg/m      Reproductive/Obstetrics (+) Pregnancy  Anesthesia Physical Anesthesia Plan  ASA: III  Anesthesia Plan: Spinal   Post-op Pain Management:    Induction:   PONV Risk Score and Plan: Propofol infusion  Airway Management Planned: Natural Airway and Nasal Cannula  Additional Equipment:   Intra-op Plan:   Post-operative Plan:   Informed Consent: I have reviewed the patients History and Physical, chart, labs and discussed the procedure including the risks, benefits and alternatives for the proposed anesthesia with the patient or authorized representative who has indicated his/her understanding and acceptance.   Dental Advisory Given  Plan Discussed with: Anesthesiologist, CRNA and Surgeon  Anesthesia Plan Comments: (Patient reports no bleeding problems and no anticoagulant use.  Plan for spinal with backup GA  Patient consented for risks of anesthesia including but not limited to:  - adverse reactions to medications - risk of bleeding, infection, nerve damage and headache - risk of failed  spinal - damage to teeth, lips or other oral mucosa - sore throat or hoarseness - Damage to heart, brain, lungs or loss of life  Patient voiced understanding.)        Anesthesia Quick Evaluation

## 2017-09-08 ENCOUNTER — Encounter: Payer: Self-pay | Admitting: Orthopedic Surgery

## 2017-09-08 LAB — BASIC METABOLIC PANEL
Anion gap: 4 — ABNORMAL LOW (ref 5–15)
BUN: 12 mg/dL (ref 6–20)
CALCIUM: 8.8 mg/dL — AB (ref 8.9–10.3)
CO2: 28 mmol/L (ref 22–32)
CREATININE: 0.79 mg/dL (ref 0.44–1.00)
Chloride: 111 mmol/L (ref 101–111)
GFR calc Af Amer: >60 mL/min (ref >60)
GFR calc non Af Amer: >60 mL/min (ref >60)
GLUCOSE: 150 mg/dL — AB (ref 65–99)
Potassium: 4.1 mmol/L (ref 3.5–5.1)
Sodium: 143 mmol/L (ref 135–145)

## 2017-09-08 LAB — CBC
HEMATOCRIT: 37.9 % (ref 35.0–47.0)
Hemoglobin: 12.3 g/dL (ref 12.0–16.0)
MCH: 29.4 pg (ref 26.0–34.0)
MCHC: 32.4 g/dL (ref 32.0–36.0)
MCV: 90.6 fL (ref 80.0–100.0)
Platelets: 137 10*3/uL — ABNORMAL LOW (ref 150–440)
RBC: 4.18 MIL/uL (ref 3.80–5.20)
RDW: 14 % (ref 11.5–14.5)
WBC: 7.5 10*3/uL (ref 3.6–11.0)

## 2017-09-08 MED ORDER — ONDANSETRON HCL 4 MG/2ML IJ SOLN
4.0000 mg | INTRAMUSCULAR | Status: DC | PRN
  Administered 2017-09-08: 4 mg via INTRAVENOUS

## 2017-09-08 MED ORDER — ONDANSETRON HCL 4 MG PO TABS
4.0000 mg | ORAL_TABLET | Freq: Four times a day (QID) | ORAL | Status: DC | PRN
  Administered 2017-09-08: 4 mg via ORAL
  Filled 2017-09-08: qty 1

## 2017-09-08 MED ORDER — OXYCODONE HCL 5 MG PO TABS: 5.0000 mg | ORAL_TABLET | ORAL | 0 refills | Status: DC | PRN

## 2017-09-08 MED ORDER — TRAMADOL HCL 50 MG PO TABS: 50.0000 mg | ORAL_TABLET | ORAL | 0 refills | Status: DC | PRN

## 2017-09-08 NOTE — Discharge Instructions (Signed)
Instructions after Total Knee Replacement   Mandy Roberts., M.D.     Dept. of [facility]  [address], [place]  [address]  Phone: [phone]   Fax: [phone]    DIET:  Drink plenty of non-alcoholic fluids.  Resume your normal diet. Include foods high in fiber.  ACTIVITY:   You may use crutches or a walker with weight-bearing as tolerated, unless instructed otherwise.  You may be weaned off of the walker or crutches by your Physical Therapist.   Do NOT place pillows under the knee. Anything placed under the knee could limit your ability to straighten the knee.    Continue doing gentle exercises. Exercising will reduce the pain and swelling, increase motion, and prevent muscle weakness.    Please continue to use the TED compression stockings for 6 weeks. You may remove the stockings at night, but should reapply them in the morning.  Do not drive or operate any equipment until instructed.  WOUND CARE:   Continue to use the PolarCare or ice packs periodically to reduce pain and swelling.  You may bathe or shower after the staples are removed at the first office visit following surgery.  MEDICATIONS:  You may resume your regular medications.  Please take the pain medication as prescribed on the medication.  Do not take pain medication on an empty stomach.  You have been given a prescription for a blood thinner (Lovenox or Coumadin). Please take the medication as instructed. (NOTE: After completing a 2 week course of Lovenox, take one Enteric-coated aspirin once a day. This along with elevation will help reduce the possibility of phlebitis in your operated leg.)  Do not drive or drink alcoholic beverages when taking pain medications.  CALL THE OFFICE FOR:  Temperature above 101 degrees  Excessive bleeding or drainage on the dressing.  Excessive swelling, coldness, or paleness of the toes.  Persistent  nausea and vomiting.  FOLLOW-UP:   You should have an appointment to return to the office in 10-14 days after surgery.  Arrangements have been made for continuation of Physical Therapy (either home therapy or outpatient therapy).     TOTAL KNEE REPLACEMENT POSTOPERATIVE DIRECTIONS  Knee Rehabilitation, Guidelines Following Surgery  Results after knee surgery are often greatly improved when you follow the exercise, range of motion and muscle strengthening exercises prescribed by your doctor. Safety measures are also important to protect the knee from further injury. Any time any of these exercises cause you to have increased pain or swelling in your knee joint, decrease the amount until you are comfortable again and slowly increase them. If you have problems or questions, call your caregiver or physical therapist for advice.   HOME CARE INSTRUCTIONS  Remove items at home which could result in a fall. This includes throw rugs or furniture in walking pathways.   Continue to use polar care unit on the knee for pain and swelling from surgery. You may notice swelling that will progress down to the foot and ankle.  This is normal after surgery.  Elevate the leg when you are not up walking on it.    Continue to use the breathing machine which will help keep your temperature down.  It is common for your temperature to cycle up and down following surgery, especially at night when you are not up moving around and exerting yourself.  The breathing machine keeps your lungs expanded and your temperature down.  Do not  place pillow under knee, focus on keeping the knee STRAIGHT while resting  DIET You may resume your previous home diet once your are discharged from the hospital.  DRESSING / WOUND CARE / SHOWERING You may start showering once staples have been removed. Change the surgical dressing as needed.  STAPLE REMOVAL: Staples are to be removed at two weeks post op and apply benzoin and 1/2 inch  steri strips  ACTIVITY Walk with your walker as instructed. Use walker as long as suggested by your caregivers. Avoid periods of inactivity such as sitting longer than an hour when not asleep. This helps prevent blood clots.  You may resume a sexual relationship in one month or when given the OK by your doctor.  You may return to work once you are cleared by your doctor.  Do not drive a car for 6 weeks or until released by you surgeon.  Do not drive while taking narcotics.  WEIGHT BEARING Weight bearing as tolerated with assist device (walker, cane, etc) as directed, use it as long as suggested by your surgeon or therapist, typically at least 4-6 weeks.  POSTOPERATIVE CONSTIPATION PROTOCOL Constipation - defined medically as fewer than three stools per week and severe constipation as less than one stool per week.  One of the most common issues patients have following surgery is constipation.  Even if you have a regular bowel pattern at home, your normal regimen is likely to be disrupted due to multiple reasons following surgery.  Combination of anesthesia, postoperative narcotics, change in appetite and fluid intake all can affect your bowels.  In order to avoid complications following surgery, here are some recommendations in order to help you during your recovery period.  Colace (docusate) - Pick up an over-the-counter form of Colace or another stool softener and take twice a day as long as you are requiring postoperative pain medications.  Take with a full glass of water daily.  If you experience loose stools or diarrhea, hold the colace until you stool forms back up.  If your symptoms do not get better within 1 week or if they get worse, check with your doctor.  Dulcolax (bisacodyl) - Pick up over-the-counter and take as directed by the product packaging as needed to assist with the movement of your bowels.  Take with a full glass of water.  Use this product as needed if not relieved by  Colace only.   MiraLax (polyethylene glycol) - Pick up over-the-counter to have on hand.  MiraLax is a solution that will increase the amount of water in your bowels to assist with bowel movements.  Take as directed and can mix with a glass of water, juice, soda, coffee, or tea.  Take if you go more than two days without a movement. Do not use MiraLax more than once per day. Call your doctor if you are still constipated or irregular after using this medication for 7 days in a row.  If you continue to have problems with postoperative constipation, please contact the office for further assistance and recommendations.  If you experience "the worst abdominal pain ever" or develop nausea or vomiting, please contact the office immediatly for further recommendations for treatment.  ITCHING  If you experience itching with your medications, try taking only a single pain pill, or even half a pain pill at a time.  You can also use Benadryl over the counter for itching or also to help with sleep.   TED HOSE STOCKINGS Wear the elastic stockings  on both legs for  6 weeks following surgery during the day but you may remove then at night for sleeping.  MEDICATIONS See your medication summary on the After Visit Summary that the nursing staff will review with you prior to discharge.  You may have some home medications which will be placed on hold until you complete the course of blood thinner medication.  It is important for you to complete the blood thinner medication as prescribed by your surgeon.  Continue your approved medications as instructed at time of discharge.  PRECAUTIONS If you experience chest pain or shortness of breath - call 911 immediately for transfer to the hospital emergency department.  If you develop a fever greater that 101 F, purulent drainage from wound, increased redness or drainage from wound, foul odor from the wound/dressing, or calf pain - CONTACT YOUR SURGEON.                                                    FOLLOW-UP APPOINTMENTS Make sure you keep all of your appointments after your operation with your surgeon and caregivers. You should call the office at the above phone number and make an appointment for approximately two weeks after the date of your surgery or on the date instructed by your surgeon outlined in the "After Visit Summary".   RANGE OF MOTION AND STRENGTHENING EXERCISES  Rehabilitation of the knee is important following a knee injury or an operation. After just a few days of immobilization, the muscles of the thigh which control the knee become weakened and shrink (atrophy). Knee exercises are designed to build up the tone and strength of the thigh muscles and to improve knee motion. Often times heat used for twenty to thirty minutes before working out will loosen up your tissues and help with improving the range of motion but do not use heat for the first two weeks following surgery. These exercises can be done on a training (exercise) mat, on the floor, on a table or on a bed. Use what ever works the best and is most comfortable for you Knee exercises include:  Leg Lifts - While your knee is still immobilized in a splint or cast, you can do straight leg raises. Lift the leg to 60 degrees, hold for 3 sec, and slowly lower the leg. Repeat 10-20 times 2-3 times daily. Perform this exercise against resistance later as your knee gets better.  Quad and Hamstring Sets - Tighten up the muscle on the front of the thigh (Quad) and hold for 5-10 sec. Repeat this 10-20 times hourly. Hamstring sets are done by pushing the foot backward against an object and holding for 5-10 sec. Repeat as with quad sets.   Leg Slides: Lying on your back, slowly slide your foot toward your buttocks, bending your knee up off the floor (only go as far as is comfortable). Then slowly slide your foot back down until your leg is flat on the floor again.  Angel Wings: Lying on your back spread your  legs to the side as far apart as you can without causing discomfort.  A rehabilitation program following serious knee injuries can speed recovery and prevent re-injury in the future due to weakened muscles. Contact your doctor or a physical therapist for more information on knee rehabilitation.   IF YOU ARE TRANSFERRED TO  A SKILLED REHAB FACILITY If the patient is transferred to a skilled rehab facility following release from the hospital, a list of the current medications will be sent to the facility for the patient to continue.  When discharged from the skilled rehab facility, please have the facility set up the patient's [place] prior to being released. Also, the skilled facility will be responsible for providing the patient with their medications at time of release from the facility to include their pain medication, the muscle relaxants, and their blood thinner medication. If the patient is still at the rehab facility at time of the two week follow up appointment, the skilled rehab facility will also need to assist the patient in arranging follow up appointment in our office and any transportation needs.  MAKE SURE YOU:  Understand these instructions.  Get help right away if you are not doing well or get worse.     Information on my medicine - ELIQUIS (apixaban)  This medication education was reviewed with me or my healthcare representative as part of my discharge preparation.  The pharmacist that spoke with me during my hospital stay was:  Candelaria Stagers, [facility]  Why was Eliquis prescribed for you? Eliquis was prescribed for you to reduce the risk of forming blood clots that can cause a stroke if you have a medical condition called atrial fibrillation (a type of irregular heartbeat) OR to reduce the risk of a blood clots forming after orthopedic surgery.  What do You need to know about Eliquis ? Take your Eliquis TWICE DAILY - one tablet in the morning and one tablet  in the evening with or without food.  It would be best to take the doses about the same time each day.  If you have difficulty swallowing the tablet whole please discuss with your pharmacist how to take the medication safely.  Take Eliquis exactly as prescribed by your doctor and DO NOT stop taking Eliquis without talking to the doctor who prescribed the medication.  Stopping may increase your risk of developing a new clot or stroke.  Refill your prescription before you run out.  After discharge, you should have regular check-up appointments with your healthcare provider that is prescribing your Eliquis.  In the future your dose may need to be changed if your kidney function or weight changes by a significant amount or as you get older.  What do you do if you miss a dose? If you miss a dose, take it as soon as you remember on the same day and resume taking twice daily.  Do not take more than one dose of ELIQUIS at the same time.  Important Safety Information A possible side effect of Eliquis is bleeding. You should call your healthcare provider right away if you experience any of the following: ? Bleeding from an injury or your nose that does not stop. ? Unusual colored urine (red or dark brown) or unusual colored stools (red or black). ? Unusual bruising for unknown reasons. ? A serious fall or if you hit your head (even if there is no bleeding).  Some medicines may interact with Eliquis and might increase your risk of bleeding or clotting while on Eliquis. To help avoid this, consult your healthcare provider or pharmacist prior to using any new prescription or non-prescription medications, including herbals, vitamins, non-steroidal anti-inflammatory drugs (NSAIDs) and supplements.  This website has more information on Eliquis (apixaban): [email or web address].

## 2017-09-08 NOTE — Discharge Summary (Signed)
Physician Discharge Summary  Patient ID: Mandy Roberts MRN: [id number] DOB/AGE: [birth year] 56 y.o.  Admit date: 09/07/2017 Discharge date: 09/09/2017  Admission Diagnoses:  primary osteoarthritis of left knee   Discharge Diagnoses: Patient Active Problem List   Diagnosis Date Noted  . 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 Difficult airway for intubation 04/09/2015  . Chronic nasal congestion 04/09/2015  . Atrial fibrillation ([place]) 09/16/2013  . Obstructive sleep apnea 09/16/2013  . Morbid obesity ([place]) 09/16/2013  . Personal history of breast cancer 02/09/2013  . Hypertension   . Cancer [facility])     Past Medical History:  Diagnosis Date  . Atrial fibrillation ([place])   . Breast cancer ([place]) 1990/1997    left mastectomy ,had recurrence in left suprclavic nodes several yrs ago-treated with radiation and chemo  . Colon polyp 2008  . Complication of anesthesia   . Diabetes mellitus without complication ([facility])    history of, loss of 105 lbs  . Difficult intubation    expectorated blood 3-4 days after surgery in 2011  . Difficult intubation    hypoxia with endoscopy in 2016  . History of chemotherapy    5-FU/AC in 1990 under the care of Dr. Oliva Bustard. pt also tx with tamoxifen and arimidex  . History of radiation therapy    radiation treat at [facility]  . Hypertension 2008  . Personal history of chemotherapy 1990   BREAST CA  . Personal history of malignant neoplasm of breast 1990    left mastectomy   . Personal history of radiation therapy 1997   BREAST CA  . Sinusitis   . Sleep apnea    history of, went away after gastric surgery     Transfusion: none   Consultants (if any):   Discharged Condition: Improved  Hospital Course: Mandy Roberts is an 56 y.o. female who was admitted 09/07/2017 with a diagnosis of degenerative arthrosis left knee and went to the operating room on 09/07/2017 and underwent the above named procedures.    Surgeries:Procedure(s): COMPUTER  ASSISTED TOTAL KNEE ARTHROPLASTY on 09/07/2017  PRE-OPERATIVE DIAGNOSIS: Degenerative arthrosis of the left knee, primary  POST-OPERATIVE DIAGNOSIS:  Same  PROCEDURE:  Left total knee arthroplasty using computer-assisted navigation  SURGEON:  Marciano Sequin. M.D.  ASSISTANT:  Vance Peper, PA (present and scrubbed throughout the case, critical for assistance with exposure, retraction, instrumentation, and closure)  ANESTHESIA: spinal  ESTIMATED BLOOD LOSS: 50 mL  FLUIDS REPLACED: 1250 mL of crystalloid  TOURNIQUET TIME: 94 minutes  DRAINS: 2 medium Hemovac drains  SOFT TISSUE RELEASES: Anterior cruciate ligament, posterior cruciate ligament, deep medial collateral ligament, patellofemoral ligament  IMPLANTS UTILIZED: DePuy Attune size 6N posterior stabilized femoral component (cemented), size 5 rotating platform tibial component (cemented), 38 mm medialized dome patella (cemented), and a 5 mm stabilized rotating platform polyethylene insert.  INDICATIONS FOR SURGERY: Mandy Roberts is a 56 y.o. year old female with a long history of progressive knee pain. X-rays demonstrated severe degenerative changes in tricompartmental fashion. The patient had not seen any significant improvement despite conservative nonsurgical intervention. After discussion of the risks and benefits of surgical intervention, the patient expressed understanding of the risks benefits and agree with plans for total knee arthroplasty.   The risks, benefits, and alternatives were discussed at length including but not limited to the risks of infection, bleeding, nerve injury, stiffness, blood clots, the need for revision surgery, cardiopulmonary complications, among others, and they were willing to proceed. Patient tolerated the surgery well.  No complications .Patient was taken to PACU where she was stabilized and then transferred to the orthopedic floor.  Patient started on Eliquis. Foot pumps applied  bilaterally at 80 mm hgb. Heels elevated off bed with rolled towels. No evidence of DVT. Calves non tender. Negative [place]oman. Physical therapy started on day #1 for gait training and transfer with OT starting on  day #1 for ADL and assisted devices. Patient has done well with therapy. Ambulated greater than 200 feet upon being discharged. Able to ascend and descend 4 steps safely and independently.  Patient's IV And Foley were discontinued on day #1 with Hemovac being discontinued on day #2. Dressing was changed on day 2 prior to patient being discharged   She was given perioperative antibiotics:  Anti-infectives    Start     Dose/Rate Route Frequency Ordered Stop   09/07/17 1800  clindamycin (CLEOCIN) IVPB 600 mg     600 mg 100 mL/hr over 30 Minutes Intravenous Every 6 hours 09/07/17 1631 09/08/17 1759   09/07/17 1012  clindamycin (CLEOCIN) 900 MG/50ML IVPB    Comments:  Slemenda, Debra   : cabinet override      09/07/17 1012 09/07/17 1138   09/07/17 0258  clindamycin (CLEOCIN) IVPB 900 mg     900 mg 100 mL/hr over 30 Minutes Intravenous On call to O.R. 09/07/17 0258 09/07/17 1149    .  She was fitted with AV 1 compression foot pump devices, instructed on heel pumps, early ambulation, and fitted with TED stockings bilaterally for DVT prophylaxis.  She benefited maximally from the hospital stay and there were no complications.    Recent vital signs:  Vitals:   09/08/17 0053 09/08/17 0524  BP: (!) 100/45 (!) 110/45  Pulse: (!) 51 (!) 50  Resp:  16  Temp: (!) 97.5 F (36.4 C) 98 F (36.7 C)  SpO2: 95% 97%    Recent laboratory studies:  Lab Results  Component Value Date   HGB 12.3 09/08/2017   HGB 12.5 08/26/2017   HGB 13.1 04/18/2015   Lab Results  Component Value Date   WBC 7.5 09/08/2017   PLT 137 (L) 09/08/2017   Lab Results  Component Value Date   INR 1.10 08/26/2017   Lab Results  Component Value Date   NA 143 09/08/2017   K 4.1 09/08/2017   CL 111 09/08/2017    CO2 28 09/08/2017   BUN 12 09/08/2017   CREATININE 0.79 09/08/2017   GLUCOSE 150 (H) 09/08/2017    Discharge Medications:   Allergies as of 09/08/2017      Reactions   Tdap [diphth-acell Pertussis-tetanus] Shortness Of Breath   Penicillins Rash   Has patient had a PCN reaction causing immediate rash, facial/tongue/throat swelling, SOB or lightheadedness with hypotension: Yes Has patient had a PCN reaction causing severe rash involving mucus membranes or skin necrosis: No Has patient had a PCN reaction that required hospitalization: No Has patient had a PCN reaction occurring within the last 10 years: No If all of the above answers are "NO", then may proceed with Cephalosporin use.      Medication List    STOP taking these medications   ibuprofen 800 MG tablet Commonly known as:  ADVIL,MOTRIN     TAKE these medications   furosemide 20 MG tablet Commonly known as:  LASIX Take 20 mg by mouth.   losartan-hydrochlorothiazide 50-12.5 MG tablet Commonly known as:  HYZAAR Take 1 tablet by mouth daily.   multivitamin with minerals Tabs  tablet Take 1 tablet by mouth daily.   oxyCODONE 5 MG immediate release tablet Commonly known as:  Oxy IR/ROXICODONE Take 1-2 tablets (5-10 mg total) by mouth every 4 (four) hours as needed for severe pain or breakthrough pain.   sotalol 80 MG tablet Commonly known as:  BETAPACE Take 80 mg by mouth 2 (two) times daily.   traMADol 50 MG tablet Commonly known as:  ULTRAM Take 1-2 tablets (50-100 mg total) by mouth every 4 (four) hours as needed for moderate pain.            Durable Medical Equipment        Start     Ordered   09/07/17 1632  DME Walker rolling  Once    Question:  Patient needs a walker to treat with the following condition  Answer:  Total knee replacement status   09/07/17 1631   09/07/17 1632  DME Bedside commode  Once    Question:  Patient needs a bedside commode to treat with the following condition  Answer:   Total knee replacement status   09/07/17 1631      Diagnostic Studies: Dg Knee Left Port  Result Date: 09/07/2017 CLINICAL DATA:  Post LEFT total knee replacement EXAM: PORTABLE LEFT KNEE - 1-2 VIEW COMPARISON:  Portable exam 7829 hours compared to MRI LEFT knee 10/12/2010 FINDINGS: Components of a LEFT knee prosthesis are identified. Bones demineralized. No acute fracture, dislocation, or bone destruction. No periprosthetic lucency. Anterior surgical drains and skin clips noted. IMPRESSION: Osseous demineralization and LEFT knee prosthesis without acute bony abnormalities. Electronically Signed   By: Lavonia Dana M.D.   On: 09/07/2017 15:27    Disposition: 01-Home or Self Care  Discharge Instructions    Diet - low sodium heart healthy    Complete by:  As directed    Increase activity slowly    Complete by:  As directed       Follow-up Information    Watt Climes, PA On 09/22/2017.   Specialty:  Physician Assistant Why:  at 10:15am Contact information: [place] [address] [phone]        Dereck Leep, MD On 10/20/2017.   Specialty:  Orthopedic Surgery Why:  at 10:45am Contact information: [place] [address] [phone]            Signed: Watt Climes 09/08/2017, 7:46 AM

## 2017-09-08 NOTE — Progress Notes (Signed)
Physical Therapy Treatment Patient Details Name: Mandy Roberts MRN: [id number] DOB: [birth year] Today's Date: 09/08/2017    History of Present Illness Pt is a 56 y/o F s/p L TKA.  Pt's PMH includes breast cancer, a-fib.    PT Comments    Mandy Roberts made excellent progress with mobility, ambulating 425 ft with RW and supervision for safety.  She progressed to a step through gait pattern this session.  She continues to require min guard assist for sit>stand transfers.  Pt will need to complete stair training prior to d/c.  Pt will benefit from continued skilled PT services to increase functional independence and safety.    Follow Up Recommendations  Home health PT;Supervision for mobility/OOB     Equipment Recommendations  Rolling walker with 5" wheels    Recommendations for Other Services       Precautions / Restrictions Precautions Precautions: Fall;Knee Precaution Booklet Issued: No Precaution Comments: Reviewed no pillow under knee Required Braces or Orthoses: Knee Immobilizer - Left Knee Immobilizer - Left: Other (comment) (if unable to perform SLR) Restrictions Weight Bearing Restrictions: Yes LLE Weight Bearing: Weight bearing as tolerated    Mobility  Bed Mobility Overal bed mobility: Needs Assistance Bed Mobility: Supine to Sit     Supine to sit: Supervision     General bed mobility comments: Supervision for safety.  Pt reports mild dizziness with supine>sit that resolves within seconds.    Transfers Overall transfer level: Needs assistance Equipment used: Rolling walker (2 wheeled) Transfers: Sit to/from Stand Sit to Stand: Min guard         General transfer comment: Pt performs sit>stand with safe technique but mild instability, thus min guard assist was provided.  Pt demonstrates good eccentric control to sit.   Ambulation/Gait Ambulation/Gait assistance: Supervision Ambulation Distance (Feet): 425 Feet Assistive device: Rolling walker (2  wheeled) Gait Pattern/deviations: Step-to pattern;Decreased stance time - left;Decreased step length - right;Decreased weight shift to left;Antalgic;Trunk flexed;Step-through pattern Gait velocity: decreased Gait velocity interpretation: Below normal speed for age/gender General Gait Details: Cues for forward gaze and demonstration and cues for step through gait pattern which pt performs well.  No complaints of dizziness or diaphoresis while ambulating this session.    Stairs            Wheelchair Mobility    Modified Rankin (Stroke Patients Only)       Balance Overall balance assessment: Needs assistance Sitting-balance support: No upper extremity supported;Feet supported Sitting balance-Leahy Scale: Good     Standing balance support: During functional activity;Bilateral upper extremity supported Standing balance-Leahy Scale: Fair Standing balance comment: Pt able to stand statically without UE support but relies on UE support for dynamic activities.                            Cognition Arousal/Alertness: Awake/alert Behavior During Therapy: WFL for tasks assessed/performed Overall Cognitive Status: Within Functional Limits for tasks assessed                                 General Comments: fairly sleepy      Exercises Total Joint Exercises Quad Sets: Strengthening;Both;10 reps;Supine;Other (comment) (while pt in bone foam) Long Arc Quad: Strengthening;Left;10 reps;Seated Knee Flexion: AAROM;Left;Seated;Other (comment);10 reps (with 5 second holds)    General Comments        Pertinent Vitals/Pain Pain Assessment: No/denies pain    Home  Living Family/patient expects to be discharged to:: Private residence Living Arrangements: Other relatives (brother and housemate) Available Help at Discharge: Family;Available 24 hours/day Type of Home: House Home Access: Stairs to enter Entrance Stairs-Rails: Right Home Layout: One level Home  Equipment: Walker - 4 wheels;Wheelchair - manual;Cane - single point      Prior Function Level of Independence: Independent      Comments: Pt ambulates without AD and denies any falls in the past 6 months.  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 Independent with all ADLs.   PT Goals (current goals can now be found in the care plan section) Acute Rehab PT Goals Patient Stated Goal: to go home and get stronger PT Goal Formulation: With patient Time For Goal Achievement: 09/22/17 Potential to Achieve Goals: Good Progress towards PT goals: Progressing toward goals    Frequency    BID      PT Plan Current plan remains appropriate    Co-evaluation              AM-PAC PT "6 Clicks" Daily Activity  Outcome Measure  Difficulty turning over in bed (including adjusting bedclothes, sheets and blankets)?: A Little Difficulty moving from lying on back to sitting on the side of the bed? : A Little Difficulty sitting down on and standing up from a chair with arms (e.g., wheelchair, bedside commode, etc,.)?: A Lot Help needed moving to and from a bed to chair (including a wheelchair)?: A Little Help needed walking in hospital room?: A Little Help needed climbing 3-5 steps with a railing? : A Little 6 Click Score: 17    End of Session Equipment Utilized During Treatment: Gait belt Activity Tolerance: Patient tolerated treatment well Patient left: in chair;with call bell/phone within reach;with chair alarm set;Other (comment);with family/visitor present (polar care and bone foam in place) Nurse Communication: Mobility status PT Visit Diagnosis: Pain;Unsteadiness on feet (R26.81);Other abnormalities of gait and mobility (R26.89);Muscle weakness (generalized) (M62.81) Pain - Right/Left: Left Pain - part of body: Knee     Time: 0923-3007 PT Time Calculation (min) (ACUTE ONLY): 23 min  Charges:  $Gait Training: 8-22  mins $Therapeutic Exercise: 8-22 mins                    G Codes:       Mandy Roberts PT, DPT 09/08/2017, 4:01 PM

## 2017-09-08 NOTE — Progress Notes (Signed)
Clinical Social Worker (CSW) received SNF consult. PT is recommending home health. RN case manager aware of above. Please reconsult if future social work needs arise. CSW signing off.   Albena Comes, LCSW [phone] 

## 2017-09-08 NOTE — Evaluation (Signed)
Occupational Therapy Evaluation Patient Details Name: Mandy Roberts MRN: [id number] DOB: February 04, 1961 Today's Date: 09/08/2017    History of Present Illness Pt is a 56 y/o F s/p L TKA.  Pt's PMH includes breast cancer, a-fib.   Clinical Impression   Pt seen for brief OT evaluation this date. Pt experiencing severe nausea this date but agreeable to bed level evaluation. Pt independent at baseline, working full time and driving prior to surgery. Pt reporting 0/10 pain in L knee upon arrival, but limited with mobility and self care skills this date by nausea. Reports recently receiving medication for this. Per PT evaluation this am, pt generally min guard level assist for mobility with RW. Pt currently at general min assist level for LB ADL tasks. Introduced AD for LB dressing. Pt would benefit from additional training to ensure recall and carryover of learned techniques and trial with equipment as well as falls prevention education and home/routine modifications to maximize safety and functional independence. Do not anticipate any OT needs once nausea subsides. Recommend supervision for mobility once at home.    Follow Up Recommendations  No OT follow up;Other (comment) (supervision for OOB/mobility)    Equipment Recommendations  Other (comment) (consider reacher, sock aid)    Recommendations for Other Services       Precautions / Restrictions Precautions Precautions: Fall;Knee Precaution Booklet Issued: No Precaution Comments: Instructed pt in no pillow under knee Required Braces or Orthoses: Knee Immobilizer - Left Knee Immobilizer - Left: Other (comment) (if unable to do SLR) Restrictions Weight Bearing Restrictions: Yes LLE Weight Bearing: Weight bearing as tolerated      Mobility Bed Mobility     General bed mobility comments: deferred due to significant nausea, please refer to PT evaluation this date.  Transfers          General transfer comment: deferred due to  significant nausea, please refer to PT evaluation this date.    Balance                           ADL either performed or assessed with clinical judgement   ADL Overall ADL's : Needs assistance/impaired             Lower Body Bathing: Sitting/lateral leans;Minimal assistance       Lower Body Dressing: Sitting/lateral leans;Minimal assistance Lower Body Dressing Details (indicate cue type and reason): Pt educated in AE for LB dressing with verbal instruction and visual demonstration. Pt verbalized understanding, too nauseous to attempt herself. Will trial next date to ensure recall and carry over of learned techniques. Toilet Transfer: Ambulation;Comfort height toilet;RW;Min guard                   Vision Baseline Vision/History: Wears glasses Wears Glasses:  (for driving) Patient Visual Report: No change from baseline Vision Assessment?: No apparent visual deficits     Perception     Praxis      Pertinent Vitals/Pain Pain Assessment: No/denies pain     Hand Dominance Right   Extremity/Trunk Assessment Upper Extremity Assessment Upper Extremity Assessment: Overall WFL for tasks assessed   Lower Extremity Assessment Lower Extremity Assessment: Defer to PT evaluation;LLE deficits/detail LLE Deficits / Details: Strength grossly at least 3/5 at hip and knee with functional observation.  Pt able to complete SLR x10.        Communication Communication Communication: No difficulties   Cognition Arousal/Alertness: Awake/alert Behavior During Therapy: WFL for tasks assessed/performed Overall Cognitive  Status: Within Functional Limits for tasks assessed                                 General Comments: fairly sleepy   General Comments      Exercises    Shoulder Instructions      Home Living Family/patient expects to be discharged to:: Private residence Living Arrangements: Other relatives (brother and housemate) Available Help  at Discharge: Family;Available 24 hours/day Type of Home: House Home Access: Stairs to enter [employer] of Steps: 4 steps in the back Entrance Stairs-Rails: Right Home Layout: One level     Bathroom Shower/Tub: Occupational psychologist: Handicapped height Bathroom Accessibility: Yes   Home Equipment: Environmental consultant - 4 wheels;Wheelchair - manual;Cane - single point          Prior Functioning/Environment Level of Independence: Independent        Comments: Pt ambulates without AD and denies any falls in the past 6 months.  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 Independent with all ADLs.        OT Problem List: Decreased strength;Decreased range of motion;Decreased knowledge of use of DME or AE      OT Treatment/Interventions: Self-care/ADL training;Therapeutic exercise;Therapeutic activities;DME and/or AE instruction;Patient/family education    OT Goals(Current goals can be found in the care plan section) Acute Rehab OT Goals Patient Stated Goal: to go home and get stronger OT Goal Formulation: With patient Time For Goal Achievement: 09/22/17 Potential to Achieve Goals: Good  OT Frequency: Min 1X/week   Barriers to D/C:            Co-evaluation              AM-PAC PT "6 Clicks" Daily Activity     Outcome Measure Help from another person eating meals?: None Help from another person taking care of personal grooming?: None Help from another person toileting, which includes using toliet, bedpan, or urinal?: A Little Help from another person bathing (including washing, rinsing, drying)?: A Little Help from another person to put on and taking off regular upper body clothing?: None Help from another person to put on and taking off regular lower body clothing?: A Little 6 Click Score: 21   End of Session Nurse Communication: Other (comment) (notified of IV pole "communication error"  status)  Activity Tolerance: Other (comment) (limited by significant nausea this date) Patient left: in bed;with call bell/phone within reach;with bed alarm set;with family/visitor present;with SCD's reapplied;Other (comment) (polar care in place)  OT Visit Diagnosis: Other abnormalities of gait and mobility (R26.89)                Time: 3149-7026 OT Time Calculation (min): 9 min Charges:  OT General Charges $OT Visit: 1 Visit OT Evaluation $OT Eval Low Complexity: 1 Low G-Codes: OT G-codes **NOT FOR INPATIENT CLASS** Functional Assessment Tool Used: AM-PAC 6 Clicks Daily Activity;Clinical judgement Functional Limitation: Self care Self Care Current Status (V7858): At least 20 percent but less than 40 percent impaired, limited or restricted Self Care Goal Status (I5027): At least 1 percent but less than 20 percent impaired, limited or restricted   Jeni Salles, MPH, MS, OTR/L ascom [phone] 09/08/17, 2:14 PM

## 2017-09-08 NOTE — Progress Notes (Signed)
Subjective: 1 Day Post-Op Procedure(s) (LRB): COMPUTER ASSISTED TOTAL KNEE ARTHROPLASTY (Left) Patient reports pain as 6 on 0-10 scale.   Patient is well, and has had no acute complaints or problems We will start therapy today.  Plan is to go Home after hospital stay. no nausea and no vomiting Patient denies any chest pains or shortness of breath. Objective: Vital signs in last 24 hours: Temp:  [97 F (36.1 C)-98 F (36.7 C)] 98 F (36.7 C) (10/16 0524) Pulse Rate:  [50-79] 50 (10/16 0524) Resp:  [9-18] 16 (10/16 0524) BP: (100-159)/(45-112) 110/45 (10/16 0524) SpO2:  [79 %-100 %] 97 % (10/16 0524) Weight:  [109.8 kg (242 lb)] 109.8 kg (242 lb) (10/15 1011) Heels are non tender and elevated off the bed using rolled towels and bone foam. Not using bone foam alot Intake/Output from previous day: 10/15 0701 - 10/16 0700 In: 8546 [P.O.:600; I.V.:2525; IV Piggyback:400] Out: 2670 [Urine:2400; Drains:220; Blood:50] Intake/Output this shift: No intake/output data recorded.   Recent Labs  09/08/17 0418  HGB 12.3    Recent Labs  09/08/17 0418  WBC 7.5  RBC 4.18  HCT 37.9  PLT 137*    Recent Labs  09/08/17 0418  NA 143  K 4.1  CL 111  CO2 28  BUN 12  CREATININE 0.79  GLUCOSE 150*  CALCIUM 8.8*   No results for input(s): LABPT, INR in the last 72 hours.  EXAM General - Patient is Alert, Appropriate and Oriented Extremity - Neurologically intact Neurovascular intact Sensation intact distally Intact pulses distally Dorsiflexion/Plantar flexion intact Compartment soft Dressing - dressing C/D/I Motor Function - intact, moving foot and toes well on exam.    Past Medical History:  Diagnosis Date  . Atrial fibrillation ([place])   . Breast cancer ([place]) 1990/1997    left mastectomy ,had recurrence in left suprclavic nodes several yrs ago-treated with radiation and chemo  . Colon polyp 2008  . Complication of anesthesia   . Diabetes mellitus without  complication ([facility])    history of, loss of 105 lbs  . Difficult intubation    expectorated blood 3-4 days after surgery in 2011  . Difficult intubation    hypoxia with endoscopy in 2016  . History of chemotherapy    5-FU/AC in 1990 under the care of Dr. Oliva Bustard. pt also tx with tamoxifen and arimidex  . History of radiation therapy    radiation treat at [facility]  . Hypertension 2008  . Personal history of chemotherapy 1990   BREAST CA  . Personal history of malignant neoplasm of breast 1990    left mastectomy   . Personal history of radiation therapy 1997   BREAST CA  . Sinusitis   . Sleep apnea    history of, went away after gastric surgery    Assessment/Plan: 1 Day Post-Op Procedure(s) (LRB): COMPUTER ASSISTED TOTAL KNEE ARTHROPLASTY (Left) Active Problems:   S/P total knee arthroplasty  Estimated body mass index is 39.06 kg/m as calculated from the following:   Height as of this encounter: 5\' 6"  (1.676 m).   Weight as of this encounter: 109.8 kg (242 lb). Advance diet Up with therapy D/C IV fluids Plan for discharge tomorrow Discharge home with home health  Labs: reviewed DVT Prophylaxis - Foot Pumps, TED hose and eliquis. which is regulated by pharmacy  Weight-Bearing as tolerated to left leg D/C O2 and Pulse OX and try on Room Air Labs in am Begin working on a bowel movement  Harley Mccartney R.  [place] Langston 09/08/2017, 7:40 AM

## 2017-09-08 NOTE — Care Management (Signed)
Rolling walker has been delivered by [place] [facility].

## 2017-09-08 NOTE — Evaluation (Signed)
Physical Therapy Evaluation Patient Details Name: Mandy Roberts MRN: [id number] DOB: 01-Nov-1961 Today's Date: 09/08/2017   History of Present Illness  Pt is a 56 y/o F s/p L TKA.  Pt's PMH includes breast cancer, a-fib.  Clinical Impression  Pt is s/p L TKA resulting in the deficits listed below (see PT Problem List). Mandy Roberts was excited to work with therapy today.  She ambulated 90 ft with some complaints of mild dizziness and diaphoresis which improved after returning to room and sitting for several minutes.  BP stable as documented below.  She requires min guard assist with transfers and ambulation.  She will have 24/7 assist/supervision available from her brother and house mate at d/c.  Pt will benefit from skilled PT to increase their independence and safety with mobility to allow discharge to the venue listed below.     Follow Up Recommendations Home health PT;Supervision for mobility/OOB    Equipment Recommendations  Rolling walker with 5" wheels    Recommendations for Other Services       Precautions / Restrictions Precautions Precautions: Fall;Knee Precaution Booklet Issued: No Precaution Comments: Instructed pt in no pillow under knee Required Braces or Orthoses: Knee Immobilizer - Left Knee Immobilizer - Left: Other (comment) (if unable to perform SLR) Restrictions Weight Bearing Restrictions: Yes LLE Weight Bearing: Weight bearing as tolerated      Mobility  Bed Mobility Overal bed mobility: Needs Assistance Bed Mobility: Supine to Sit     Supine to sit: Min guard     General bed mobility comments: Min cues for technique and sequencing.  Pt requires use of bed rail but no additional physical assist.  Increased time and effort.   Transfers Overall transfer level: Needs assistance   Transfers: Sit to/from Stand Sit to Stand: Min guard         General transfer comment: Cues for safe technique and hand placement.  Min guard for safety as pt with min  instability with sit>stand and poorly controlled descent to sit.   Ambulation/Gait Ambulation/Gait assistance: Min guard Ambulation Distance (Feet): 90 Feet Assistive device: Rolling walker (2 wheeled) Gait Pattern/deviations: Step-to pattern;Decreased stance time - left;Decreased step length - right;Decreased weight shift to left;Antalgic;Trunk flexed Gait velocity: decreased Gait velocity interpretation: Below normal speed for age/gender General Gait Details: Cues for upright posture and forward gaze as well as cues for sequencing using RW.  Pt become diaphoretic and mildly dizzy after ambulating 45 ft and was instructed to turn around and return to room.  Once sitting this improved and pt only nauseous.  Very minimal emesis.  Notified RN.  BP sitting after ambulating 135/69.   Stairs            Wheelchair Mobility    Modified Rankin (Stroke Patients Only)       Balance Overall balance assessment: Needs assistance Sitting-balance support: No upper extremity supported;Feet supported Sitting balance-Leahy Scale: Good     Standing balance support: During functional activity;Bilateral upper extremity supported Standing balance-Leahy Scale: Poor Standing balance comment: Pt relies on UE support for static and dynamic activities                             Pertinent Vitals/Pain Pain Assessment: No/denies pain    Home Living Family/patient expects to be discharged to:: Private residence Living Arrangements: Other relatives (brother and housemate) Available Help at Discharge: Family;Available 24 hours/day Type of Home: House Home Access: Stairs to enter  Entrance Stairs-Rails: Right Entrance Stairs-Number of Steps: 4 Home Layout: One level Home Equipment: Walker - 4 wheels;Wheelchair - manual;Cane - single point      Prior Function Level of Independence: Independent         Comments: Pt ambulates without AD and denies any falls in the past 6 months.  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 Independent with all ADLs.     Hand Dominance   Dominant Hand: Right    Extremity/Trunk Assessment   Upper Extremity Assessment Upper Extremity Assessment: Defer to OT evaluation    Lower Extremity Assessment Lower Extremity Assessment: LLE deficits/detail LLE Deficits / Details: Strength grossly at least 3/5 at hip and knee with functional observation.  Pt able to complete SLR x10.        Communication   Communication: No difficulties  Cognition Arousal/Alertness: Awake/alert Behavior During Therapy: WFL for tasks assessed/performed Overall Cognitive Status: Within Functional Limits for tasks assessed                                        General Comments General comments (skin integrity, edema, etc.): BP taken in supine at start of session: 129/63    Exercises Total Joint Exercises Ankle Circles/Pumps: AROM;Both;10 reps;Supine Quad Sets: Strengthening;Both;10 reps;Supine Straight Leg Raises: Strengthening;Left;10 reps;Supine Knee Flexion: AAROM;Left;5 reps;Seated;Other (comment) (with 5 second holds) Goniometric ROM: L knee flexion 97 deg with AAROM exercises in sitting   Assessment/Plan    PT Assessment Patient needs continued PT services  PT Problem List Decreased strength;Decreased range of motion;Decreased activity tolerance;Decreased balance;Decreased mobility;Decreased knowledge of use of DME;Decreased safety awareness;Decreased knowledge of precautions;Pain       PT Treatment Interventions DME instruction;Gait training;Stair training;Functional mobility training;Therapeutic activities;Therapeutic exercise;Balance training;Neuromuscular re-education;Patient/family education;Modalities    PT Goals (Current goals can be found in the Care Plan section)  Acute Rehab PT Goals Patient Stated Goal: to go home and get stronger PT Goal Formulation: With  patient Time For Goal Achievement: 09/22/17 Potential to Achieve Goals: Good    Frequency BID   Barriers to discharge        Co-evaluation               AM-PAC PT "6 Clicks" Daily Activity  Outcome Measure Difficulty turning over in bed (including adjusting bedclothes, sheets and blankets)?: Unable Difficulty moving from lying on back to sitting on the side of the bed? : Unable Difficulty sitting down on and standing up from a chair with arms (e.g., wheelchair, bedside commode, etc,.)?: A Lot Help needed moving to and from a bed to chair (including a wheelchair)?: A Little Help needed walking in hospital room?: A Little Help needed climbing 3-5 steps with a railing? : A Lot 6 Click Score: 12    End of Session Equipment Utilized During Treatment: Gait belt Activity Tolerance: Patient tolerated treatment well;Other (comment) (limited by nausea) Patient left: in chair;with call bell/phone within reach;with chair alarm set;Other (comment);with family/visitor present (polar care and bone foam in place) Nurse Communication: Mobility status;Other (comment) (pt nauseous with minimal emesis, requesting nausea meds) PT Visit Diagnosis: Pain;Unsteadiness on feet (R26.81);Other abnormalities of gait and mobility (R26.89);Muscle weakness (generalized) (M62.81) Pain - Right/Left: Left Pain - part of body: Knee    Time: 1010-1048 PT Time Calculation (min) (ACUTE ONLY): 38 min   Charges:   PT  Evaluation $PT Eval Low Complexity: 1 Low PT Treatments $Gait Training: 8-22 mins $Therapeutic Exercise: 8-22 mins   PT G Codes:   PT G-Codes **NOT FOR INPATIENT CLASS** Functional Assessment Tool Used: AM-PAC 6 Clicks Basic Mobility;Clinical judgement Functional Limitation: Mobility: Walking and moving around Mobility: Walking and Moving Around Current Status (X5284): At least 60 percent but less than 80 percent impaired, limited or restricted Mobility: Walking and Moving Around Goal Status  [phone]): At least 20 percent but less than 40 percent impaired, limited or restricted    Collie Siad PT, DPT 09/08/2017, 12:14 PM

## 2017-09-08 NOTE — Care Management Note (Signed)
Case Management Note  Patient Details  Name: Mandy Roberts MRN: [id number] Date of Birth: [birth year]  Subjective/Objective:                  Met with patient and her female friend to discuss discharge planning. Patient would like for me to get a rolling walker. She would like to use [facility] for home health services. She would like to return home. She is currently having issues with nausea.  Action/Plan: Referral to [facility] for rolling walker. I have notified Tim with [facility] of request for home health.   Expected Discharge Date:                  Expected Discharge Plan:     In-House Referral:     Discharge planning Services  CM Consult  Post Acute Care Choice:  Durable Medical Equipment, Home Health Choice offered to:  Patient  DME Arranged:  Walker rolling DME Agency:  [place]:  PT [place]:  [facility] (now [facility])  Status of Service:  In process, will continue to follow  If discussed at Long Length of Stay Meetings, dates discussed:    Additional Comments:  Marshell Garfinkel, RN 09/08/2017, 1:32 PM

## 2017-09-08 NOTE — Progress Notes (Signed)
Pain management provided to patient, able to get pain under control, resting comfortably after midnight. Patient dangled on bedside at beginning of shift, tolerated well. Unable to tolerate bone foam. Patient wanted towel roll removed d/t pain, educated that the towel roll must remain in place if unable to tolerate bone foam, and pain medication administered. IS given and encouraged. Family at bedside, very involved in patient care and encouragement of patient.

## 2017-09-08 NOTE — Anesthesia Postprocedure Evaluation (Signed)
Anesthesia Post Note  Patient: [place]  Procedure(s) Performed: COMPUTER ASSISTED TOTAL KNEE ARTHROPLASTY (Left Knee)  Patient location during evaluation: Other Anesthesia Type: Spinal Level of consciousness: oriented and awake and alert Pain management: pain level controlled Vital Signs Assessment: post-procedure vital signs reviewed and stable Respiratory status: spontaneous breathing, respiratory function stable and patient connected to nasal cannula oxygen Cardiovascular status: blood pressure returned to baseline and stable Postop Assessment: no headache, no backache and no apparent nausea or vomiting Anesthetic complications: no     Last Vitals:  Vitals:   09/08/17 0053 09/08/17 0524  BP: (!) 100/45 (!) 110/45  Pulse: (!) 51 (!) 50  Resp:  16  Temp: (!) 36.4 C 36.7 C  SpO2: 95% 97%    Last Pain:  Vitals:   09/08/17 0555  TempSrc:   PainSc: Asleep                 Alison Stalling

## 2017-09-08 NOTE — Progress Notes (Signed)
Patient complaints of nausea and denies  vomiting despite medical and non medical interventions taking with no relief. Dr Marry Guan notified and orders placed.

## 2017-09-09 LAB — BASIC METABOLIC PANEL
ANION GAP: 7 (ref 5–15)
BUN: 15 mg/dL (ref 6–20)
CHLORIDE: 109 mmol/L (ref 101–111)
CO2: 27 mmol/L (ref 22–32)
Calcium: 8.6 mg/dL — ABNORMAL LOW (ref 8.9–10.3)
Creatinine, Ser: 0.75 mg/dL (ref 0.44–1.00)
GFR calc Af Amer: >60 mL/min (ref >60)
GLUCOSE: 103 mg/dL — AB (ref 65–99)
POTASSIUM: 3.6 mmol/L (ref 3.5–5.1)
Sodium: 143 mmol/L (ref 135–145)

## 2017-09-09 LAB — CBC
HEMATOCRIT: 34.6 % — AB (ref 35.0–47.0)
HEMOGLOBIN: 11.4 g/dL — AB (ref 12.0–16.0)
MCH: 30 pg (ref 26.0–34.0)
MCHC: 33.1 g/dL (ref 32.0–36.0)
MCV: 90.5 fL (ref 80.0–100.0)
Platelets: 113 10*3/uL — ABNORMAL LOW (ref 150–440)
RBC: 3.82 MIL/uL (ref 3.80–5.20)
RDW: 13.8 % (ref 11.5–14.5)
WBC: 6.4 10*3/uL (ref 3.6–11.0)

## 2017-09-09 MED ORDER — LACTULOSE 10 GM/15ML PO SOLN
10.0000 g | Freq: Two times a day (BID) | ORAL | Status: DC | PRN
  Administered 2017-09-09: 10 g via ORAL
  Filled 2017-09-09: qty 30

## 2017-09-09 NOTE — Progress Notes (Signed)
Subjective: 2 Days Post-Op Procedure(s) (LRB): COMPUTER ASSISTED TOTAL KNEE ARTHROPLASTY (Left) Patient reports pain as mild.   Patient is well, and has had no acute complaints or problems Did very well with therapy yesterday. Plan is to go Home after hospital stay. no nausea and no vomiting Patient denies any chest pains or shortness of breath. Objective: Vital signs in last 24 hours: Temp:  [97.7 F (36.5 C)-97.8 F (36.6 C)] 97.8 F (36.6 C) (10/16 2012) Pulse Rate:  [49-53] 53 (10/16 2012) Resp:  [16] 16 (10/16 1711) BP: (106-108)/(45-57) 108/47 (10/16 2012) SpO2:  [94 %-100 %] 100 % (10/16 2012) well approximated incision Heels are non tender and elevated off the bed using rolled towels Intake/Output from previous day: 10/16 0701 - 10/17 0700 In: 2538.3 [P.O.:720; I.V.:1818.3] Out: 210 [Drains:210] Intake/Output this shift: No intake/output data recorded.   Recent Labs  09/08/17 0418 09/09/17 0421  HGB 12.3 11.4*    Recent Labs  09/08/17 0418 09/09/17 0421  WBC 7.5 6.4  RBC 4.18 3.82  HCT 37.9 34.6*  PLT 137* 113*    Recent Labs  09/08/17 0418 09/09/17 0421  NA 143 143  K 4.1 3.6  CL 111 109  CO2 28 27  BUN 12 15  CREATININE 0.79 0.75  GLUCOSE 150* 103*  CALCIUM 8.8* 8.6*   No results for input(s): LABPT, INR in the last 72 hours.  EXAM General - Patient is Alert, Appropriate and Oriented Extremity - Neurologically intact Neurovascular intact Sensation intact distally Intact pulses distally Dorsiflexion/Plantar flexion intact No cellulitis present Compartment soft Dressing - dressing C/D/I Motor Function - intact, moving foot and toes well on exam.    Past Medical History:  Diagnosis Date  . Atrial fibrillation ([place])   . Breast cancer ([place]) 1990/1997    left mastectomy ,had recurrence in left suprclavic nodes several yrs ago-treated with radiation and chemo  . Colon polyp 2008  . Complication of anesthesia   . Diabetes mellitus  without complication ([facility])    history of, loss of 105 lbs  . Difficult intubation    expectorated blood 3-4 days after surgery in 2011  . Difficult intubation    hypoxia with endoscopy in 2016  . History of chemotherapy    5-FU/AC in 1990 under the care of Dr. Oliva Bustard. pt also tx with tamoxifen and arimidex  . History of radiation therapy    radiation treat at [facility]  . Hypertension 2008  . Personal history of chemotherapy 1990   BREAST CA  . Personal history of malignant neoplasm of breast 1990    left mastectomy   . Personal history of radiation therapy 1997   BREAST CA  . Sinusitis   . Sleep apnea    history of, went away after gastric surgery    Assessment/Plan: 2 Days Post-Op Procedure(s) (LRB): COMPUTER ASSISTED TOTAL KNEE ARTHROPLASTY (Left) Active Problems:   S/P total knee arthroplasty  Estimated body mass index is 39.06 kg/m as calculated from the following:   Height as of this encounter: 5\' 6"  (1.676 m).   Weight as of this encounter: 109.8 kg (242 lb). Up with therapy Discharge home with home health  Labs: reviewed DVT Prophylaxis - Foot Pumps, TED hose and eliquis Weight-Bearing as tolerated to left leg Please wash operative leg and apply TED stocking to both legs Pt may be discharged to home once she has a bowel movement . Please give the pt 2 extra honeycomb dressing to take home  [place]. Rogers Blocker  PA [place] 09/09/2017, 7:04 AM

## 2017-09-09 NOTE — Progress Notes (Signed)
OT Cancellation Note  Patient Details Name: [name] MRN: [id number] DOB: 1961/02/23   Cancelled Treatment:    Reason Eval/Treat Not Completed: Patient declined, no reason specified. Upon attempt, pt eager to return home today. Politely declines OT at this time, states "I'm good to go." Will sign off as no additional skilled OT needs at this time.  Jeni Salles, MPH, MS, OTR/L ascom [phone] 09/09/17, 10:21 AM

## 2017-09-09 NOTE — Care Management (Signed)
RNCM spoke with patient and she "is ready to go home"; waiting to have a bm.  I have notified [facility] of patient discharge. I confirmed with patient that she is on Eliquis as a blood thinner and also received her rolling walker.  No other RNCM needs.

## 2017-09-09 NOTE — Progress Notes (Signed)
Physical Therapy Treatment Patient Details Name: Mandy Roberts MRN: [id number] DOB: Feb 22, 1961 Today's Date: 09/09/2017    History of Present Illness Pt is a 56 y/o F s/p L TKA.  Pt's PMH includes breast cancer, a-fib.    PT Comments    Pt made excellent progress with mobility and completed stair training with supervision only for safety.  She demonstrates a step through gait pattern while ambulating with supervision for safety.  Follow-up recommendations remain appropriate.     Follow Up Recommendations  Home health PT;Supervision for mobility/OOB     Equipment Recommendations  Rolling walker with 5" wheels    Recommendations for Other Services       Precautions / Restrictions Precautions Precautions: Fall;Knee Precaution Booklet Issued: No Precaution Comments: Pt able to recall no pillow under knee Required Braces or Orthoses: Knee Immobilizer - Left Knee Immobilizer - Left: Other (comment) (if unable to perform SLR) Restrictions Weight Bearing Restrictions: Yes LLE Weight Bearing: Weight bearing as tolerated    Mobility  Bed Mobility Overal bed mobility: Modified Independent Bed Mobility: Supine to Sit     Supine to sit: Modified independent (Device/Increase time)     General bed mobility comments: No cues or physical assist required.  Pt performs safely and with proper technique.   Transfers Overall transfer level: Needs assistance Equipment used: Rolling walker (2 wheeled) Transfers: Sit to/from Stand Sit to Stand: Supervision         General transfer comment: Pt performs safe and proper technique with sit<>stand without cues or physical assist.  Supervision provided for safety.   Ambulation/Gait Ambulation/Gait assistance: Supervision Ambulation Distance (Feet): 215 Feet Assistive device: Rolling walker (2 wheeled) Gait Pattern/deviations: Decreased stance time - left;Decreased step length - right;Decreased weight shift to left;Antalgic;Trunk  flexed;Step-through pattern Gait velocity: decreased Gait velocity interpretation: Below normal speed for age/gender General Gait Details: Cues to relax shoulders and for upright posture and forward gaze and for step through gait pattern which pt is able to maintain throughout.     Stairs Stairs: Yes   Stair Management: One rail Right;Step to pattern;Forwards Number of Stairs: 4 (x2) General stair comments: Cues for sequencing and safe technique.  Pt peformed x2 with improved stability on second trial, only requiring supervision for safety.    Wheelchair Mobility    Modified Rankin (Stroke Patients Only)       Balance Overall balance assessment: Needs assistance Sitting-balance support: No upper extremity supported;Feet supported Sitting balance-Leahy Scale: Good     Standing balance support: During functional activity;Bilateral upper extremity supported Standing balance-Leahy Scale: Fair Standing balance comment: Pt able to stand statically without UE support but relies on UE support for dynamic activities.                            Cognition Arousal/Alertness: Awake/alert Behavior During Therapy: WFL for tasks assessed/performed Overall Cognitive Status: Within Functional Limits for tasks assessed                                        Exercises Total Joint Exercises Hip ABduction/ADduction: Strengthening;Left;10 reps;Seated Straight Leg Raises: Strengthening;Left;10 reps;Seated Long Arc Quad: Strengthening;Left;10 reps;Seated Knee Flexion: AAROM;Left;Seated;Other (comment);10 reps (with 5 second holds) Goniometric ROM: L knee flexion 96 deg with AAROM exercises in sitting    General Comments General comments (skin integrity, edema, etc.): Discussed safe technique for  car transfer and pt verbalized understanding      Pertinent Vitals/Pain Pain Assessment: 0-10 Pain Score: 3  Pain Location: L knee Pain Descriptors / Indicators:  Aching Pain Intervention(s): Limited activity within patient's tolerance;Monitored during session;Patient requesting pain meds-RN notified;Other (comment) (polar care applied at end of session)    Home Living                      Prior Function            PT Goals (current goals can now be found in the care plan section) Acute Rehab PT Goals Patient Stated Goal: to go home and get stronger PT Goal Formulation: With patient Time For Goal Achievement: 09/22/17 Potential to Achieve Goals: Good Progress towards PT goals: Progressing toward goals    Frequency    BID      PT Plan Current plan remains appropriate    Co-evaluation              AM-PAC PT "6 Clicks" Daily Activity  Outcome Measure  Difficulty turning over in bed (including adjusting bedclothes, sheets and blankets)?: None Difficulty moving from lying on back to sitting on the side of the bed? : None Difficulty sitting down on and standing up from a chair with arms (e.g., wheelchair, bedside commode, etc,.)?: A Little Help needed moving to and from a bed to chair (including a wheelchair)?: A Little Help needed walking in hospital room?: A Little Help needed climbing 3-5 steps with a railing? : A Little 6 Click Score: 20    End of Session Equipment Utilized During Treatment: Gait belt Activity Tolerance: Patient tolerated treatment well Patient left: in chair;with call bell/phone within reach;with chair alarm set;Other (comment);with SCD's reapplied (polar care and bone foam in place) Nurse Communication: Mobility status PT Visit Diagnosis: Pain;Unsteadiness on feet (R26.81);Other abnormalities of gait and mobility (R26.89);Muscle weakness (generalized) (M62.81) Pain - Right/Left: Left Pain - part of body: Knee     Time: 3559-7416 PT Time Calculation (min) (ACUTE ONLY): 24 min  Charges:  $Gait Training: 8-22 mins $Therapeutic Exercise: 8-22 mins                    G Codes:       Collie Siad PT, DPT 09/09/2017, 10:57 AM

## 2017-09-10 ENCOUNTER — Encounter: Payer: Self-pay | Admitting: *Deleted

## 2017-09-10 ENCOUNTER — Other Ambulatory Visit: Payer: Self-pay | Admitting: *Deleted

## 2017-09-10 DIAGNOSIS — Z7901 Long term (current) use of anticoagulants: Secondary | ICD-10-CM | POA: Diagnosis not present

## 2017-09-10 DIAGNOSIS — Z853 Personal history of malignant neoplasm of breast: Secondary | ICD-10-CM | POA: Diagnosis not present

## 2017-09-10 DIAGNOSIS — I4891 Unspecified atrial fibrillation: Secondary | ICD-10-CM | POA: Diagnosis not present

## 2017-09-10 DIAGNOSIS — Z9181 History of falling: Secondary | ICD-10-CM | POA: Diagnosis not present

## 2017-09-10 DIAGNOSIS — E119 Type 2 diabetes mellitus without complications: Secondary | ICD-10-CM | POA: Diagnosis not present

## 2017-09-10 DIAGNOSIS — Z86010 Personal history of colonic polyps: Secondary | ICD-10-CM | POA: Diagnosis not present

## 2017-09-10 DIAGNOSIS — Z471 Aftercare following joint replacement surgery: Secondary | ICD-10-CM | POA: Diagnosis not present

## 2017-09-10 DIAGNOSIS — I10 Essential (primary) hypertension: Secondary | ICD-10-CM | POA: Diagnosis not present

## 2017-09-10 DIAGNOSIS — G4733 Obstructive sleep apnea (adult) (pediatric): Secondary | ICD-10-CM | POA: Diagnosis not present

## 2017-09-10 NOTE — Patient Outreach (Signed)
[facility]) Care Management  09/10/2017  Mandy Roberts [birth year] [id number]   Subjective: Telephone call to patient's home number, spoke with patient, and HIPAA verified.  Discussed [facility] Care Management [facility] Transition of care follow up, patient voiced understanding, and is in agreement to follow up.   Patient states she is doing well, knee is sore, medication working well when she takes them, and has a follow up appointment with surgeon on 09/22/17. Discussed pain management strategies, patient voiced understanding, and states she will take medication before pain gets too bad. Patient voices understanding of medical diagnosis, surgery,  and treatment plan.  States she is accessing the following [facility] benefits: outpatient pharmacy, hospital indemnity, short term disability, [employer] program, and family medical leave act (FMLA) in place. Patient states she does not have any education material, transition of care, care coordination, disease management, disease monitoring, transportation, community resource, or pharmacy needs at this time. States she is very appreciative of the follow up and is in agreement to receive [place] Management information.     Objective: Per chart review, patient hospitalized 09/07/17 -09/09/17 for primary osteoarthritis of left knee.  Status post Left total knee arthroplasty using computer-assisted navigation on 09/07/17.   Patient also has a history of diabetes (pre diabetes per patient), hypertension, Atrial fibrillation, and breast cancer.     Assessment: Received [facility] Preoperative / Transition of care referral on 08/27/17.  Transition of care follow up completed, no care management needs, and will proceed with case closure.     Plan: RNCM will send patient successful outreach letter, [facility] pamphlet, and magnet. RNCM will send case closure due to follow up completed / no care management needs request to [facility] [place]  Management.    Charlynn Salih H. Annia Friendly, BSN, [place] Management [facility] Telephonic CM Phone: [phone] Fax: [phone]

## 2017-09-14 DIAGNOSIS — E119 Type 2 diabetes mellitus without complications: Secondary | ICD-10-CM | POA: Diagnosis not present

## 2017-09-14 DIAGNOSIS — Z86010 Personal history of colonic polyps: Secondary | ICD-10-CM | POA: Diagnosis not present

## 2017-09-14 DIAGNOSIS — I4891 Unspecified atrial fibrillation: Secondary | ICD-10-CM | POA: Diagnosis not present

## 2017-09-14 DIAGNOSIS — Z471 Aftercare following joint replacement surgery: Secondary | ICD-10-CM | POA: Diagnosis not present

## 2017-09-14 DIAGNOSIS — Z9181 History of falling: Secondary | ICD-10-CM | POA: Diagnosis not present

## 2017-09-14 DIAGNOSIS — G4733 Obstructive sleep apnea (adult) (pediatric): Secondary | ICD-10-CM | POA: Diagnosis not present

## 2017-09-14 DIAGNOSIS — Z7901 Long term (current) use of anticoagulants: Secondary | ICD-10-CM | POA: Diagnosis not present

## 2017-09-14 DIAGNOSIS — I10 Essential (primary) hypertension: Secondary | ICD-10-CM | POA: Diagnosis not present

## 2017-09-14 DIAGNOSIS — Z853 Personal history of malignant neoplasm of breast: Secondary | ICD-10-CM | POA: Diagnosis not present

## 2017-09-16 DIAGNOSIS — E119 Type 2 diabetes mellitus without complications: Secondary | ICD-10-CM | POA: Diagnosis not present

## 2017-09-16 DIAGNOSIS — G4733 Obstructive sleep apnea (adult) (pediatric): Secondary | ICD-10-CM | POA: Diagnosis not present

## 2017-09-16 DIAGNOSIS — I10 Essential (primary) hypertension: Secondary | ICD-10-CM | POA: Diagnosis not present

## 2017-09-16 DIAGNOSIS — Z471 Aftercare following joint replacement surgery: Secondary | ICD-10-CM | POA: Diagnosis not present

## 2017-09-16 DIAGNOSIS — Z86010 Personal history of colonic polyps: Secondary | ICD-10-CM | POA: Diagnosis not present

## 2017-09-16 DIAGNOSIS — Z9181 History of falling: Secondary | ICD-10-CM | POA: Diagnosis not present

## 2017-09-16 DIAGNOSIS — Z7901 Long term (current) use of anticoagulants: Secondary | ICD-10-CM | POA: Diagnosis not present

## 2017-09-16 DIAGNOSIS — Z853 Personal history of malignant neoplasm of breast: Secondary | ICD-10-CM | POA: Diagnosis not present

## 2017-09-16 DIAGNOSIS — I4891 Unspecified atrial fibrillation: Secondary | ICD-10-CM | POA: Diagnosis not present

## 2017-09-18 DIAGNOSIS — E119 Type 2 diabetes mellitus without complications: Secondary | ICD-10-CM | POA: Diagnosis not present

## 2017-09-18 DIAGNOSIS — Z9181 History of falling: Secondary | ICD-10-CM | POA: Diagnosis not present

## 2017-09-18 DIAGNOSIS — Z86010 Personal history of colonic polyps: Secondary | ICD-10-CM | POA: Diagnosis not present

## 2017-09-18 DIAGNOSIS — I10 Essential (primary) hypertension: Secondary | ICD-10-CM | POA: Diagnosis not present

## 2017-09-18 DIAGNOSIS — Z853 Personal history of malignant neoplasm of breast: Secondary | ICD-10-CM | POA: Diagnosis not present

## 2017-09-18 DIAGNOSIS — Z471 Aftercare following joint replacement surgery: Secondary | ICD-10-CM | POA: Diagnosis not present

## 2017-09-18 DIAGNOSIS — Z7901 Long term (current) use of anticoagulants: Secondary | ICD-10-CM | POA: Diagnosis not present

## 2017-09-18 DIAGNOSIS — I4891 Unspecified atrial fibrillation: Secondary | ICD-10-CM | POA: Diagnosis not present

## 2017-09-18 DIAGNOSIS — G4733 Obstructive sleep apnea (adult) (pediatric): Secondary | ICD-10-CM | POA: Diagnosis not present

## 2017-09-21 DIAGNOSIS — G4733 Obstructive sleep apnea (adult) (pediatric): Secondary | ICD-10-CM | POA: Diagnosis not present

## 2017-09-21 DIAGNOSIS — I4891 Unspecified atrial fibrillation: Secondary | ICD-10-CM | POA: Diagnosis not present

## 2017-09-21 DIAGNOSIS — Z86010 Personal history of colonic polyps: Secondary | ICD-10-CM | POA: Diagnosis not present

## 2017-09-21 DIAGNOSIS — Z471 Aftercare following joint replacement surgery: Secondary | ICD-10-CM | POA: Diagnosis not present

## 2017-09-21 DIAGNOSIS — E119 Type 2 diabetes mellitus without complications: Secondary | ICD-10-CM | POA: Diagnosis not present

## 2017-09-21 DIAGNOSIS — I10 Essential (primary) hypertension: Secondary | ICD-10-CM | POA: Diagnosis not present

## 2017-09-21 DIAGNOSIS — Z853 Personal history of malignant neoplasm of breast: Secondary | ICD-10-CM | POA: Diagnosis not present

## 2017-09-21 DIAGNOSIS — Z7901 Long term (current) use of anticoagulants: Secondary | ICD-10-CM | POA: Diagnosis not present

## 2017-09-21 DIAGNOSIS — Z9181 History of falling: Secondary | ICD-10-CM | POA: Diagnosis not present

## 2017-09-23 ENCOUNTER — Encounter: Payer: Self-pay | Admitting: Physical Therapy

## 2017-09-23 ENCOUNTER — Ambulatory Visit: Payer: 59 | Attending: Physician Assistant | Admitting: Physical Therapy

## 2017-09-23 DIAGNOSIS — M25662 Stiffness of left knee, not elsewhere classified: Secondary | ICD-10-CM | POA: Diagnosis not present

## 2017-09-23 DIAGNOSIS — M6281 Muscle weakness (generalized): Secondary | ICD-10-CM | POA: Insufficient documentation

## 2017-09-23 DIAGNOSIS — M25562 Pain in left knee: Secondary | ICD-10-CM | POA: Insufficient documentation

## 2017-09-23 DIAGNOSIS — R262 Difficulty in walking, not elsewhere classified: Secondary | ICD-10-CM | POA: Insufficient documentation

## 2017-09-23 NOTE — Therapy (Signed)
[facility] [address] [place], [address] Phone: [phone]   Fax:  [phone]  Physical Therapy Evaluation  Patient Details  Name: Mandy Roberts MRN: [id number] Date of Birth: [birth year] Referring Provider: Vance Peper PA; Dereck Leep MD  Encounter Date: 09/23/2017      PT End of Session - 09/23/17 1521    Visit Number 1   Number of Visits 16   Date for PT Re-Evaluation 11/18/17   PT Start Time 1696   PT Stop Time 1515   PT Time Calculation (min) 50 min   Activity Tolerance Patient tolerated treatment well   Behavior During Therapy [facility] for tasks assessed/performed      Past Medical History:  Diagnosis Date  . Atrial fibrillation ([place])   . Breast cancer ([place]) 1990/1997    left mastectomy ,had recurrence in left suprclavic nodes several yrs ago-treated with radiation and chemo  . Colon polyp 2008  . Complication of anesthesia   . Diabetes mellitus without complication (HCC)    history of, loss of 105 lbs  . Difficult intubation    expectorated blood 3-4 days after surgery in 2011  . Difficult intubation    hypoxia with endoscopy in 2016  . History of chemotherapy    5-FU/AC in 1990 under the care of Dr. Oliva Bustard. pt also tx with tamoxifen and arimidex  . History of radiation therapy    radiation treat at [facility]  . Hypertension 2008  . Personal history of chemotherapy 1990   BREAST CA  . Personal history of malignant neoplasm of breast 1990    left mastectomy   . Personal history of radiation therapy 1997   BREAST CA  . Sinusitis   . Sleep apnea    history of, went away after gastric surgery    Past Surgical History:  Procedure Laterality Date  . ABDOMINAL HYSTERECTOMY  1996  . COLONOSCOPY  2008   Dr. Jamal Collin  . DILATION AND CURETTAGE OF UTERUS    . KNEE ARTHROPLASTY Left 09/07/2017   Procedure: COMPUTER ASSISTED TOTAL KNEE ARTHROPLASTY;  Surgeon: Dereck Leep, MD;  Location:  [facility] ORS;  Service: Orthopedics;  Laterality: Left;  . KNEE ARTHROSCOPY Bilateral   . KNEE SURGERY Left 2009, 2010, 2012  . LAPAROSCOPIC GASTRIC RESTRICTIVE DUODENAL PROCEDURE (DUODENAL SWITCH) N/A 04/17/2015   Procedure: LAPAROSCOPIC GASTRIC RESTRICTIVE DUODENAL PROCEDURE, single anastamosis ;  Surgeon: Bonner Puna, MD;  Location: [facility] ORS;  Service: General;  Laterality: N/A;  . MASTECTOMY Left 1990   BREAST CA  . TONSILLECTOMY    . UPPER GI ENDOSCOPY N/A 04/17/2015   Procedure: UPPER GI ENDOSCOPY;  Surgeon: Bonner Puna, MD;  Location: [facility] ORS;  Service: General;  Laterality: N/A;    There were no vitals filed for this visit.       Subjective Assessment - 09/23/17 1436    Subjective Patient reports tightness, fullness and pulling sensation in left knee.    Pertinent History Patient reports she has had pain and multiple surgeries left knee since 2009; initial sx for partial meniscectomy, then debridement, and then 2012 another arthroscopy. Then underwent TKA 09/07/17 followed by home health PT x 2 weeks and is now referred to out patient physical therapy.    Limitations Sitting;Standing;Walking;House hold activities;Other (comment)  sleeping   Patient Stated Goals improve ROM, stregth and mobility with walking without AD   Currently in Pain? Yes   Pain Score 2  Pain Location Knee   Pain Orientation Left   Pain Descriptors / Indicators Aching;Tightness  pulling   Pain Type Surgical pain  09/07/2017   Pain Onset 1 to 4 weeks ago   Pain Frequency Intermittent   Aggravating Factors  sitting, standing, walking, sleeping   Pain Relieving Factors ice, medication   Effect of Pain on Daily Activities limits ability to stand, walk and perform daily activties            [facility] PT Assessment - 09/23/17 1445      Assessment   Medical Diagnosis S/P total knee replacement using cement, left   Referring Provider Vance Peper PA; Hooten, Valinda Party. MD   Onset Date/Surgical Date 09/07/17    Hand Dominance Right   Next MD Visit unknown   Prior Therapy prior therapy Home Health x 2 weeks      Precautions   Precautions None     Restrictions   Weight Bearing Restrictions No     Balance Screen   Has the patient fallen in the past 6 months No     [place] residence   Living Arrangements Other relatives   Home Access Stairs to enter   Entrance Stairs-Number of Steps 7 front, 4 back entrance   [place] One level   [place] - standard;Cane - single point;Wheelchair - Education administrator (comment)  rollator     Prior Function   Level of Independence Independent   Vocation Full time employment   Vocation Requirements sitting, computer, manage ED, standing, walking   Leisure play bingo, family, travel     Cognition   Overall Cognitive Status Within Functional Limits for tasks assessed     Observation/Other Assessments   Lower Extremity Functional Scale  32/80 (80 = no self perceived disability)     Sensation   Light Touch Impaired by gross assessment  tingling into great toe (all post sx)     ROM / Strength   AROM / PROM / Strength AROM;Strength     AROM   Overall AROM Comments left knee 0-110 degrees flexion     Strength   Overall Strength Comments decreased left LE hip flexion 3+/5, knee extension 3-/5, flexion 3+/5, hip ER 3/5, abduction 3/5     Palpation   Palpation comment increased warmth left knee with moderate swelling            Objective measurements completed on examination: See above findings.                 PT Education - 09/23/17 1539    Education provided Yes   Education Details home instruction: ROM every hour, walk 2-3x/day using appropriate AD, elevate 30 min 2x/day, ice as needed for pain and inflammation   Person(s) Educated Patient   Methods Explanation;Demonstration;Verbal cues;Handout   Comprehension Verbalized  understanding;Returned demonstration;Verbal cues required             PT Long Term Goals - 09/23/17 1550      PT LONG TERM GOAL #1   Title Patient will demonstrate improved function with daily tasks involving left LE with LEFS score of 40/80   Baseline LEFS 32/80   Status New   Target Date 10/21/17     PT LONG TERM GOAL #2   Title Patient will demonstrate improved function with daily tasks involving left LE with LEFS score of 50/80   Baseline LEFS 32/80   Status New  Target Date 11/18/17     PT LONG TERM GOAL #3   Title  Patient will be independent with ambulation with least restrictive AD on level surfaces and stairs by to demonstrate improved function for household and community ambulation   Baseline requires walker or SPC for ambulation safely   Status New   Target Date 10/21/17     PT LONG TERM GOAL #4   Title  Patient will demonstrate improved 10MW to 30m/s or better to demonstrate improved community ambulation   Baseline 10MW 20 seconds (0.44m/s)   Status New   Target Date 11/04/17     PT LONG TERM GOAL #5   Title Patient will be independent with home program for flexibility, strengthening and pain control to allow transition to self management once discharged from physical therapy   Baseline requires moderate assistance, instruction for appropriate exercises and pain control strategies   Status New   Target Date 11/18/17                Plan - 09/23/17 1523    Clinical Impression Statement Patient is a 56 year old female s/p left TKA 09/07/2017. She presents with limitations of strength in left LE hip, knee and decreased ROM with moderate swelling and incresaed warmth in left knee. Her LEFS score of 32/80 indicates moderate self perceived disability. She has limited knowledge of appropriate pain control strategies and exercise progression in order to return to full function and will benefit from physical therapy intervention.   History and Personal Factors  relevant to plan of care: Patient reports she has had pain and multiple surgeries left knee since 2009; initial sx for partial meniscectomy, then debridement, and then 2012 another arthroscopy. Then underwent TKA 09/07/17 followed by home health PT x 2 weeks and is now referred to out patient physical therapy.    Clinical Presentation Evolving   Clinical Presentation due to: recent surgery   Clinical Decision Making Moderate   Rehab Potential Good   Clinical Impairments Affecting Rehab Potential (+)motivated, acute condition (recent surgery), age(-)multiple left knee surgeries, history of CA, arthritis   PT Frequency 2x / week   PT Duration 8 weeks   PT Treatment/Interventions Electrical Stimulation;Cryotherapy;Moist Heat;Gait training;Patient/family education;Neuromuscular re-education;Therapeutic exercise;Balance training;Manual techniques;Scar mobilization   PT Next Visit Plan modailities for neuromuscular re education, progressive exercise for ROM, strength left LE   PT Home Exercise Plan ROM every hour, elevate for swelling control, use of ice for pain, inflammation   Consulted and Agree with Plan of Care Patient      Patient will benefit from skilled therapeutic intervention in order to improve the following deficits and impairments:  Decreased strength, Decreased activity tolerance, Pain, Decreased endurance, Decreased range of motion, Difficulty walking, Impaired perceived functional ability  Visit Diagnosis: Acute pain of left knee - Plan: PT plan of care cert/re-cert  Stiffness of left knee, not elsewhere classified - Plan: PT plan of care cert/re-cert  Muscle weakness (generalized) - Plan: PT plan of care cert/re-cert  Difficulty in walking, not elsewhere classified - Plan: PT plan of care cert/re-cert     Problem List Patient Active Problem List   Diagnosis Date Noted  . 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 Difficult airway for intubation 04/09/2015  . Chronic nasal  congestion 04/09/2015  . Atrial fibrillation ([place]) 09/16/2013  . Obstructive sleep apnea 09/16/2013  . Morbid obesity ([place]) 09/16/2013  . Personal history of breast cancer 02/09/2013  . Hypertension   . Cancer ([place])  Jomarie Longs PT 09/23/2017, 4:09 PM  [facility] [address] [place], [address] Phone: [phone]   Fax:  [phone]  Name: Mandy Roberts MRN: [id number] Date of Birth: 12-30-1960

## 2017-09-28 ENCOUNTER — Ambulatory Visit: Payer: 59 | Attending: Physician Assistant | Admitting: Physical Therapy

## 2017-09-28 ENCOUNTER — Encounter: Payer: Self-pay | Admitting: Physical Therapy

## 2017-09-28 DIAGNOSIS — M25662 Stiffness of left knee, not elsewhere classified: Secondary | ICD-10-CM | POA: Insufficient documentation

## 2017-09-28 DIAGNOSIS — M6281 Muscle weakness (generalized): Secondary | ICD-10-CM | POA: Insufficient documentation

## 2017-09-28 DIAGNOSIS — M25562 Pain in left knee: Secondary | ICD-10-CM | POA: Diagnosis not present

## 2017-09-28 DIAGNOSIS — R262 Difficulty in walking, not elsewhere classified: Secondary | ICD-10-CM | POA: Diagnosis not present

## 2017-09-29 NOTE — Therapy (Signed)
[facility] [address] [place], [address] Phone: [phone]   Fax:  [phone]  Physical Therapy Treatment  Patient Details  Name: Mandy Roberts MRN: [id number] Date of Birth: [birth year] Referring Provider: Vance Peper PA; Dereck Leep MD   Encounter Date: 09/28/2017  PT End of Session - 09/28/17 1645    Visit Number  2    Number of Visits  16    Date for PT Re-Evaluation  11/18/17    PT Start Time  1604    PT Stop Time  1638    PT Time Calculation (min)  34 min    Activity Tolerance  Patient tolerated treatment well    Behavior During Therapy  WFL for tasks assessed/performed       Past Medical History:  Diagnosis Date  . Atrial fibrillation ([place])   . Breast cancer ([place]) 1990/1997    left mastectomy ,had recurrence in left suprclavic nodes several yrs ago-treated with radiation and chemo  . Colon polyp 2008  . Complication of anesthesia   . Diabetes mellitus without complication (HCC)    history of, loss of 105 lbs  . Difficult intubation    expectorated blood 3-4 days after surgery in 2011  . Difficult intubation    hypoxia with endoscopy in 2016  . History of chemotherapy    5-FU/AC in 1990 under the care of Dr. Oliva Bustard. pt also tx with tamoxifen and arimidex  . History of radiation therapy    radiation treat at [facility]  . Hypertension 2008  . Personal history of chemotherapy 1990   BREAST CA  . Personal history of malignant neoplasm of breast 1990    left mastectomy   . Personal history of radiation therapy 1997   BREAST CA  . Sinusitis   . Sleep apnea    history of, went away after gastric surgery    Past Surgical History:  Procedure Laterality Date  . ABDOMINAL HYSTERECTOMY  1996  . COLONOSCOPY  2008   Dr. Jamal Collin  . DILATION AND CURETTAGE OF UTERUS    . KNEE ARTHROSCOPY Bilateral   . KNEE SURGERY Left 2009, 2010, 2012  . MASTECTOMY Left 1990   BREAST CA  . TONSILLECTOMY       There were no vitals filed for this visit.  Subjective Assessment - 09/28/17 1620    Subjective  Patient reports tightness, fullness and pulling sensation in left knee.     Pertinent History  Patient reports she has had pain and multiple surgeries left knee since 2009; initial sx for partial meniscectomy, then debridement, and then 2012 another arthroscopy. Then underwent TKA 09/07/17 followed by home health PT x 2 weeks and is now referred to out patient physical therapy.     Limitations  Sitting;Standing;Walking;House hold activities;Other (comment) sleeping   sleeping   Patient Stated Goals  improve ROM, stregth and mobility with walking without AD    Currently in Pain?  Yes    Pain Score  2     Pain Location  Knee    Pain Orientation  Left    Pain Descriptors / Indicators  Aching;Tightness    Pain Type  Surgical pain 09/07/2017   09/07/2017   Pain Onset  1 to 4 weeks ago    Pain Frequency  Intermittent        Objective;  Gait: ambulating with SPC with mild antalgic gait pattern AAROM left knee pre treatment 0-110 flexion; post  treatment 120 degrees with mild to no pulling in left knee  Treatment:  Manual therapy: 15 min. Goal: pain, improve ROM, soft tissue elasticity STM performed to left LE hamstring, calf and quadriceps muscles with patient seated in chair with knee in flexed position  Therapeutic exercise: patient performed with instruction, demonstration and VC/tactile cues of therapist: goal: improve function, LEFS, independent with home program Sitting; MAI knee flexion left 30/60/90+ degrees flexion 3 sets, 5 second holds  Hip adduction with glute sets 15x 3 second holds Hip abduction with green resistive band x 15 reps   NuStep x 10 min. Pre treatment independent following instructions level #3   Patient response to treatment: patient demonstrated improved technique with exercises with minimal VC for correct alignment.  Patient with decreased spasms by 50%and  improved ROM by 10 degrees following STM.    PT Education - 09/28/17 1638    Education provided  Yes    Education Details  re assessed home exercises    Person(s) Educated  Patient    Methods  Explanation;Demonstration;Verbal cues    Comprehension  Returned demonstration;Verbal cues required;Verbalized understanding          PT Long Term Goals - 09/23/17 1550      PT LONG TERM GOAL #1   Title  Patient will demonstrate improved function with daily tasks involving left LE with LEFS score of 40/80    Baseline  LEFS 32/80    Status  New    Target Date  10/21/17      PT LONG TERM GOAL #2   Title  Patient will demonstrate improved function with daily tasks involving left LE with LEFS score of 50/80    Baseline  LEFS 32/80    Status  New    Target Date  11/18/17      PT LONG TERM GOAL #3   Title   Patient will be independent with ambulation with least restrictive AD on level surfaces and stairs by to demonstrate improved function for household and community ambulation    Baseline  requires walker or SPC for ambulation safely    Status  New    Target Date  10/21/17      PT LONG TERM GOAL #4   Title   Patient will demonstrate improved 10MW to 35m/s or better to demonstrate improved community ambulation    Baseline  10MW 20 seconds (0.44m/s)    Status  New    Target Date  11/04/17      PT LONG TERM GOAL #5   Title  Patient will be independent with home program for flexibility, strengthening and pain control to allow transition to self management once discharged from physical therapy    Baseline  requires moderate assistance, instruction for appropriate exercises and pain control strategies    Status  New    Target Date  11/18/17            Plan - 09/28/17 1645    Clinical Impression Statement Patient demonstrates good progress with improved AAROM left knee to 120 degrees following STM and exercise  with decreased pain to mild and pulling decreased in knee.  Patient should  continue to improve with additional physical therapy intervention to achieve goals.    Rehab Potential  Good    Clinical Impairments Affecting Rehab Potential  (+)motivated, acute condition (recent surgery), age(-)multiple left knee surgeries, history of CA, arthritis    PT Frequency  2x / week    PT Duration  8  weeks    PT Treatment/Interventions  Electrical Stimulation;Cryotherapy;Moist Heat;Gait training;Patient/family education;Neuromuscular re-education;Therapeutic exercise;Balance training;Manual techniques;Scar mobilization    PT Next Visit Plan  modailities for neuromuscular re education, progressive exercise for ROM, strength left LE    PT Home Exercise Plan  ROM every hour, elevate for swelling control, use of ice for pain, inflammation       Patient will benefit from skilled therapeutic intervention in order to improve the following deficits and impairments:  Decreased strength, Decreased activity tolerance, Pain, Decreased endurance, Decreased range of motion, Difficulty walking, Impaired perceived functional ability  Visit Diagnosis: Acute pain of left knee  Stiffness of left knee, not elsewhere classified  Muscle weakness (generalized)  Difficulty in walking, not elsewhere classified     Problem List Patient Active Problem List   Diagnosis Date Noted  . 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 Difficult airway for intubation 04/09/2015  . Chronic nasal congestion 04/09/2015  . Atrial fibrillation ([place]) 09/16/2013  . Obstructive sleep apnea 09/16/2013  . Morbid obesity ([place]) 09/16/2013  . Personal history of breast cancer 02/09/2013  . Hypertension   . Cancer [facility])     Jomarie Longs PT 09/29/2017, 5:24 PM  [facility] [address] [place], [address] Phone: [phone]   Fax:  [phone]  Name: Mandy Roberts MRN: [id number] Date of Birth: 1961/06/03

## 2017-09-30 ENCOUNTER — Encounter: Payer: Self-pay | Admitting: Physical Therapy

## 2017-09-30 ENCOUNTER — Ambulatory Visit: Payer: 59 | Admitting: Physical Therapy

## 2017-09-30 DIAGNOSIS — M6281 Muscle weakness (generalized): Secondary | ICD-10-CM | POA: Diagnosis not present

## 2017-09-30 DIAGNOSIS — M25562 Pain in left knee: Secondary | ICD-10-CM

## 2017-09-30 DIAGNOSIS — M25662 Stiffness of left knee, not elsewhere classified: Secondary | ICD-10-CM | POA: Diagnosis not present

## 2017-09-30 DIAGNOSIS — R262 Difficulty in walking, not elsewhere classified: Secondary | ICD-10-CM

## 2017-09-30 NOTE — Therapy (Signed)
[facility] [address] [place], [address] Phone: [phone]   Fax:  [phone]  Physical Therapy Treatment  Patient Details  Name: Mandy Roberts MRN: [id number] Date of Birth: 1961/05/28 Referring Provider: Vance Peper PA; Dereck Leep MD   Encounter Date: 09/30/2017  PT End of Session - 09/30/17 1455    Visit Number  3    Number of Visits  16    Date for PT Re-Evaluation  11/18/17    PT Start Time  3664    PT Stop Time  1427    PT Time Calculation (min)  39 min    Activity Tolerance  Patient tolerated treatment well    Behavior During Therapy  WFL for tasks assessed/performed       Past Medical History:  Diagnosis Date  . Atrial fibrillation ([place])   . Breast cancer ([place]) 1990/1997    left mastectomy ,had recurrence in left suprclavic nodes several yrs ago-treated with radiation and chemo  . Colon polyp 2008  . Complication of anesthesia   . Diabetes mellitus without complication (HCC)    history of, loss of 105 lbs  . Difficult intubation    expectorated blood 3-4 days after surgery in 2011  . Difficult intubation    hypoxia with endoscopy in 2016  . History of chemotherapy    5-FU/AC in 1990 under the care of Dr. Oliva Bustard. pt also tx with tamoxifen and arimidex  . History of radiation therapy    radiation treat at [facility]  . Hypertension 2008  . Personal history of chemotherapy 1990   BREAST CA  . Personal history of malignant neoplasm of breast 1990    left mastectomy   . Personal history of radiation therapy 1997   BREAST CA  . Sinusitis   . Sleep apnea    history of, went away after gastric surgery    Past Surgical History:  Procedure Laterality Date  . ABDOMINAL HYSTERECTOMY  1996  . COLONOSCOPY  2008   Dr. Jamal Collin  . DILATION AND CURETTAGE OF UTERUS    . KNEE ARTHROSCOPY Bilateral   . KNEE SURGERY Left 2009, 2010, 2012  . MASTECTOMY Left 1990   BREAST CA  . TONSILLECTOMY       There were no vitals filed for this visit.  Subjective Assessment - 09/30/17 1351    Subjective  Patient reports tightness, fullness and pulling sensation in left knee that has improved this week.      Pertinent History  Patient reports she has had pain and multiple surgeries left knee since 2009; initial sx for partial meniscectomy, then debridement, and then 2012 another arthroscopy. Then underwent TKA 09/07/17 followed by home health PT x 2 weeks and is now referred to out patient physical therapy.     Limitations  Sitting;Standing;Walking;House hold activities;Other (comment) sleeping    Patient Stated Goals  improve ROM, stregth and mobility with walking without AD    Currently in Pain?  No/denies just tight feeling         Objective;  Gait: ambulating with SPC with mild antalgic gait pattern AAROM left knee pre treatment and post treatment 120 degrees with mild to no pulling in left knee  Treatment:  Manual therapy: 8 min. Goal: pain, improve ROM, soft tissue elasticity STM performed to left LE hamstring, calf and quadriceps muscles with patient seated in chair with knee in flexed position  Therapeutic exercise: patient performed with instruction, demonstration  and VC/tactile cues of therapist: goal: improve function, LEFS, independent with home program Sitting; MAI knee flexion left 30/60/90+ degrees flexion 3 sets, 5 second holds  Hip adduction with glute sets 15x 3 second holds Standing: weight shifting on balance pad x 2 min. Forward and back and 1 min side to side Walk forward and backwards at counter for support with cuing for correct stance, wide BOS and correct sequence x 1 min.   NuStep x 10 min. At end of treatment independent following instructions level #3   Patient response to treatment: Patient demonstrated improved technique with exercises with minimal demonstration and VC for correct technique.Patient with improved left knee flexibility and soft tissue  elasticity by a least 30% following STM.      PT Education - 09/30/17 1433    Education provided  Yes    Education Details  exercise instruction    Person(s) Educated  Patient    Methods  Explanation;Demonstration;Verbal cues    Comprehension  Verbalized understanding;Returned demonstration;Verbal cues required          PT Long Term Goals - 09/23/17 1550      PT LONG TERM GOAL #1   Title  Patient will demonstrate improved function with daily tasks involving left LE with LEFS score of 40/80    Baseline  LEFS 32/80    Status  New    Target Date  10/21/17      PT LONG TERM GOAL #2   Title  Patient will demonstrate improved function with daily tasks involving left LE with LEFS score of 50/80    Baseline  LEFS 32/80    Status  New    Target Date  11/18/17      PT LONG TERM GOAL #3   Title   Patient will be independent with ambulation with least restrictive AD on level surfaces and stairs by to demonstrate improved function for household and community ambulation    Baseline  requires walker or SPC for ambulation safely    Status  New    Target Date  10/21/17      PT LONG TERM GOAL #4   Title   Patient will demonstrate improved 10MW to 62m/s or better to demonstrate improved community ambulation    Baseline  10MW 20 seconds (0.75m/s)    Status  New    Target Date  11/04/17      PT LONG TERM GOAL #5   Title  Patient will be independent with home program for flexibility, strengthening and pain control to allow transition to self management once discharged from physical therapy    Baseline  requires moderate assistance, instruction for appropriate exercises and pain control strategies    Status  New    Target Date  11/18/17            Plan - 09/30/17 1419    Clinical Impression Statement Patient is progressing well with goals and has120 degrees flexion. Patient has improved strength with repetition with MAI knee flexion end range and is progressing with exercises with  minimal cuing and demonstration.     Rehab Potential  Good    Clinical Impairments Affecting Rehab Potential  (+)motivated, acute condition (recent surgery), age(-)multiple left knee surgeries, history of CA, arthritis    PT Frequency  2x / week    PT Duration  8 weeks    PT Treatment/Interventions  Electrical Stimulation;Cryotherapy;Moist Heat;Gait training;Patient/family education;Neuromuscular re-education;Therapeutic exercise;Balance training;Manual techniques;Scar mobilization    PT Next Visit Plan  modailities  for neuromuscular re education, progressive exercise for ROM, strength left LE    PT Home Exercise Plan  ROM every hour, elevate for swelling control, use of ice for pain, inflammation       Patient will benefit from skilled therapeutic intervention in order to improve the following deficits and impairments:  Decreased strength, Decreased activity tolerance, Pain, Decreased endurance, Decreased range of motion, Difficulty walking, Impaired perceived functional ability  Visit Diagnosis: Acute pain of left knee  Stiffness of left knee, not elsewhere classified  Muscle weakness (generalized)  Difficulty in walking, not elsewhere classified     Problem List Patient Active Problem List   Diagnosis Date Noted  . 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 Difficult airway for intubation 04/09/2015  . Chronic nasal congestion 04/09/2015  . Atrial fibrillation ([place]) 09/16/2013  . Obstructive sleep apnea 09/16/2013  . Morbid obesity ([place]) 09/16/2013  . Personal history of breast cancer 02/09/2013  . Hypertension   . Cancer [facility])     Jomarie Longs PT 10/01/2017, 4:56 PM  [place] [facility] [address] [place], [address] Phone: [phone]   Fax:  [phone]  Name: Mandy Roberts MRN: [id number] Date of Birth: May 04, 1961

## 2017-10-06 ENCOUNTER — Encounter: Payer: Self-pay | Admitting: Physical Therapy

## 2017-10-06 ENCOUNTER — Ambulatory Visit: Payer: 59 | Admitting: Physical Therapy

## 2017-10-06 ENCOUNTER — Other Ambulatory Visit: Payer: Self-pay

## 2017-10-06 DIAGNOSIS — M25562 Pain in left knee: Secondary | ICD-10-CM

## 2017-10-06 DIAGNOSIS — R262 Difficulty in walking, not elsewhere classified: Secondary | ICD-10-CM

## 2017-10-06 DIAGNOSIS — M6281 Muscle weakness (generalized): Secondary | ICD-10-CM | POA: Diagnosis not present

## 2017-10-06 DIAGNOSIS — M25662 Stiffness of left knee, not elsewhere classified: Secondary | ICD-10-CM | POA: Diagnosis not present

## 2017-10-06 NOTE — Therapy (Signed)
[place] [facility] [address], [place], [address] Phone: [phone]   Fax:  [phone]  Physical Therapy Treatment  Patient Details  Name: Mandy Roberts MRN: [id number] Date of Birth: 09/04/1961 Referring Provider: Vance Peper PA; Dereck Leep MD   Encounter Date: 10/06/2017  PT End of Session - 10/06/17 1200    Visit Number  4    Number of Visits  16    Date for PT Re-Evaluation  11/18/17    PT Start Time  1109    PT Stop Time  1152    PT Time Calculation (min)  43 min    Activity Tolerance  Patient tolerated treatment well    Behavior During Therapy  WFL for tasks assessed/performed       Past Medical History:  Diagnosis Date  . Atrial fibrillation ([place])   . Breast cancer ([place]) 1990/1997    left mastectomy ,had recurrence in left suprclavic nodes several yrs ago-treated with radiation and chemo  . Colon polyp 2008  . Complication of anesthesia   . Diabetes mellitus without complication (HCC)    history of, loss of 105 lbs  . Difficult intubation    expectorated blood 3-4 days after surgery in 2011  . Difficult intubation    hypoxia with endoscopy in 2016  . History of chemotherapy    5-FU/AC in 1990 under the care of Dr. Oliva Bustard. pt also tx with tamoxifen and arimidex  . History of radiation therapy    radiation treat at [facility]  . Hypertension 2008  . Personal history of chemotherapy 1990   BREAST CA  . Personal history of malignant neoplasm of breast 1990    left mastectomy   . Personal history of radiation therapy 1997   BREAST CA  . Sinusitis   . Sleep apnea    history of, went away after gastric surgery    Past Surgical History:  Procedure Laterality Date  . ABDOMINAL HYSTERECTOMY  1996  . COLONOSCOPY  2008   Dr. Jamal Collin  . DILATION AND CURETTAGE OF UTERUS    . KNEE ARTHROSCOPY Bilateral   . KNEE SURGERY Left 2009, 2010, 2012  . MASTECTOMY Left 1990   BREAST CA  .  TONSILLECTOMY      There were no vitals filed for this visit.  Subjective Assessment - 10/06/17 1113    Subjective  Patient reports pinching sensation in left knee today that is worse with going down steps     Pertinent History  Patient reports she has had pain and multiple surgeries left knee since 2009; initial sx for partial meniscectomy, then debridement, and then 2012 another arthroscopy. Then underwent TKA 09/07/17 followed by home health PT x 2 weeks and is now referred to out patient physical therapy.     Limitations  Sitting;Standing;Walking;House hold activities;Other (comment) sleeping    Patient Stated Goals  improve ROM, stregth and mobility with walking without AD    Currently in Pain?  No/denies            Objective;  Gait: ambulating with SPC with mild antalgic gait pattern AAROM left knee pre treatment and post treatment 120 degrees with mild to no pulling in left knee Palpation: point tender left knee medial to incision over patella tendon; ? Scar tissue  Treatment:  Manual therapy:71min. Goal: pain, improve ROM, soft tissue elasticity STM performed to left LE hamstring, calf and quadriceps muscles with patient seated in chair with knee  in flexed position Ice massage performed with instruction for home to left knee anterior aspect over patella tendon  Therapeutic exercise:patient performed with instruction, demonstration and VC/tactile cues of therapist: goal: improve function, LEFS, independent with home program Sitting; MAI knee flexion left 30/60/90+ degrees flexion 3 sets, 5 second holds  Hip adduction with glute sets 15x 3 second holds balance beam walk side stepping x 1.5 min. With UE's support for balance, hip rotary machine left LE only 70# extension, 55#  hip abduction 2 x15 Instructed in hip abduction, extension and side stepping with resistive band home written instruction given  NuStep x 10 min. At end of treatment independent level #4  Patient  response to treatment: Patient with decreased pain to 0/10 and improved soft tissue elasticity following STM, ice massage. Patient able to perform exercises with improved technique and good alignment with minimal verbal cuing and demonstration.      PT Education - 10/06/17 1150    Education provided  Yes    Education Details  exercise instruction, self massage of patella tendon, ice massage for pain control, modify stair climbing to avoid increased strain on left knee for now    Person(s) Educated  Patient    Methods  Demonstration;Explanation;Verbal cues;Handout    Comprehension  Verbalized understanding;Returned demonstration;Verbal cues required          PT Long Term Goals - 09/23/17 1550      PT LONG TERM GOAL #1   Title  Patient will demonstrate improved function with daily tasks involving left LE with LEFS score of 40/80    Baseline  LEFS 32/80    Status  New    Target Date  10/21/17      PT LONG TERM GOAL #2   Title  Patient will demonstrate improved function with daily tasks involving left LE with LEFS score of 50/80    Baseline  LEFS 32/80    Status  New    Target Date  11/18/17      PT LONG TERM GOAL #3   Title   Patient will be independent with ambulation with least restrictive AD on level surfaces and stairs by to demonstrate improved function for household and community ambulation    Baseline  requires walker or SPC for ambulation safely    Status  New    Target Date  10/21/17      PT LONG TERM GOAL #4   Title   Patient will demonstrate improved 10MW to 68m/s or better to demonstrate improved community ambulation    Baseline  10MW 20 seconds (0.37m/s)    Status  New    Target Date  11/04/17      PT LONG TERM GOAL #5   Title  Patient will be independent with home program for flexibility, strengthening and pain control to allow transition to self management once discharged from physical therapy    Baseline  requires moderate assistance, instruction for  appropriate exercises and pain control strategies    Status  New    Target Date  11/18/17            Plan - 10/06/17 1200    Clinical Impression Statement  Patient demonstrated decreased pain to 0/10 with STM and ice massage today indicating decreased inflammation. She requires guidance and moderate cuing to perform exercise with good technique and appropriate intensity, good alignment.    Rehab Potential  Good    Clinical Impairments Affecting Rehab Potential  (+)motivated, acute condition (recent surgery), age(-)multiple  left knee surgeries, history of CA, arthritis    PT Frequency  2x / week    PT Duration  8 weeks    PT Treatment/Interventions  Electrical Stimulation;Cryotherapy;Moist Heat;Gait training;Patient/family education;Neuromuscular re-education;Therapeutic exercise;Balance training;Manual techniques;Scar mobilization    PT Next Visit Plan  modailities for neuromuscular re education, progressive exercise for ROM, strength left LE    PT Home Exercise Plan  ROM every hour, elevate for swelling control, use of ice for pain, inflammation       Patient will benefit from skilled therapeutic intervention in order to improve the following deficits and impairments:  Decreased strength, Decreased activity tolerance, Pain, Decreased endurance, Decreased range of motion, Difficulty walking, Impaired perceived functional ability  Visit Diagnosis: Acute pain of left knee  Stiffness of left knee, not elsewhere classified  Muscle weakness (generalized)  Difficulty in walking, not elsewhere classified     Problem List Patient Active Problem List   Diagnosis Date Noted  . 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 Difficult airway for intubation 04/09/2015  . Chronic nasal congestion 04/09/2015  . Atrial fibrillation ([place]) 09/16/2013  . Obstructive sleep apnea 09/16/2013  . Morbid obesity ([place]) 09/16/2013  . Personal history of breast cancer 02/09/2013  . Hypertension   .  Cancer [facility])     Jomarie Longs PT 10/07/2017, 4:39 PM  [facility] [address] [place], [address] Phone: [phone]   Fax:  [phone]  Name: Mandy Roberts MRN: [id number] Date of Birth: 1961-11-14

## 2017-10-08 ENCOUNTER — Other Ambulatory Visit: Payer: Self-pay

## 2017-10-08 ENCOUNTER — Encounter: Payer: Self-pay | Admitting: Physical Therapy

## 2017-10-08 ENCOUNTER — Ambulatory Visit: Payer: 59 | Admitting: Physical Therapy

## 2017-10-08 DIAGNOSIS — M25662 Stiffness of left knee, not elsewhere classified: Secondary | ICD-10-CM

## 2017-10-08 DIAGNOSIS — R262 Difficulty in walking, not elsewhere classified: Secondary | ICD-10-CM | POA: Diagnosis not present

## 2017-10-08 DIAGNOSIS — M25562 Pain in left knee: Secondary | ICD-10-CM

## 2017-10-08 DIAGNOSIS — M6281 Muscle weakness (generalized): Secondary | ICD-10-CM

## 2017-10-09 NOTE — Therapy (Signed)
[place] [facility] [address], [place], [address] Phone: [phone]   Fax:  [phone]  Physical Therapy Treatment  Patient Details  Name: Mandy Roberts MRN: [id number] Date of Birth: 1961-05-20 Referring Provider: Vance Peper PA; Dereck Leep MD   Encounter Date: 10/08/2017  PT End of Session - 10/08/17 1054    Visit Number  5    Number of Visits  16    Date for PT Re-Evaluation  11/18/17    PT Start Time  0958    PT Stop Time  1038    PT Time Calculation (min)  40 min    Activity Tolerance  Patient tolerated treatment well;Patient limited by pain;No increased pain    Behavior During Therapy  WFL for tasks assessed/performed       Past Medical History:  Diagnosis Date  . Atrial fibrillation ([place])   . Breast cancer ([place]) 1990/1997    left mastectomy ,had recurrence in left suprclavic nodes several yrs ago-treated with radiation and chemo  . Colon polyp 2008  . Complication of anesthesia   . Diabetes mellitus without complication (HCC)    history of, loss of 105 lbs  . Difficult intubation    expectorated blood 3-4 days after surgery in 2011  . Difficult intubation    hypoxia with endoscopy in 2016  . History of chemotherapy    5-FU/AC in 1990 under the care of Dr. Oliva Bustard. pt also tx with tamoxifen and arimidex  . History of radiation therapy    radiation treat at [facility]  . Hypertension 2008  . Personal history of chemotherapy 1990   BREAST CA  . Personal history of malignant neoplasm of breast 1990    left mastectomy   . Personal history of radiation therapy 1997   BREAST CA  . Sinusitis   . Sleep apnea    history of, went away after gastric surgery    Past Surgical History:  Procedure Laterality Date  . ABDOMINAL HYSTERECTOMY  1996  . COLONOSCOPY  2008   Dr. Jamal Collin  . DILATION AND CURETTAGE OF UTERUS    . KNEE ARTHROPLASTY Left 09/07/2017   Procedure: COMPUTER ASSISTED TOTAL KNEE  ARTHROPLASTY;  Surgeon: Dereck Leep, MD;  Location: [facility];  Service: Orthopedics;  Laterality: Left;  . KNEE ARTHROSCOPY Bilateral   . KNEE SURGERY Left 2009, 2010, 2012  . LAPAROSCOPIC GASTRIC RESTRICTIVE DUODENAL PROCEDURE (DUODENAL SWITCH) N/A 04/17/2015   Procedure: LAPAROSCOPIC GASTRIC RESTRICTIVE DUODENAL PROCEDURE, single anastamosis ;  Surgeon: Bonner Puna, MD;  Location: [facility] ORS;  Service: General;  Laterality: N/A;  . MASTECTOMY Left 1990   BREAST CA  . TONSILLECTOMY    . UPPER GI ENDOSCOPY N/A 04/17/2015   Procedure: UPPER GI ENDOSCOPY;  Surgeon: Bonner Puna, MD;  Location: [facility] ORS;  Service: General;  Laterality: N/A;    There were no vitals filed for this visit.  Subjective Assessment - 10/08/17 0959    Subjective  Patient reports contiiniued pinching sensation in left knee today that is worse with going down steps. She is more active and this may be contributing to symptoms.     Pertinent History  Patient reports she has had pain and multiple surgeries left knee since 2009; initial sx for partial meniscectomy, then debridement, and then 2012 another arthroscopy. Then underwent TKA 09/07/17 followed by home health PT x 2 weeks and is now referred to out patient physical therapy.  Limitations  Sitting;Standing;Walking;House hold activities;Other (comment) sleeping    Patient Stated Goals  improve ROM, stregth and mobility with walking without AD    Currently in Pain?  Other (Comment) pinching in left knee with extension and descending stairs         Objective;  Gait: ambulating with SPC with mild antalgic gait pattern AAROM left knee pre treatmentandpost treatment 120 degrees with mild to no pulling in left knee Palpation: point tender left knee medial to incision over patella tendon and inferior to patella with increased thickness in soft tissue  Treatment:  Manual therapy:41min. Goal: pain, improve ROM, soft tissue elasticity STM performed to left LE   quadriceps and patella tendon and scar muscles with patient seated in chair with knee in flexed position  Therapeutic exercise:patient performed with instruction, demonstration and VC/tactile cues of therapist: goal: improve function, LEFS, independent with home program Sitting; MAI knee flexion left 30/60/90+ degrees flexion 3 sets, 5 second holds  Hip adduction with glute sets 15x 3 second holds Knee flexion with yellow resistive band through 90 degrees of motion to avoid pain in knee hip rotary machine left LE only 70# extension, 55#  hip abduction 2 x15  Modalities: Electrical stimulation: 15 min.Russian stim. 10/10 cycle applied (2) electrodes to quadriceps/VMO left LE with patient reclined with LE supported on pillow; pt. Performing quad sets with each cycle; goal muscle re education High volt estim.clincial program for muscle spasms  (2) electrodes applied to medial and lateral aspect of left knee intensity to tolerance with patient in reclined position with LE supported on pillow goal: pain, spasms Ice pack applied to knee in conjunction with estim. For pain control: no adverse reactions noted   Patient response to treatment:Patient able to perform exercises with modifications without increased knee pain reported. Improved pain level to mild at end of session with knee extension and walking following modalities. Verbalized good understanding of home program for pain control and exercise.      PT Education - 10/08/17 1045    Education provided  Yes    Education Details  exercise instruction and self massage patella tendon, continue with pain control with ice, rest    Person(s) Educated  Patient    Methods  Explanation;Verbal cues;Demonstration    Comprehension  Verbalized understanding;Returned demonstration;Verbal cues required          PT Long Term Goals - 09/23/17 1550      PT LONG TERM GOAL #1   Title  Patient will demonstrate improved function with daily tasks  involving left LE with LEFS score of 40/80    Baseline  LEFS 32/80    Status  New    Target Date  10/21/17      PT LONG TERM GOAL #2   Title  Patient will demonstrate improved function with daily tasks involving left LE with LEFS score of 50/80    Baseline  LEFS 32/80    Status  New    Target Date  11/18/17      PT LONG TERM GOAL #3   Title   Patient will be independent with ambulation with least restrictive AD on level surfaces and stairs by to demonstrate improved function for household and community ambulation    Baseline  requires walker or SPC for ambulation safely    Status  New    Target Date  10/21/17      PT LONG TERM GOAL #4   Title   Patient will demonstrate improved 10MW to  64m/s or better to demonstrate improved community ambulation    Baseline  10MW 20 seconds (0.13m/s)    Status  New    Target Date  11/04/17      PT LONG TERM GOAL #5   Title  Patient will be independent with home program for flexibility, strengthening and pain control to allow transition to self management once discharged from physical therapy    Baseline  requires moderate assistance, instruction for appropriate exercises and pain control strategies    Status  New    Target Date  11/18/17            Plan - 10/08/17 1100    Clinical Impression Statement  Patient demonstrated improvement with pain and soft tissue mobility following treatment. She requires moderate cuing and guidance to perform appropriate exercises and for pain control as she continues healing from recent surgery.    Rehab Potential  Good    Clinical Impairments Affecting Rehab Potential  (+)motivated, acute condition (recent surgery), age(-)multiple left knee surgeries, history of CA, arthritis    PT Frequency  2x / week    PT Duration  8 weeks    PT Treatment/Interventions  Electrical Stimulation;Cryotherapy;Moist Heat;Gait training;Patient/family education;Neuromuscular re-education;Therapeutic exercise;Balance training;Manual  techniques;Scar mobilization    PT Next Visit Plan  modailities for neuromuscular re education, progressive exercise for ROM, strength left LE    PT Home Exercise Plan  ROM every hour, elevate for swelling control, use of ice for pain, inflammation       Patient will benefit from skilled therapeutic intervention in order to improve the following deficits and impairments:  Decreased strength, Decreased activity tolerance, Pain, Decreased endurance, Decreased range of motion, Difficulty walking, Impaired perceived functional ability  Visit Diagnosis: Acute pain of left knee  Stiffness of left knee, not elsewhere classified  Muscle weakness (generalized)  Difficulty in walking, not elsewhere classified     Problem List Patient Active Problem List   Diagnosis Date Noted  . 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 Difficult airway for intubation 04/09/2015  . Chronic nasal congestion 04/09/2015  . Atrial fibrillation ([place]) 09/16/2013  . Obstructive sleep apnea 09/16/2013  . Morbid obesity ([place]) 09/16/2013  . Personal history of breast cancer 02/09/2013  . Hypertension   . Cancer [facility])     Jomarie Longs PT 10/09/2017, 8:04 AM  [place] [facility] [address], [place], [address] Phone: [phone]   Fax:  [phone]  Name: CARSYNN BETHUNE MRN: [id number] Date of Birth: [birth year]

## 2017-10-12 ENCOUNTER — Ambulatory Visit
Admission: RE | Admit: 2017-10-12 | Discharge: 2017-10-12 | Disposition: A | Discharge status: Home / Self Care | Payer: 59 | Source: Ambulatory Visit | Attending: General Surgery | Admitting: General Surgery

## 2017-10-12 DIAGNOSIS — Z1231 Encounter for screening mammogram for malignant neoplasm of breast: Secondary | ICD-10-CM | POA: Diagnosis not present

## 2017-10-13 ENCOUNTER — Other Ambulatory Visit: Payer: Self-pay

## 2017-10-13 ENCOUNTER — Encounter: Payer: Self-pay | Admitting: Physical Therapy

## 2017-10-13 ENCOUNTER — Ambulatory Visit: Payer: 59 | Admitting: Physical Therapy

## 2017-10-13 DIAGNOSIS — M6281 Muscle weakness (generalized): Secondary | ICD-10-CM | POA: Diagnosis not present

## 2017-10-13 DIAGNOSIS — M25662 Stiffness of left knee, not elsewhere classified: Secondary | ICD-10-CM | POA: Diagnosis not present

## 2017-10-13 DIAGNOSIS — R262 Difficulty in walking, not elsewhere classified: Secondary | ICD-10-CM | POA: Diagnosis not present

## 2017-10-13 DIAGNOSIS — M25562 Pain in left knee: Secondary | ICD-10-CM

## 2017-10-13 NOTE — Therapy (Signed)
[place] [facility] [address], [place], [address] Phone: [phone]   Fax:  [phone]  Physical Therapy Treatment  Patient Details  Name: Mandy Roberts MRN: [id number] Date of Birth: [birth year] Referring Provider: Vance Peper PA; Dereck Leep MD   Encounter Date: 10/13/2017  PT End of Session - 10/13/17 1609    Visit Number  6    Number of Visits  16    Date for PT Re-Evaluation  11/18/17    PT Start Time  1519    PT Stop Time  5809    PT Time Calculation (min)  54 min    Activity Tolerance  Patient tolerated treatment well;Patient limited by pain;No increased pain    Behavior During Therapy  WFL for tasks assessed/performed       Past Medical History:  Diagnosis Date  . Atrial fibrillation ([place])   . Breast cancer ([place]) 1990/1997    left mastectomy ,had recurrence in left suprclavic nodes several yrs ago-treated with radiation and chemo  . Colon polyp 2008  . Complication of anesthesia   . Diabetes mellitus without complication (HCC)    history of, loss of 105 lbs  . Difficult intubation    expectorated blood 3-4 days after surgery in 2011  . Difficult intubation    hypoxia with endoscopy in 2016  . History of chemotherapy    5-FU/AC in 1990 under the care of Dr. Oliva Bustard. pt also tx with tamoxifen and arimidex  . History of radiation therapy    radiation treat at [facility]  . Hypertension 2008  . Personal history of chemotherapy 1990   BREAST CA  . Personal history of malignant neoplasm of breast 1990    left mastectomy   . Personal history of radiation therapy 1997   BREAST CA  . Sinusitis   . Sleep apnea    history of, went away after gastric surgery    Past Surgical History:  Procedure Laterality Date  . ABDOMINAL HYSTERECTOMY  1996  . COLONOSCOPY  2008   Dr. Jamal Collin  . DILATION AND CURETTAGE OF UTERUS    . KNEE ARTHROPLASTY Left 09/07/2017   Procedure: COMPUTER ASSISTED TOTAL KNEE  ARTHROPLASTY;  Surgeon: Dereck Leep, MD;  Location: [facility] ORS;  Service: Orthopedics;  Laterality: Left;  . KNEE ARTHROSCOPY Bilateral   . KNEE SURGERY Left 2009, 2010, 2012  . LAPAROSCOPIC GASTRIC RESTRICTIVE DUODENAL PROCEDURE (DUODENAL SWITCH) N/A 04/17/2015   Procedure: LAPAROSCOPIC GASTRIC RESTRICTIVE DUODENAL PROCEDURE, single anastamosis ;  Surgeon: Bonner Puna, MD;  Location: [facility] ORS;  Service: General;  Laterality: N/A;  . MASTECTOMY Left 1990   BREAST CA  . TONSILLECTOMY    . UPPER GI ENDOSCOPY N/A 04/17/2015   Procedure: UPPER GI ENDOSCOPY;  Surgeon: Bonner Puna, MD;  Location: [facility] ORS;  Service: General;  Laterality: N/A;    There were no vitals filed for this visit.  Subjective Assessment - 10/13/17 1518    Subjective  Patient reports mild to no pinching sensation in left knee today and much improved from previous session. She is also now walking without assistive device and able to sleep better and walk in community and grocery shop with minimal to no difficulty.       Pertinent History  Patient reports she has had pain and multiple surgeries left knee since 2009; initial sx for partial meniscectomy, then debridement, and then 2012 another arthroscopy. Then underwent TKA 09/07/17 followed by home health  PT x 2 weeks and is now referred to out patient physical therapy.     Limitations  Sitting;Standing;Walking;House hold activities;Other (comment) sleeping    Patient Stated Goals  improve ROM, stregth and mobility with walking without AD    Currently in Pain?  Other (Comment)          Objective;  Gait: ambulating independently without AD with mild limp AROM: left knee 0-120 degrees Observation/ palpation: mild to no swelling, mild warmth inferior aspect of left knee, incision, scar mobile    Treatment:   Therapeutic exercise:patient performed with instruction, demonstration and VC/tactile cues of therapist: goal: improve function, LEFS, independent with home  program Sitting; MAI knee flexion left 30/60/90+ degrees flexion 5 second holds, strong and no pain  hip rotary machine left LE only 85#/70# extension,55#/40# hip abduction 2 x15 (sore in right hip with stance) Side stepping along airex balance beam x 2 min. Running man each LE standing x 8 reps with repeated demonstration and VC for correct technique  NuStep pre exercise independent x 10 min. Level #4 intensity ;seat and arm #11 (unbilled time)  Modalities: Electrical stimulation: 15 min.Russian stim. 10/10 cycle applied (2) electrodes to quadriceps/VMO left LE with patient reclined with LE supported on pillow; pt. Performing quad sets with each cycle; goal muscle re education High volt estim.clincial program for muscle spasms  (2) electrodes applied to medial and lateral aspect of left knee intensity to tolerance with patient in reclined position with LE supported on pillow goal: pain, spasms Ice pack applied to knee in conjunction with estim. For pain control: no adverse reactions noted   Patient response to treatment:Patient demonstrated improved technique and motor control with exercises with repeated demonstration and verbal cuing. She reported mils soreness with exercises. decreased warmth in left knee to non palpable at end of session following modalities    PT Education - 10/13/17 1519    Education provided  Yes    Education Details  exercise instruction and re assessed home program    Person(s) Educated  Patient    Methods  Demonstration;Explanation;Verbal cues    Comprehension  Returned demonstration;Verbalized understanding;Verbal cues required          PT Long Term Goals - 09/23/17 1550      PT LONG TERM GOAL #1   Title  Patient will demonstrate improved function with daily tasks involving left LE with LEFS score of 40/80    Baseline  LEFS 32/80    Status  New    Target Date  10/21/17      PT LONG TERM GOAL #2   Title  Patient will demonstrate improved  function with daily tasks involving left LE with LEFS score of 50/80    Baseline  LEFS 32/80    Status  New    Target Date  11/18/17      PT LONG TERM GOAL #3   Title   Patient will be independent with ambulation with least restrictive AD on level surfaces and stairs by to demonstrate improved function for household and community ambulation    Baseline  requires walker or SPC for ambulation safely    Status  New    Target Date  10/21/17      PT LONG TERM GOAL #4   Title   Patient will demonstrate improved 10MW to 59m/s or better to demonstrate improved community ambulation    Baseline  10MW 20 seconds (0.62m/s)    Status  New    Target Date  11/04/17      PT LONG TERM GOAL #5   Title  Patient will be independent with home program for flexibility, strengthening and pain control to allow transition to self management once discharged from physical therapy    Baseline  requires moderate assistance, instruction for appropriate exercises and pain control strategies    Status  New    Target Date  11/18/17            Plan - 10/13/17 1611    Clinical Impression Statement  Patient demonstrates improvement with decreased pain, ambulating independetnly without AD and improving strength in left LE.  She continued with mild warmth and weakness in quadriceps and will benefit from additional physical therapy intervention to allow transition to independent home program.    Rehab Potential  Good    Clinical Impairments Affecting Rehab Potential  (+)motivated, acute condition (recent surgery), age(-)multiple left knee surgeries, history of CA, arthritis    PT Frequency  2x / week    PT Duration  8 weeks    PT Treatment/Interventions  Electrical Stimulation;Cryotherapy;Moist Heat;Gait training;Patient/family education;Neuromuscular re-education;Therapeutic exercise;Balance training;Manual techniques;Scar mobilization    PT Next Visit Plan  modailities for neuromuscular re education, progressive  exercise for ROM, strength left LE    PT Home Exercise Plan  ROM every hour, elevate for swelling control, use of ice for pain, inflammation       Patient will benefit from skilled therapeutic intervention in order to improve the following deficits and impairments:  Decreased strength, Decreased activity tolerance, Pain, Decreased endurance, Decreased range of motion, Difficulty walking, Impaired perceived functional ability  Visit Diagnosis: Acute pain of left knee  Stiffness of left knee, not elsewhere classified  Muscle weakness (generalized)  Difficulty in walking, not elsewhere classified     Problem List Patient Active Problem List   Diagnosis Date Noted  . 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 Difficult airway for intubation 04/09/2015  . Chronic nasal congestion 04/09/2015  . Atrial fibrillation ([place]) 09/16/2013  . Obstructive sleep apnea 09/16/2013  . Morbid obesity ([place]) 09/16/2013  . Personal history of breast cancer 02/09/2013  . Hypertension   . Cancer [facility])     Jomarie Longs PT 10/13/2017, 4:41 PM  [facility] [facility] [address] [place], [address] Phone: [phone]   Fax:  [phone]  Name: Mandy Roberts MRN: [id number] Date of Birth: [birth year]

## 2017-10-19 ENCOUNTER — Ambulatory Visit: Payer: 59 | Admitting: Physical Therapy

## 2017-10-19 ENCOUNTER — Encounter: Payer: Self-pay | Admitting: Physical Therapy

## 2017-10-19 DIAGNOSIS — M6281 Muscle weakness (generalized): Secondary | ICD-10-CM | POA: Diagnosis not present

## 2017-10-19 DIAGNOSIS — R262 Difficulty in walking, not elsewhere classified: Secondary | ICD-10-CM

## 2017-10-19 DIAGNOSIS — M25562 Pain in left knee: Secondary | ICD-10-CM

## 2017-10-19 DIAGNOSIS — M25662 Stiffness of left knee, not elsewhere classified: Secondary | ICD-10-CM

## 2017-10-19 NOTE — Therapy (Signed)
[facility] [address] [place], [address] Phone: [phone]   Fax:  [phone]  Physical Therapy Treatment  Patient Details  Name: KENOSHA DOSTER MRN: [id number] Date of Birth: April 30, 1961 Referring Provider: Vance Peper PA; Dereck Leep MD   Encounter Date: 10/19/2017  PT End of Session - 10/19/17 1140    Visit Number  7    Number of Visits  16    Date for PT Re-Evaluation  11/18/17    PT Start Time  1100    PT Stop Time  1155    PT Time Calculation (min)  55 min    Activity Tolerance  Patient tolerated treatment well;Patient limited by pain;No increased pain    Behavior During Therapy  WFL for tasks assessed/performed       Past Medical History:  Diagnosis Date  . Atrial fibrillation ([place])   . Breast cancer ([place]) 1990/1997    left mastectomy ,had recurrence in left suprclavic nodes several yrs ago-treated with radiation and chemo  . Colon polyp 2008  . Complication of anesthesia   . Diabetes mellitus without complication (HCC)    history of, loss of 105 lbs  . Difficult intubation    expectorated blood 3-4 days after surgery in 2011  . Difficult intubation    hypoxia with endoscopy in 2016  . History of chemotherapy    5-FU/AC in 1990 under the care of Dr. Oliva Bustard. pt also tx with tamoxifen and arimidex  . History of radiation therapy    radiation treat at [facility]  . Hypertension 2008  . Personal history of chemotherapy 1990   BREAST CA  . Personal history of malignant neoplasm of breast 1990    left mastectomy   . Personal history of radiation therapy 1997   BREAST CA  . Sinusitis   . Sleep apnea    history of, went away after gastric surgery    Past Surgical History:  Procedure Laterality Date  . ABDOMINAL HYSTERECTOMY  1996  . COLONOSCOPY  2008   Dr. Jamal Collin  . DILATION AND CURETTAGE OF UTERUS    . KNEE ARTHROPLASTY Left 09/07/2017   Procedure: COMPUTER ASSISTED TOTAL KNEE  ARTHROPLASTY;  Surgeon: Dereck Leep, MD;  Location: [facility];  Service: Orthopedics;  Laterality: Left;  . KNEE ARTHROSCOPY Bilateral   . KNEE SURGERY Left 2009, 2010, 2012  . LAPAROSCOPIC GASTRIC RESTRICTIVE DUODENAL PROCEDURE (DUODENAL SWITCH) N/A 04/17/2015   Procedure: LAPAROSCOPIC GASTRIC RESTRICTIVE DUODENAL PROCEDURE, single anastamosis ;  Surgeon: Bonner Puna, MD;  Location: [facility];  Service: General;  Laterality: N/A;  . MASTECTOMY Left 1990   BREAST CA  . TONSILLECTOMY    . UPPER GI ENDOSCOPY N/A 04/17/2015   Procedure: UPPER GI ENDOSCOPY;  Surgeon: Bonner Puna, MD;  Location: [facility] OR[facility];  Service: General;  Laterality: N/A;    There were no vitals filed for this visit.  Subjective Assessment - 10/19/17 1137    Subjective  Patient continues with reports of feeling tight around knee just below patella and behind knee feels pinching with flexion intermittently.    Pertinent History  Patient reports she has had pain and multiple surgeries left knee since 2009; initial sx for partial meniscectomy, then debridement, and then 2012 another arthroscopy. Then underwent TKA 09/07/17 followed by home health PT x 2 weeks and is now referred to out patient physical therapy.     Limitations  Sitting;Standing;Walking;House hold activities;Other (comment) sleeping  Patient Stated Goals  improve ROM, stregth and mobility with walking without AD    Currently in Pain?  Other (Comment) tightness around left knee inferior aspect          Objective;  Gait: ambulating independently without AD with mild limp AROM: left knee 0-120 degrees Observation/ palpation: mild to no swelling, mild warmth inferior aspect of left knee, incision, scar mobile   Treatment:   Therapeutic exercise:patient performed with instruction, demonstration and VC/tactile cues of therapist: goal: improve function, LEFS, independent with home program Sitting; MAI knee flexion left 30/60/90+ degrees flexion 5 second  holds, strong and no pain  Patient requested not to perform hip rotary machine today due to it making her hips sore NuStep pre exercise independent x 10 min. Level #4 intensity ;seat and arm #11 (unbilled time)  Modalities: Electrical stimulation:15 min.Russian stim. 10/10 cycle applied (2) electrodes to quadriceps/VMOleftLE with patient reclined with LE supported on pillow; pt. Performing quad sets with each cycle; goal muscle re education High volt estim.clincial program for muscle spasms (2) electrodes applied to medial and lateral aspect of left kneeintensity to tolerance with patient in reclined position with LE supported on pillow goal: pain, spasms Ice pack applied to knee in conjunction with estim. For pain control: no adverse reactions noted  Patient response to treatment:Patient demonstrated improved ROM with decreased pain at end of session. Decreased pain with ROM at end of session.   PT Education - 10/19/17 1139    Education provided  Yes    Education Details  progress with healing    Person(s) Educated  Patient    Methods  Explanation    Comprehension  Verbalized understanding          PT Long Term Goals - 09/23/17 1550      PT LONG TERM GOAL #1   Title  Patient will demonstrate improved function with daily tasks involving left LE with LEFS score of 40/80    Baseline  LEFS 32/80    Status  New    Target Date  10/21/17      PT LONG TERM GOAL #2   Title  Patient will demonstrate improved function with daily tasks involving left LE with LEFS score of 50/80    Baseline  LEFS 32/80    Status  New    Target Date  11/18/17      PT LONG TERM GOAL #3   Title   Patient will be independent with ambulation with least restrictive AD on level surfaces and stairs by to demonstrate improved function for household and community ambulation    Baseline  requires walker or SPC for ambulation safely    Status  New    Target Date  10/21/17      PT LONG TERM GOAL #4   Title    Patient will demonstrate improved 10MW to 66m/s or better to demonstrate improved community ambulation    Baseline  10MW 20 seconds (0.69m/s)    Status  New    Target Date  11/04/17      PT LONG TERM GOAL #5   Title  Patient will be independent with home program for flexibility, strengthening and pain control to allow transition to self management once discharged from physical therapy    Baseline  requires moderate assistance, instruction for appropriate exercises and pain control strategies    Status  New    Target Date  11/18/17            Plan -  10/19/17 1140    Clinical Impression Statement Patient demonstrates improvement with gait without AD, full knee AROM and progressing strength and endurance with daily activities. Her chief concern  is continued tightness around her knee.  She is progressing well with current physical therapy intervention and should continue to progress with continued physical therapy.    Rehab Potential  Good    Clinical Impairments Affecting Rehab Potential  (+)motivated, acute condition (recent surgery), age(-)multiple left knee surgeries, history of CA, arthritis    PT Frequency  2x / week    PT Duration  8 weeks    PT Treatment/Interventions  Electrical Stimulation;Cryotherapy;Moist Heat;Gait training;Patient/family education;Neuromuscular re-education;Therapeutic exercise;Balance training;Manual techniques;Scar mobilization    PT Next Visit Plan  modailities for neuromuscular re education, progressive exercise for ROM, strength left LE    PT Home Exercise Plan  ROM every hour, elevate for swelling control, use of ice for pain, inflammation       Patient will benefit from skilled therapeutic intervention in order to improve the following deficits and impairments:  Decreased strength, Decreased activity tolerance, Pain, Decreased endurance, Decreased range of motion, Difficulty walking, Impaired perceived functional ability  Visit Diagnosis: Acute pain  of left knee  Stiffness of left knee, not elsewhere classified  Muscle weakness (generalized)  Difficulty in walking, not elsewhere classified     Problem List Patient Active Problem List   Diagnosis Date Noted  . 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 Difficult airway for intubation 04/09/2015  . Chronic nasal congestion 04/09/2015  . Atrial fibrillation ([place]) 09/16/2013  . Obstructive sleep apnea 09/16/2013  . Morbid obesity ([place]) 09/16/2013  . Personal history of breast cancer 02/09/2013  . Hypertension   . Cancer [facility])     Jomarie Longs PT 10/19/2017, 5:08 PM  [facility] [address] [place], [address] Phone: [phone]   Fax:  [phone]  Name: SHUNTAVIA YERBY MRN: [id number] Date of Birth: 20-Nov-1961

## 2017-10-20 ENCOUNTER — Encounter: Payer: Self-pay | Admitting: General Surgery

## 2017-10-20 ENCOUNTER — Ambulatory Visit (INDEPENDENT_AMBULATORY_CARE_PROVIDER_SITE_OTHER): Payer: 59 | Admitting: General Surgery

## 2017-10-20 VITALS — BP 138/84 | HR 76 | Resp 14 | Ht 66.0 in | Wt 233.0 lb

## 2017-10-20 DIAGNOSIS — Z853 Personal history of malignant neoplasm of breast: Secondary | ICD-10-CM | POA: Diagnosis not present

## 2017-10-20 DIAGNOSIS — Z96652 Presence of left artificial knee joint: Secondary | ICD-10-CM | POA: Diagnosis not present

## 2017-10-20 DIAGNOSIS — Z471 Aftercare following joint replacement surgery: Secondary | ICD-10-CM | POA: Diagnosis not present

## 2017-10-20 NOTE — Patient Instructions (Addendum)
The patient is aware to call back for any questions or concerns. Colonoscopy due 2020 Right breast screening mammogram in 1 year with Dr Bary Castilla.

## 2017-10-20 NOTE — Progress Notes (Signed)
Patient ID: Mandy Roberts, female   DOB: 1961-02-20, 57 y.o.   MRN: [id number]  Chief Complaint  Patient presents with  . Follow-up    HPI Mandy Roberts is a 56 y.o. female who presents for follow up breast cancer and a breast evaluation. The most recent mammogram was done on 10/12/2017.  Patient does perform regular self breast checks and gets regular mammograms done.  No new breast issues. She has seen Dr Marla Roe for breast reconstruction consultation, and has submitted information to insurance. Recently had a left total knee replacement.   HPI  Past Medical History:  Diagnosis Date  . Atrial fibrillation ([place])   . Breast cancer ([place]) 1990/1997    left mastectomy ,had recurrence in left suprclavic nodes several yrs ago-treated with radiation and chemo  . Colon polyp 2008  . Complication of anesthesia   . Diabetes mellitus without complication (HCC)    history of, loss of 105 lbs  . Difficult intubation    expectorated blood 3-4 days after surgery in 2011  . Difficult intubation    hypoxia with endoscopy in 2016  . History of chemotherapy    5-FU/AC in 1990 under the care of Dr. Oliva Bustard. pt also tx with tamoxifen and arimidex  . History of radiation therapy    radiation treat at [facility]  . Hypertension 2008  . Personal history of chemotherapy 1990   BREAST CA  . Personal history of malignant neoplasm of breast 1990    left mastectomy   . Personal history of radiation therapy 1997   BREAST CA  . Sinusitis   . Sleep apnea    history of, went away after gastric surgery    Past Surgical History:  Procedure Laterality Date  . ABDOMINAL HYSTERECTOMY  1996  . COLONOSCOPY  2008, 2015   Dr. Jamal Collin  . DILATION AND CURETTAGE OF UTERUS    . KNEE ARTHROPLASTY Left 09/07/2017   Procedure: COMPUTER ASSISTED TOTAL KNEE ARTHROPLASTY;  Surgeon: Dereck Leep, MD;  Location: [facility];  Service: Orthopedics;  Laterality: Left;  . KNEE ARTHROSCOPY Bilateral   . KNEE SURGERY Left  2009, 2010, 2012  . LAPAROSCOPIC GASTRIC RESTRICTIVE DUODENAL PROCEDURE (DUODENAL SWITCH) N/A 04/17/2015   Procedure: LAPAROSCOPIC GASTRIC RESTRICTIVE DUODENAL PROCEDURE, single anastamosis ;  Surgeon: Bonner Puna, MD;  Location: [facility];  Service: General;  Laterality: N/A;  . MASTECTOMY Left 1990   BREAST CA  . TONSILLECTOMY    . UPPER GI ENDOSCOPY N/A 04/17/2015   Procedure: UPPER GI ENDOSCOPY;  Surgeon: Bonner Puna, MD;  Location: [facility];  Service: General;  Laterality: N/A;    Family History  Problem Relation Age of Onset  . Heart failure Mother   . Hypertension Mother   . Hyperlipidemia Mother   . Kidney disease Mother   . CVA Mother   . Hypothyroidism Mother   . Breast cancer Maternal Aunt 76    Social History Social History   Tobacco Use  . Smoking status: Never Smoker  . Smokeless tobacco: Never Used  Substance Use Topics  . Alcohol use: No  . Drug use: No    Allergies  Allergen Reactions  . Tdap [Diphth-Acell Pertussis-Tetanus] Shortness Of Breath  . Penicillins Rash    Has patient had a PCN reaction causing immediate rash, facial/tongue/throat swelling, SOB or lightheadedness with hypotension: Yes Has patient had a PCN reaction causing severe rash involving mucus membranes or skin necrosis: No Has patient had a PCN reaction that required  hospitalization: No Has patient had a PCN reaction occurring within the last 10 years: No If all of the above answers are "NO", then may proceed with Cephalosporin use.     Current Outpatient Medications  Medication Sig Dispense Refill  . apixaban (ELIQUIS) 5 MG TABS tablet Take 5 mg by mouth 2 (two) times daily.    . furosemide (LASIX) 20 MG tablet Take 20 mg by mouth.    . losartan-hydrochlorothiazide (HYZAAR) 50-12.5 MG tablet Take 1 tablet by mouth daily.    . Multiple Vitamin (MULTIVITAMIN WITH MINERALS) TABS tablet Take 1 tablet by mouth daily.    . sotalol (BETAPACE) 80 MG tablet Take 80 mg by mouth 2 (two) times  daily.    . traMADol (ULTRAM) 50 MG tablet Take 1-2 tablets (50-100 mg total) by mouth every 4 (four) hours as needed for moderate pain. 60 tablet 0   No current facility-administered medications for this visit.     Review of Systems Review of Systems  Constitutional: Negative.   Respiratory: Negative.   Cardiovascular: Negative.     Blood pressure 138/84, pulse 76, resp. rate 14, height 5\' 6"  (1.676 m), weight 233 lb (105.7 kg).  Physical Exam Physical Exam  Constitutional: She is oriented to person, place, and time. She appears well-developed and well-nourished.  HENT:  Mouth/Throat: Oropharynx is clear and moist.  Eyes: Conjunctivae are normal. No scleral icterus.  Neck: Neck supple.  Cardiovascular: Normal rate, regular rhythm and normal heart sounds.  Pulmonary/Chest: Effort normal and breath sounds normal. No respiratory distress. Right breast exhibits no inverted nipple, no mass, no nipple discharge, no skin change and no tenderness.    Abdominal: Soft. Bowel sounds are normal. There is no hepatosplenomegaly. There is no tenderness.  Lymphadenopathy:    She has no cervical adenopathy.    She has no axillary adenopathy.  Neurological: She is alert and oriented to person, place, and time.  Skin: Skin is warm and dry.  Psychiatric: Her behavior is normal.    Data Reviewed Mammogram, colonoscopy, and previous notes reviewed Mammogram revealed no findings suspicious for malignancy.  Colonoscopy 10/11/14 revealed no abnormal findings.   Assessment    Stable exam. History of left breast cancer s/p 28 years with recurrence in left supraclavicular node. Treated with chemoradiation. Stable exam and mammogram. Seeing Dr. Marla Roe for potential breast reconstruction.  S/p gastric bypass in 2016, lost 105 lbs. Last colonoscopy 10/11/14, repeat in 5 years.      Plan    Continue to follow up with Dr Marla Roe as scheduled. Right breast screening mammogram in 1 year with  Dr Bary Castilla. Colonoscopy due 2020.       HPI, Physical Exam, Assessment and Plan have been scribed under the direction and in the presence of Mckinley Jewel, MD Karie Fetch, RN   Junie Panning G 10/21/2017, 2:07 PM

## 2017-10-21 ENCOUNTER — Ambulatory Visit: Payer: 59 | Admitting: Physical Therapy

## 2017-10-26 ENCOUNTER — Ambulatory Visit: Payer: 59 | Admitting: Physical Therapy

## 2017-10-29 ENCOUNTER — Ambulatory Visit: Payer: 59 | Admitting: Physical Therapy

## 2017-11-02 ENCOUNTER — Ambulatory Visit: Payer: 59 | Admitting: Physical Therapy

## 2017-11-05 ENCOUNTER — Ambulatory Visit: Payer: 59 | Admitting: Physical Therapy

## 2017-11-09 ENCOUNTER — Ambulatory Visit: Payer: 59 | Admitting: Physical Therapy

## 2017-11-12 ENCOUNTER — Emergency Department: Payer: 59

## 2017-11-12 ENCOUNTER — Ambulatory Visit (INDEPENDENT_AMBULATORY_CARE_PROVIDER_SITE_OTHER): Payer: 59 | Admitting: General Surgery

## 2017-11-12 ENCOUNTER — Other Ambulatory Visit: Payer: Self-pay

## 2017-11-12 ENCOUNTER — Emergency Department
Admission: EM | Admit: 2017-11-12 | Discharge: 2017-11-12 | Disposition: A | Discharge status: Home / Self Care | Payer: 59 | Attending: Emergency Medicine | Admitting: Emergency Medicine

## 2017-11-12 ENCOUNTER — Encounter: Payer: Self-pay | Admitting: General Surgery

## 2017-11-12 ENCOUNTER — Ambulatory Visit: Payer: 59 | Admitting: Physical Therapy

## 2017-11-12 VITALS — BP 146/94 | HR 90 | Resp 16 | Ht 66.0 in | Wt 234.0 lb

## 2017-11-12 DIAGNOSIS — I10 Essential (primary) hypertension: Secondary | ICD-10-CM | POA: Diagnosis not present

## 2017-11-12 DIAGNOSIS — Z96652 Presence of left artificial knee joint: Secondary | ICD-10-CM | POA: Diagnosis not present

## 2017-11-12 DIAGNOSIS — Z7901 Long term (current) use of anticoagulants: Secondary | ICD-10-CM | POA: Diagnosis not present

## 2017-11-12 DIAGNOSIS — R1013 Epigastric pain: Secondary | ICD-10-CM | POA: Diagnosis not present

## 2017-11-12 DIAGNOSIS — Z79899 Other long term (current) drug therapy: Secondary | ICD-10-CM | POA: Diagnosis not present

## 2017-11-12 DIAGNOSIS — Z853 Personal history of malignant neoplasm of breast: Secondary | ICD-10-CM | POA: Insufficient documentation

## 2017-11-12 DIAGNOSIS — R1011 Right upper quadrant pain: Secondary | ICD-10-CM

## 2017-11-12 DIAGNOSIS — Z9012 Acquired absence of left breast and nipple: Secondary | ICD-10-CM | POA: Diagnosis not present

## 2017-11-12 DIAGNOSIS — R079 Chest pain, unspecified: Secondary | ICD-10-CM | POA: Diagnosis not present

## 2017-11-12 DIAGNOSIS — R0789 Other chest pain: Secondary | ICD-10-CM | POA: Diagnosis not present

## 2017-11-12 DIAGNOSIS — E119 Type 2 diabetes mellitus without complications: Secondary | ICD-10-CM | POA: Insufficient documentation

## 2017-11-12 DIAGNOSIS — K828 Other specified diseases of gallbladder: Secondary | ICD-10-CM | POA: Diagnosis not present

## 2017-11-12 LAB — LIPASE, BLOOD: LIPASE: 20 U/L (ref 11–51)

## 2017-11-12 LAB — COMPREHENSIVE METABOLIC PANEL
ALT: 50 U/L (ref 14–54)
AST: 86 U/L — AB (ref 15–41)
Albumin: 3.9 g/dL (ref 3.5–5.0)
Alkaline Phosphatase: 135 U/L — ABNORMAL HIGH (ref 38–126)
Anion gap: 6 (ref 5–15)
BUN: 15 mg/dL (ref 6–20)
CHLORIDE: 107 mmol/L (ref 101–111)
CO2: 27 mmol/L (ref 22–32)
Calcium: 9.2 mg/dL (ref 8.9–10.3)
Creatinine, Ser: 0.71 mg/dL (ref 0.44–1.00)
Glucose, Bld: 102 mg/dL — ABNORMAL HIGH (ref 65–99)
POTASSIUM: 3.5 mmol/L (ref 3.5–5.1)
SODIUM: 140 mmol/L (ref 135–145)
Total Bilirubin: 0.6 mg/dL (ref 0.3–1.2)
Total Protein: 7.5 g/dL (ref 6.5–8.1)

## 2017-11-12 LAB — CBC WITH DIFFERENTIAL/PLATELET
BASOS ABS: 0 10*3/uL (ref 0–0.1)
Basophils Relative: 0 %
EOS ABS: 0.1 10*3/uL (ref 0–0.7)
EOS PCT: 2 %
HCT: 42.6 % (ref 35.0–47.0)
Hemoglobin: 13.6 g/dL (ref 12.0–16.0)
LYMPHS PCT: 22 %
Lymphs Abs: 1 10*3/uL (ref 1.0–3.6)
MCH: 28.8 pg (ref 26.0–34.0)
MCHC: 31.9 g/dL — ABNORMAL LOW (ref 32.0–36.0)
MCV: 90.2 fL (ref 80.0–100.0)
MONO ABS: 0.2 10*3/uL (ref 0.2–0.9)
Monocytes Relative: 4 %
Neutro Abs: 3.2 10*3/uL (ref 1.4–6.5)
Neutrophils Relative %: 72 %
PLATELETS: 139 10*3/uL — AB (ref 150–440)
RBC: 4.72 MIL/uL (ref 3.80–5.20)
RDW: 13.5 % (ref 11.5–14.5)
WBC: 4.5 10*3/uL (ref 3.6–11.0)

## 2017-11-12 LAB — TROPONIN I

## 2017-11-12 MED ORDER — SODIUM CHLORIDE 0.9 % IV BOLUS (SEPSIS)
1000.0000 mL | Freq: Once | INTRAVENOUS | Status: AC
  Administered 2017-11-12: 1000 mL via INTRAVENOUS

## 2017-11-12 MED ORDER — ONDANSETRON HCL 4 MG/2ML IJ SOLN
4.0000 mg | Freq: Once | INTRAMUSCULAR | Status: AC
  Administered 2017-11-12: 4 mg via INTRAVENOUS
  Filled 2017-11-12: qty 2

## 2017-11-12 MED ORDER — FAMOTIDINE IN NACL 20-0.9 MG/50ML-% IV SOLN
20.0000 mg | Freq: Once | INTRAVENOUS | Status: AC
  Administered 2017-11-12: 20 mg via INTRAVENOUS
  Filled 2017-11-12: qty 50

## 2017-11-12 MED ORDER — FENTANYL CITRATE (PF) 100 MCG/2ML IJ SOLN
50.0000 ug | Freq: Once | INTRAMUSCULAR | Status: AC
  Administered 2017-11-12: 50 ug via INTRAVENOUS
  Filled 2017-11-12: qty 2

## 2017-11-12 NOTE — ED Provider Notes (Signed)
[facility] Emergency Department Provider Note   ____________________________________________   First MD Initiated Contact with Patient 11/12/17 [phone]     (approximate)  I have reviewed the triage vital signs and the nursing notes.   HISTORY  Chief Complaint Chest Pain    HPI Mandy Roberts is a 56 y.o. female who presents to the ED from home with a chief complaint of substernal chest/epigastric pain.  Patient awoke approximately 1 AM with pressure in her lower chest/epigastrium.  Describes pain as waxing/waning, now radiating to her right flank.  Symptoms associated with nausea and several loose stools.  Denies recent fever, chills, shortness of breath, vomiting, dysuria.  Denies recent travel or trauma.   Past Medical History:  Diagnosis Date  . Atrial fibrillation ([place])   . Breast cancer ([place]) 1990/1997    left mastectomy ,had recurrence in left suprclavic nodes several yrs ago-treated with radiation and chemo  . Colon polyp 2008  . Complication of anesthesia   . Diabetes mellitus without complication (HCC)    history of, loss of 105 lbs  . Difficult intubation    expectorated blood 3-4 days after surgery in 2011  . Difficult intubation    hypoxia with endoscopy in 2016  . History of chemotherapy    5-FU/AC in 1990 under the care of Dr. Oliva Bustard. pt also tx with tamoxifen and arimidex  . History of radiation therapy    radiation treat at [facility]  . Hypertension 2008  . Personal history of chemotherapy 1990   BREAST CA  . Personal history of malignant neoplasm of breast 1990    left mastectomy   . Personal history of radiation therapy 1997   BREAST CA  . Sinusitis   . Sleep apnea    history of, went away after gastric surgery    Patient Active Problem List   Diagnosis Date Noted  . 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 Difficult airway for intubation 04/09/2015  . Chronic nasal congestion 04/09/2015  . Atrial fibrillation ([place])  09/16/2013  . Obstructive sleep apnea 09/16/2013  . Morbid obesity ([place]) 09/16/2013  . Personal history of breast cancer 02/09/2013  . Hypertension   . Cancer [facility])     Past Surgical History:  Procedure Laterality Date  . ABDOMINAL HYSTERECTOMY  1996  . COLONOSCOPY  2008, 2015   Dr. Jamal Collin  . DILATION AND CURETTAGE OF UTERUS    . KNEE ARTHROPLASTY Left 09/07/2017   Procedure: COMPUTER ASSISTED TOTAL KNEE ARTHROPLASTY;  Surgeon: Dereck Leep, MD;  Location: [facility] ORS;  Service: Orthopedics;  Laterality: Left;  . KNEE ARTHROSCOPY Bilateral   . KNEE SURGERY Left 2009, 2010, 2012  . LAPAROSCOPIC GASTRIC RESTRICTIVE DUODENAL PROCEDURE (DUODENAL SWITCH) N/A 04/17/2015   Procedure: LAPAROSCOPIC GASTRIC RESTRICTIVE DUODENAL PROCEDURE, single anastamosis ;  Surgeon: Bonner Puna, MD;  Location: [facility] ORS;  Service: General;  Laterality: N/A;  . MASTECTOMY Left 1990   BREAST CA  . TONSILLECTOMY    . UPPER GI ENDOSCOPY N/A 04/17/2015   Procedure: UPPER GI ENDOSCOPY;  Surgeon: Bonner Puna, MD;  Location: [facility] ORS;  Service: General;  Laterality: N/A;    Prior to Admission medications   Medication Sig Start Date End Date Taking? Authorizing Provider  apixaban (ELIQUIS) 5 MG TABS tablet Take 5 mg by mouth 2 (two) times daily.    [provider]  furosemide (LASIX) 20 MG tablet Take 20 mg by mouth.    [provider]  losartan-hydrochlorothiazide Konrad Penta)  50-12.5 MG tablet Take 1 tablet by mouth daily.    [provider]  Multiple Vitamin (MULTIVITAMIN WITH MINERALS) TABS tablet Take 1 tablet by mouth daily.    [provider]  sotalol (BETAPACE) 80 MG tablet Take 80 mg by mouth 2 (two) times daily.    [provider]  traMADol (ULTRAM) 50 MG tablet Take 1-2 tablets (50-100 mg total) by mouth every 4 (four) hours as needed for moderate pain. 09/08/17   Watt Climes, PA    Allergies Tdap [diphth-acell pertussis-tetanus] and Penicillins  Family  History  Problem Relation Age of Onset  . Heart failure Mother   . Hypertension Mother   . Hyperlipidemia Mother   . Kidney disease Mother   . CVA Mother   . Hypothyroidism Mother   . Breast cancer Maternal Aunt 76    Social History Social History   Tobacco Use  . Smoking status: Never Smoker  . Smokeless tobacco: Never Used  Substance Use Topics  . Alcohol use: No  . Drug use: No    Review of Systems  Constitutional: No fever/chills. Eyes: No visual changes. ENT: No sore throat. Cardiovascular: Positive for chest pain. Respiratory: Denies shortness of breath. Gastrointestinal: Positive for abdominal pain and nausea, no vomiting.  Positive for diarrhea.  No constipation. Genitourinary: Negative for dysuria. Musculoskeletal: Negative for back pain. Skin: Negative for rash. Neurological: Negative for headaches, focal weakness or numbness.   ____________________________________________   PHYSICAL EXAM:  VITAL SIGNS: ED Triage Vitals  Enc Vitals Group     BP      Pulse      Resp      Temp      Temp src      SpO2      Weight      Height      Head Circumference      Peak Flow      Pain Score      Pain Loc      Pain Edu?      Excl. in [place]?     Constitutional: Alert and oriented.  Uncomfortable appearing and in moderate acute distress. Eyes: Conjunctivae are normal. PERRL. EOMI. Head: Atraumatic. Nose: No congestion/rhinnorhea. Mouth/Throat: Mucous membranes are moist.  Oropharynx non-erythematous. Neck: No stridor.   Cardiovascular: Tachycardic rate, regular rhythm. Grossly normal heart sounds.  Good peripheral circulation. Respiratory: Normal respiratory effort.  No retractions. Lungs CTAB. Gastrointestinal: Soft and mildly tender to palpation epigastrium and right upper quadrant without rebound or guarding. No distention. No abdominal bruits. No CVA tenderness. Musculoskeletal: No lower extremity tenderness nor edema.  No joint effusions. Neurologic:   Normal speech and language. No gross focal neurologic deficits are appreciated. No gait instability. Skin:  Skin is warm, dry and intact. No rash noted. Psychiatric: Mood and affect are normal. Speech and behavior are normal.  ____________________________________________   LABS (all labs ordered are listed, but only abnormal results are displayed)  Labs Reviewed  CBC WITH DIFFERENTIAL/PLATELET - Abnormal; Notable for the following components:      Result Value   MCHC 31.9 (*)    Platelets 139 (*)    All other components within normal limits  COMPREHENSIVE METABOLIC PANEL  LIPASE, BLOOD  TROPONIN I   ____________________________________________  EKG  ED ECG REPORT I, Hargun Spurling J, the attending physician, personally viewed and interpreted this ECG.   Date: 11/12/2017  EKG Time: 0638  Rate: 91  Rhythm: normal EKG, normal sinus rhythm  Axis: Normal  Intervals:none  ST&T Change: Nonspecific  ____________________________________________  RADIOLOGY  No results found.  ____________________________________________   PROCEDURES  Procedure(s) performed: None  Procedures  Critical Care performed: No  ____________________________________________   INITIAL IMPRESSION / ASSESSMENT AND PLAN / ED COURSE  As part of my medical decision making, I reviewed the following data within the [place] notes reviewed and incorporated, Labs reviewed, EKG interpreted, Old chart reviewed, Radiograph reviewed and Notes from prior ED visits.   56 year old female with atrial fibrillation on Eliquis, status post duodenal switch in 2016 who presents with substernal chest/upper abdominal pain since 1 AM associated with nausea and diarrhea. Differential diagnosis includes, but is not limited to, ACS, aortic dissection, pulmonary embolism, cardiac tamponade, pneumothorax, pneumonia, pericarditis, myocarditis, GI-related causes including esophagitis/gastritis, and  musculoskeletal chest wall pain.    Will obtain screening lab work including LFTs, lipase, troponin.  Initiate IV fluid resuscitation, IV analgesia/antiemetic.  Will obtain right upper quadrant abdominal ultrasound to evaluate for cholecystitis; will also evaluate for portal vein thrombosis given patient has had prior gastric bypass surgery.  Clinical Course as of Nov 12 702  Thu Nov 12, 2017  0700 Some relief after fentanyl although patient still feels pain in her epigastrium radiating into her right flank.  Will administer IV Pepcid.  Care transferred to Dr. Archie Balboa pending laboratory and imaging results and disposition.  [JS]    Clinical Course User Index [JS] Paulette Blanch, MD     ____________________________________________   FINAL CLINICAL IMPRESSION(S) / ED DIAGNOSES  Final diagnoses:  Chest pain, unspecified type  Epigastric pain     ED Discharge Orders    None       Note:  This document was prepared using Dragon voice recognition software and may include unintentional dictation errors.    Paulette Blanch, MD 11/12/17 [phone]

## 2017-11-12 NOTE — Progress Notes (Signed)
Patient ID: Mandy Roberts, female   DOB: [birth year], 56 y.o.   MRN: [id number]  Chief Complaint  Patient presents with  . Abdominal Pain    HPI Mandy Roberts is a 56 y.o. female.  Here for abdominal pain. She went to the ED this morning. She states the pain started around 1 am central chest . She is having right abdominal pain radiating to her back. Abdominal U/S was done in the ED. She states she has not had this pain in the past. She admits to diarrhea and nausea, but no vomiting.  HPI  Past Medical History:  Diagnosis Date  . Atrial fibrillation ([place])   . Breast cancer ([place]) 1990/1997    left mastectomy ,had recurrence in left suprclavic nodes several yrs ago-treated with radiation and chemo  . Colon polyp 2008  . Complication of anesthesia   . Diabetes mellitus without complication ([facility])    history of, loss of 105 lbs  . Difficult intubation    expectorated blood 3-4 days after surgery in 2011  . Difficult intubation    hypoxia with endoscopy in 2016  . History of chemotherapy    5-FU/AC in 1990 under the care of Dr. Oliva Bustard. pt also tx with tamoxifen and arimidex  . History of radiation therapy    radiation treat at [facility]  . Hypertension 2008  . Personal history of chemotherapy 1990   BREAST CA  . Personal history of malignant neoplasm of breast 1990    left mastectomy   . Personal history of radiation therapy 1997   BREAST CA  . Sinusitis   . Sleep apnea    history of, went away after gastric surgery    Past Surgical History:  Procedure Laterality Date  . ABDOMINAL HYSTERECTOMY  1996  . COLONOSCOPY  2008, 2015   Dr. Jamal Collin  . DILATION AND CURETTAGE OF UTERUS    . KNEE ARTHROPLASTY Left 09/07/2017   Procedure: COMPUTER ASSISTED TOTAL KNEE ARTHROPLASTY;  Surgeon: Dereck Leep, MD;  Location: [facility] ORS;  Service: Orthopedics;  Laterality: Left;  . KNEE ARTHROSCOPY Bilateral   . KNEE SURGERY Left 2009, 2010, 2012  . LAPAROSCOPIC GASTRIC RESTRICTIVE DUODENAL  PROCEDURE (DUODENAL SWITCH) N/A 04/17/2015   Procedure: LAPAROSCOPIC GASTRIC RESTRICTIVE DUODENAL PROCEDURE, single anastamosis ;  Surgeon: Bonner Puna, MD;  Location: [facility] ORS;  Service: General;  Laterality: N/A;  . MASTECTOMY Left 1990   BREAST CA  . TONSILLECTOMY    . UPPER GI ENDOSCOPY N/A 04/17/2015   Procedure: UPPER GI ENDOSCOPY;  Surgeon: Bonner Puna, MD;  Location: [facility] ORS;  Service: General;  Laterality: N/A;    Family History  Problem Relation Age of Onset  . Heart failure Mother   . Hypertension Mother   . Hyperlipidemia Mother   . Kidney disease Mother   . CVA Mother   . Hypothyroidism Mother   . Breast cancer Maternal Aunt 76    Social History Social History   Tobacco Use  . Smoking status: Never Smoker  . Smokeless tobacco: Never Used  Substance Use Topics  . Alcohol use: No  . Drug use: No    Allergies  Allergen Reactions  . Tdap [Diphth-Acell Pertussis-Tetanus] Shortness Of Breath  . Penicillins Rash    Has patient had a PCN reaction causing immediate rash, facial/tongue/throat swelling, SOB or lightheadedness with hypotension: Yes Has patient had a PCN reaction causing severe rash involving mucus membranes or skin necrosis: No Has patient had a PCN reaction that  required hospitalization: No Has patient had a PCN reaction occurring within the last 10 years: No If all of the above answers are "NO", then may proceed with Cephalosporin use.     Current Outpatient Medications  Medication Sig Dispense Refill  . apixaban (ELIQUIS) 5 MG TABS tablet Take 5 mg by mouth 2 (two) times daily.    Marland Kitchen losartan-hydrochlorothiazide (HYZAAR) 50-12.5 MG tablet Take 1 tablet by mouth daily.    . Multiple Vitamin (MULTIVITAMIN WITH MINERALS) TABS tablet Take 1 tablet by mouth daily.    . sotalol (BETAPACE) 80 MG tablet Take 80 mg by mouth 2 (two) times daily.    . traMADol (ULTRAM) 50 MG tablet Take 1-2 tablets (50-100 mg total) by mouth every 4 (four) hours as needed for  moderate pain. 60 tablet 0   No current facility-administered medications for this visit.     Review of Systems Review of Systems  Constitutional: Positive for fever.  Respiratory: Negative.   Cardiovascular: Negative.   Gastrointestinal: Positive for abdominal pain, diarrhea and nausea.  Neurological: Positive for weakness.    Blood pressure (!) 146/94, pulse 90, resp. rate 16, height 5\' 6"  (1.676 m), weight 234 lb (106.1 kg), SpO2 99 %.  Physical Exam Physical Exam  Constitutional: She is oriented to person, place, and time. She appears well-developed and well-nourished.  HENT:  Mouth/Throat: Oropharynx is clear and moist.  Eyes: Conjunctivae are normal. No scleral icterus.  Neck: Neck supple.  Cardiovascular: Normal rate, regular rhythm and normal heart sounds.  Pulmonary/Chest: Effort normal and breath sounds normal.  Abdominal: Soft. Bowel sounds are normal. There is tenderness (mild, no rebound or guarding) in the right upper quadrant.  Lymphadenopathy:    She has no cervical adenopathy.  Neurological: She is alert and oriented to person, place, and time.  Skin: Skin is warm and dry.  Psychiatric: Her behavior is normal.    Data Reviewed Progress notes and ultrasound  Ultrasound showed tiny amount of sludge but no stones.  Common bile duct was measured at 7.8 cm which is abnormal Liver function showed mild elevation in enzymes but bilirubin is normal and her white count is normal assessment    Likely biliary colic and the patient may have passed a stone accounting for the mild dilatation of her common bile duct.  Her pain is improved notably since she first went to the emergency room this morning.  An MRCP may be of benefit to see if she still has a stone in the duct however the patient had a syncopal episode attempted MRI in the past. Not having as much pain now she plan to leave things alone and reassess if the pain becomes worse. Patient was advised in full.  Likely  will repeat liver functions morning    Plan    Recommend MRCP, patient unable to tolerate due to claustrophobia.  Patient wishes to observe for now and call for further complications. Call MD with update in the morning.     HPI, Physical Exam, Assessment and Plan have been scribed under the direction and in the presence of Mckinley Jewel, MD Karie Fetch, RN I have completed the exam and reviewed the above documentation for accuracy and completeness.  I agree with the above.  Haematologist has been used and any errors in dictation or transcription are unintentional.  Dempsey Ahonen G. Jamal Collin, M.D., F.A.C.S.   Junie Panning G 11/13/2017, 10:10 AM

## 2017-11-12 NOTE — ED Notes (Signed)
Pt to the er for increasing pain in the abdomen and chest radiating around to the right side and into her back. Pt reports nausea. No sob, no vomiting, no dizziness. Pt still has her gallbladder. Pt describes pain as sharp and constant and unable to alleviate pain at home. Pt unable to report anything that makes it worse or better.

## 2017-11-12 NOTE — Patient Instructions (Signed)
The patient is aware to call back for any questions or concerns.  

## 2017-11-12 NOTE — ED Triage Notes (Signed)
Pt to the ER for chest pain/abd pain since 0100.

## 2017-11-12 NOTE — Discharge Instructions (Signed)
Please seek medical attention for any high fevers, chest pain, shortness of breath, change in behavior, persistent vomiting, bloody stool or any other new or concerning symptoms.  

## 2017-11-12 NOTE — ED Provider Notes (Signed)
[place] not suggestive of portal vein thrombus. Did have gallbladder sludge and some CBD dilation. Discussed these findings with patient's surgeon Dr. Jamal Collin. He would like to evaluate her in his clinic this morning. Patient's pain is now 2/10. She feels comfortable with plan to follow up with surgeon today.   Nance Pear, MD 11/12/17 [phone]

## 2017-11-12 NOTE — ED Notes (Signed)
Patient transported to US at this time.  

## 2017-11-13 ENCOUNTER — Other Ambulatory Visit
Admission: RE | Admit: 2017-11-13 | Discharge: 2017-11-13 | Disposition: A | Discharge status: Home / Self Care | Payer: 59 | Source: Ambulatory Visit | Attending: General Surgery | Admitting: General Surgery

## 2017-11-13 DIAGNOSIS — R1011 Right upper quadrant pain: Secondary | ICD-10-CM | POA: Insufficient documentation

## 2017-11-13 LAB — HEPATIC FUNCTION PANEL
ALT: 64 U/L — ABNORMAL HIGH (ref 14–54)
AST: 48 U/L — AB (ref 15–41)
Albumin: 3.6 g/dL (ref 3.5–5.0)
Alkaline Phosphatase: 115 U/L (ref 38–126)
BILIRUBIN TOTAL: 0.6 mg/dL (ref 0.3–1.2)
Total Protein: 6.9 g/dL (ref 6.5–8.1)

## 2017-11-13 LAB — LIPASE, BLOOD: Lipase: 17 U/L (ref 11–51)

## 2017-11-16 ENCOUNTER — Ambulatory Visit: Payer: 59 | Admitting: Physical Therapy

## 2017-11-19 ENCOUNTER — Ambulatory Visit: Payer: 59 | Admitting: Physical Therapy

## 2018-02-08 ENCOUNTER — Encounter: Payer: Self-pay | Admitting: Family Medicine

## 2018-02-08 ENCOUNTER — Ambulatory Visit: Payer: Self-pay | Admitting: Family Medicine

## 2018-02-08 VITALS — BP 117/58 | HR 78 | Temp 98.1°F | Wt 226.0 lb

## 2018-02-08 DIAGNOSIS — S46912A Strain of unspecified muscle, fascia and tendon at shoulder and upper arm level, left arm, initial encounter: Secondary | ICD-10-CM

## 2018-02-08 DIAGNOSIS — Z853 Personal history of malignant neoplasm of breast: Secondary | ICD-10-CM

## 2018-02-08 MED ORDER — CYCLOBENZAPRINE HCL 10 MG PO TABS: 10.0000 mg | ORAL_TABLET | Freq: Every day | ORAL | 0 refills | Status: AC

## 2018-02-08 NOTE — Patient Instructions (Addendum)
PLAN< Take prescribed muscle relaxant nightly as discussed Continue your previously prescribed Gabapentin from PCP for neuropathic pain symptoms Follow Up with Oncology or Ortho Surgeon for evaluation and treatment of this exacerbation of left sided shoulder pain due to prior history of cancer and radiation treatment on this same side.  Shoulder Pain Many things can cause shoulder pain, including:  An injury.  Moving the arm in the same way again and again (overuse).  Joint pain (arthritis).  Follow these instructions at home: Take these actions to help with your pain:  Squeeze a soft ball or a foam pad as much as you can. This helps to prevent swelling. It also makes the arm stronger.  Take over-the-counter and prescription medicines only as told by your doctor.  If told, put ice on the area: ? Put ice in a plastic bag. ? Place a towel between your skin and the bag. ? Leave the ice on for 20 minutes, 2-3 times per day. Stop putting on ice if it does not help with the pain.  If you were given a shoulder sling or immobilizer: ? Wear it as told. ? Remove it to shower or bathe. ? Move your arm as little as possible. ? Keep your hand moving. This helps prevent swelling.  Contact a doctor if:  Your pain gets worse.  Medicine does not help your pain.  You have new pain in your arm, hand, or fingers. Get help right away if:  Your arm, hand, or fingers: ? Tingle. ? Are numb. ? Are swollen. ? Are painful. ? Turn white or blue. This information is not intended to replace advice given to you by your health care provider. Make sure you discuss any questions you have with your health care provider. Document Released: 04/28/2008 Document Revised: 07/06/2016 Document Reviewed: 03/05/2015 Elsevier Interactive Patient Education  [employer].

## 2018-02-08 NOTE — Progress Notes (Signed)
Mandy Roberts is a 57 y.o. female who presents with complaint of left sided shoulder pain. She has a relevant history of breast cancer and radiation to this same side as well as history of gastric surgery approx 3 years ago. She reports attempting to self treat this condition the last few days with motrin with little relief.   Review of Systems  Constitutional: Negative.   HENT: Negative.   Eyes: Negative.   Respiratory: Negative.   Cardiovascular: Negative.   Gastrointestinal: Negative.   Genitourinary: Negative.   Musculoskeletal:       Left shoulder pain  Skin: Negative.   Neurological: Negative.   Endo/Heme/Allergies: Negative.   Psychiatric/Behavioral: Negative.      O: Vitals:   02/08/18 1508  BP: (!) 117/58  Pulse: 78  Temp: 98.1 F (36.7 C)  SpO2: 97%   Physical Exam  Constitutional: She is oriented to person, place, and time. She appears well-developed and well-nourished.  HENT:  Head: Normocephalic.  Eyes: Pupils are equal, round, and reactive to light.  Neck: Normal range of motion. Neck supple.  Cardiovascular: Normal rate.  Pulmonary/Chest: Effort normal and breath sounds normal.  Abdominal: Soft. Bowel sounds are normal.  Musculoskeletal: She exhibits tenderness.       Left shoulder: She exhibits decreased range of motion, tenderness, pain and decreased strength.  ROM to left shoulder severely decreased with pain with passive and active movement- No evidence of swelling on exam  Neurological: She is alert and oriented to person, place, and time.  Skin: Skin is warm.     A: 1. Strain of left shoulder, initial encounter   2. Hx of breast cancer    P:  Take prescribed muscle relaxant nightly as discussed Continue your previously prescribed Gabapentin from PCP for neuropathic pain symptoms  Advised f/u with Oncology or Ortho Surgeon for evaluation and treatment of this exacerbation of left sided shoulder pain due to prior history of cancer and  radiation treatment on this same side.  1. Strain of left shoulder, initial encounter - cyclobenzaprine (FLEXERIL) 10 MG tablet; Take 1 tablet (10 mg total) by mouth at bedtime for 15 days.  2. Hx of breast cancer - cyclobenzaprine (FLEXERIL) 10 MG tablet; Take 1 tablet (10 mg total) by mouth at bedtime for 15 days.

## 2018-02-11 ENCOUNTER — Telehealth: Payer: Self-pay | Admitting: Emergency Medicine

## 2018-02-11 NOTE — Telephone Encounter (Signed)
Follow-up call from visit with [facility]. Spoke with patient still in pain flexeril helps only at night. Had appointment with [facility] today gave her injection. Will continue follow up with [facility].

## 2018-05-19 ENCOUNTER — Ambulatory Visit: Payer: Self-pay | Admitting: Family Medicine

## 2018-05-19 ENCOUNTER — Encounter: Payer: Self-pay | Admitting: Family Medicine

## 2018-05-19 VITALS — BP 120/80 | HR 68 | Temp 98.2°F | Ht 64.0 in | Wt 215.0 lb

## 2018-05-19 DIAGNOSIS — Z0000 Encounter for general adult medical examination without abnormal findings: Secondary | ICD-10-CM

## 2018-05-19 DIAGNOSIS — Z853 Personal history of malignant neoplasm of breast: Secondary | ICD-10-CM

## 2018-05-19 DIAGNOSIS — I4891 Unspecified atrial fibrillation: Secondary | ICD-10-CM

## 2018-05-19 LAB — POCT URINALYSIS DIPSTICK
BILIRUBIN UA: NEGATIVE
GLUCOSE UA: NEGATIVE
Ketones, UA: NEGATIVE
Leukocytes, UA: NEGATIVE
Nitrite, UA: NEGATIVE
PH UA: 5.5 (ref 5.0–8.0)
Protein, UA: NEGATIVE
RBC UA: NEGATIVE
Spec Grav, UA: >=1.03 — AB (ref 1.010–1.025)
UROBILINOGEN UA: 0.2 U/dL

## 2018-05-19 NOTE — Patient Instructions (Addendum)
We have scheduled you an appointment with [facility], [phone]. Appointment 06/16/2018 at 9:20 am to establish care. Please follow-up to have all of your chronic conditions addressed.     Encouraged efforts to reduce weight include engaging in physical activity as tolerated with goal of 150 minutes per week. Improve dietary choices and eat a meal regimen consistent with a Mediterranean or DASH diet. Reduce simple carbohydrates. Do not skip meals and eat healthy snacks throughout the day to avoid over-eating at dinner. Set a goal weight loss that is achievable for you. Follow-up with PCP to obtain routine screening labs.    DASH Eating Plan DASH stands for "Dietary Approaches to Stop Hypertension." The DASH eating plan is a healthy eating plan that has been shown to reduce high blood pressure (hypertension). It may also reduce your risk for type 2 diabetes, heart disease, and stroke. The DASH eating plan may also help with weight loss. What are tips for following this plan? General guidelines  Avoid eating more than 2,300 mg (milligrams) of salt (sodium) a day. If you have hypertension, you may need to reduce your sodium intake to 1,500 mg a day.  Limit alcohol intake to no more than 1 drink a day for nonpregnant women and 2 drinks a day for men. One drink equals 12 oz of beer, 5 oz of wine, or 1 oz of hard liquor.  Work with your health care provider to maintain a healthy body weight or to lose weight. Ask what an ideal weight is for you.  Get at least 30 minutes of exercise that causes your heart to beat faster (aerobic exercise) most days of the week. Activities may include walking, swimming, or biking.  Work with your health care provider or diet and nutrition specialist (dietitian) to adjust your eating plan to your individual calorie needs. Reading food labels  Check food labels for the amount of sodium per serving. Choose foods with less than 5 percent of the Daily  Value of sodium. Generally, foods with less than 300 mg of sodium per serving fit into this eating plan.  To find whole grains, look for the word "whole" as the first word in the ingredient list. Shopping  Buy products labeled as "low-sodium" or "no salt added."  Buy fresh foods. Avoid canned foods and premade or frozen meals. Cooking  Avoid adding salt when cooking. Use salt-free seasonings or herbs instead of table salt or sea salt. Check with your health care provider or pharmacist before using salt substitutes.  Do not fry foods. Cook foods using healthy methods such as baking, boiling, grilling, and broiling instead.  Cook with heart-healthy oils, such as olive, canola, soybean, or sunflower oil. Meal planning   Eat a balanced diet that includes: ? 5 or more servings of fruits and vegetables each day. At each meal, try to fill half of your plate with fruits and vegetables. ? Up to 6-8 servings of whole grains each day. ? Less than 6 oz of lean meat, poultry, or fish each day. A 3-oz serving of meat is about the same size as a deck of cards. One egg equals 1 oz. ? 2 servings of low-fat dairy each day. ? A serving of nuts, seeds, or beans 5 times each week. ? Heart-healthy fats. Healthy fats called Omega-3 fatty acids are found in foods such as flaxseeds and coldwater fish, like sardines, salmon, and mackerel.  Limit how much you eat of the following: ? Canned or prepackaged foods. ?  Food that is high in trans fat, such as fried foods. ? Food that is high in saturated fat, such as fatty meat. ? Sweets, desserts, sugary drinks, and other foods with added sugar. ? Full-fat dairy products.  Do not salt foods before eating.  Try to eat at least 2 vegetarian meals each week.  Eat more home-cooked food and less restaurant, buffet, and fast food.  When eating at a restaurant, ask that your food be prepared with less salt or no salt, if possible. What foods are recommended? The  items listed may not be a complete list. Talk with your dietitian about what dietary choices are best for you. Grains Whole-grain or whole-wheat bread. Whole-grain or whole-wheat pasta. Brown rice. [place]. Bulgur. Whole-grain and low-sodium cereals. Pita bread. Low-fat, low-sodium crackers. Whole-wheat flour tortillas. Vegetables Fresh or frozen vegetables (raw, steamed, roasted, or grilled). Low-sodium or reduced-sodium tomato and vegetable juice. Low-sodium or reduced-sodium tomato sauce and tomato paste. Low-sodium or reduced-sodium canned vegetables. Fruits All fresh, dried, or frozen fruit. Canned fruit in natural juice (without added sugar). Meat and other protein foods Skinless chicken or [place]. Ground chicken or [place]. Pork with fat trimmed off. Fish and seafood. Egg whites. Dried beans, peas, or lentils. Unsalted nuts, nut butters, and seeds. Unsalted canned beans. Lean cuts of beef with fat trimmed off. Low-sodium, lean deli meat. Dairy Low-fat (1%) or fat-free (skim) milk. Fat-free, low-fat, or reduced-fat cheeses. Nonfat, low-sodium ricotta or cottage cheese. Low-fat or nonfat yogurt. Low-fat, low-sodium cheese. Fats and oils Soft margarine without trans fats. Vegetable oil. Low-fat, reduced-fat, or light mayonnaise and salad dressings (reduced-sodium). Canola, safflower, olive, soybean, and sunflower oils. Avocado. Seasoning and other foods Herbs. Spices. Seasoning mixes without salt. Unsalted popcorn and pretzels. Fat-free sweets. What foods are not recommended? The items listed may not be a complete list. Talk with your dietitian about what dietary choices are best for you. Grains Baked goods made with fat, such as croissants, muffins, or some breads. Dry pasta or rice meal packs. Vegetables Creamed or fried vegetables. Vegetables in a cheese sauce. Regular canned vegetables (not low-sodium or reduced-sodium). Regular canned tomato sauce and paste (not low-sodium or  reduced-sodium). Regular tomato and vegetable juice (not low-sodium or reduced-sodium). Angie Fava. Olives. Fruits Canned fruit in a light or heavy syrup. Fried fruit. Fruit in cream or butter sauce. Meat and other protein foods Fatty cuts of meat. Ribs. Fried meat. Berniece Salines. Sausage. Bologna and other processed lunch meats. Salami. Fatback. Hotdogs. Bratwurst. Salted nuts and seeds. Canned beans with added salt. Canned or smoked fish. Whole eggs or egg yolks. Chicken or [place] with skin. Dairy Whole or 2% milk, cream, and half-and-half. Whole or full-fat cream cheese. Whole-fat or sweetened yogurt. Full-fat cheese. Nondairy creamers. Whipped toppings. Processed cheese and cheese spreads. Fats and oils Butter. Stick margarine. Lard. Shortening. Ghee. Bacon fat. Tropical oils, such as coconut, palm kernel, or palm oil. Seasoning and other foods Salted popcorn and pretzels. Onion salt, garlic salt, seasoned salt, table salt, and sea salt. Worcestershire sauce. Tartar sauce. Barbecue sauce. Teriyaki sauce. Soy sauce, including reduced-sodium. Steak sauce. Canned and packaged gravies. Fish sauce. Oyster sauce. Cocktail sauce. Horseradish that you find on the shelf. Ketchup. Mustard. Meat flavorings and tenderizers. Bouillon cubes. Hot sauce and Tabasco sauce. Premade or packaged marinades. Premade or packaged taco seasonings. Relishes. Regular salad dressings. Where to find more information:  National Heart, Lung, and [place]: https://wilson-eaton.com/  American Heart Association: www.heart.org Summary  The DASH eating plan is  a healthy eating plan that has been shown to reduce high blood pressure (hypertension). It may also reduce your risk for type 2 diabetes, heart disease, and stroke.  With the DASH eating plan, you should limit salt (sodium) intake to 2,300 mg a day. If you have hypertension, you may need to reduce your sodium intake to 1,500 mg a day.  When on the DASH eating plan, aim to eat more  fresh fruits and vegetables, whole grains, lean proteins, low-fat dairy, and heart-healthy fats.  Work with your health care provider or diet and nutrition specialist (dietitian) to adjust your eating plan to your individual calorie needs. This information is not intended to replace advice given to you by your health care provider. Make sure you discuss any questions you have with your health care provider. Document Released: 10/30/2011 Document Revised: 11/03/2016 Document Reviewed: 11/03/2016 Elsevier Interactive Patient Education  2018 [place] Maintenance, Female Adopting a healthy lifestyle and getting preventive care can go a long way to promote health and wellness. Talk with your health care provider about what schedule of regular examinations is right for you. This is a good chance for you to check in with your provider about disease prevention and staying healthy. In between checkups, there are plenty of things you can do on your own. Experts have done a lot of research about which lifestyle changes and preventive measures are most likely to keep you healthy. Ask your health care provider for more information. Weight and diet Eat a healthy diet  Be sure to include plenty of vegetables, fruits, low-fat dairy products, and lean protein.  Do not eat a lot of foods high in solid fats, added sugars, or salt.  Get regular exercise. This is one of the most important things you can do for your health. ? Most adults should exercise for at least 150 minutes each week. The exercise should increase your heart rate and make you sweat (moderate-intensity exercise). ? Most adults should also do strengthening exercises at least twice a week. This is in addition to the moderate-intensity exercise.  Maintain a healthy weight  Body mass index (BMI) is a measurement that can be used to identify possible weight problems. It estimates body fat based on height and weight. Your health care  provider can help determine your BMI and help you achieve or maintain a healthy weight.  For females 43 years of age and older: ? A BMI below 18.5 is considered underweight. ? A BMI of 18.5 to 24.9 is normal. ? A BMI of 25 to 29.9 is considered overweight. ? A BMI of 30 and above is considered obese.  Watch levels of cholesterol and blood lipids  You should start having your blood tested for lipids and cholesterol at 57 years of age, then have this test every 5 years.  You may need to have your cholesterol levels checked more often if: ? Your lipid or cholesterol levels are high. ? You are older than 57 years of age. ? You are at high risk for heart disease.  Cancer screening Lung Cancer  Lung cancer screening is recommended for adults 44-101 years old who are at high risk for lung cancer because of a history of smoking.  A yearly low-dose CT scan of the lungs is recommended for people who: ? Currently smoke. ? Have quit within the past 15 years. ? Have at least a 30-pack-year history of smoking. A pack year is smoking an average of one pack  of cigarettes a day for 1 year.  Yearly screening should continue until it has been 15 years since you quit.  Yearly screening should stop if you develop a health problem that would prevent you from having lung cancer treatment.  Breast Cancer  Practice breast self-awareness. This means understanding how your breasts normally appear and feel.  It also means doing regular breast self-exams. Let your health care provider know about any changes, no matter how small.  If you are in your 20s or 30s, you should have a clinical breast exam (CBE) by a health care provider every 1-3 years as part of a regular health exam.  If you are 37 or older, have a CBE every year. Also consider having a breast X-ray (mammogram) every year.  If you have a family history of breast cancer, talk to your health care provider about genetic screening.  If you are  at high risk for breast cancer, talk to your health care provider about having an MRI and a mammogram every year.  Breast cancer gene (BRCA) assessment is recommended for women who have family members with BRCA-related cancers. BRCA-related cancers include: ? Breast. ? Ovarian. ? Tubal. ? Peritoneal cancers.  Results of the assessment will determine the need for genetic counseling and BRCA1 and BRCA2 testing.  Cervical Cancer Your health care provider may recommend that you be screened regularly for cancer of the pelvic organs (ovaries, uterus, and vagina). This screening involves a pelvic examination, including checking for microscopic changes to the surface of your cervix (Pap test). You may be encouraged to have this screening done every 3 years, beginning at age 51.  For women ages 11-65, health care providers may recommend pelvic exams and Pap testing every 3 years, or they may recommend the Pap and pelvic exam, combined with testing for human papilloma virus (HPV), every 5 years. Some types of HPV increase your risk of cervical cancer. Testing for HPV may also be done on women of any age with unclear Pap test results.  Other health care providers may not recommend any screening for nonpregnant women who are considered low risk for pelvic cancer and who do not have symptoms. Ask your health care provider if a screening pelvic exam is right for you.  If you have had past treatment for cervical cancer or a condition that could lead to cancer, you need Pap tests and screening for cancer for at least 20 years after your treatment. If Pap tests have been discontinued, your risk factors (such as having a new sexual partner) need to be reassessed to determine if screening should resume. Some women have medical problems that increase the chance of getting cervical cancer. In these cases, your health care provider may recommend more frequent screening and Pap tests.  Colorectal Cancer  This type of  cancer can be detected and often prevented.  Routine colorectal cancer screening usually begins at 57 years of age and continues through 57 years of age.  Your health care provider may recommend screening at an earlier age if you have risk factors for colon cancer.  Your health care provider may also recommend using home test kits to check for hidden blood in the stool.  A small camera at the end of a tube can be used to examine your colon directly (sigmoidoscopy or colonoscopy). This is done to check for the earliest forms of colorectal cancer.  Routine screening usually begins at age 70.  Direct examination of the colon should be repeated  every 5-10 years through 57 years of age. However, you may need to be screened more often if early forms of precancerous polyps or small growths are found.  Skin Cancer  Check your skin from head to toe regularly.  Tell your health care provider about any new moles or changes in moles, especially if there is a change in a mole's shape or color.  Also tell your health care provider if you have a mole that is larger than the size of a pencil eraser.  Always use sunscreen. Apply sunscreen liberally and repeatedly throughout the day.  Protect yourself by wearing long sleeves, pants, a wide-brimmed hat, and sunglasses whenever you are outside.  Heart disease, diabetes, and high blood pressure  High blood pressure causes heart disease and increases the risk of stroke. High blood pressure is more likely to develop in: ? People who have blood pressure in the high end of the normal range (130-139/85-89 mm Hg). ? People who are overweight or obese. ? People who are African American.  If you are 53-62 years of age, have your blood pressure checked every 3-5 years. If you are 53 years of age or older, have your blood pressure checked every year. You should have your blood pressure measured twice-once when you are at a hospital or clinic, and once when you are  not at a hospital or clinic. Record the average of the two measurements. To check your blood pressure when you are not at a hospital or clinic, you can use: ? An automated blood pressure machine at a pharmacy. ? A home blood pressure monitor.  If you are between 62 years and 33 years old, ask your health care provider if you should take aspirin to prevent strokes.  Have regular diabetes screenings. This involves taking a blood sample to check your fasting blood sugar level. ? If you are at a normal weight and have a low risk for diabetes, have this test once every three years after 57 years of age. ? If you are overweight and have a high risk for diabetes, consider being tested at a younger age or more often. Preventing infection Hepatitis B  If you have a higher risk for hepatitis B, you should be screened for this virus. You are considered at high risk for hepatitis B if: ? You were born in a country where hepatitis B is common. Ask your health care provider which countries are considered high risk. ? Your parents were born in a high-risk country, and you have not been immunized against hepatitis B (hepatitis B vaccine). ? You have HIV or AIDS. ? You use needles to inject street drugs. ? You live with someone who has hepatitis B. ? You have had sex with someone who has hepatitis B. ? You get hemodialysis treatment. ? You take certain medicines for conditions, including cancer, organ transplantation, and autoimmune conditions.  Hepatitis C  Blood testing is recommended for: ? Everyone born from [birth year] through 1965. ? Anyone with known risk factors for hepatitis C.  Sexually transmitted infections (STIs)  You should be screened for sexually transmitted infections (STIs) including gonorrhea and chlamydia if: ? You are sexually active and are younger than 57 years of age. ? You are older than 57 years of age and your health care provider tells you that you are at risk for this type of  infection. ? Your sexual activity has changed since you were last screened and you are at an increased risk for chlamydia  or gonorrhea. Ask your health care provider if you are at risk.  If you do not have HIV, but are at risk, it may be recommended that you take a prescription medicine daily to prevent HIV infection. This is called pre-exposure prophylaxis (PrEP). You are considered at risk if: ? You are sexually active and do not regularly use condoms or know the HIV status of your partner(s). ? You take drugs by injection. ? You are sexually active with a partner who has HIV.  Talk with your health care provider about whether you are at high risk of being infected with HIV. If you choose to begin PrEP, you should first be tested for HIV. You should then be tested every 3 months for as long as you are taking PrEP. Pregnancy  If you are premenopausal and you may become pregnant, ask your health care provider about preconception counseling.  If you may become pregnant, take 400 to 800 micrograms (mcg) of folic acid every day.  If you want to prevent pregnancy, talk to your health care provider about birth control (contraception). Osteoporosis and menopause  Osteoporosis is a disease in which the bones lose minerals and strength with aging. This can result in serious bone fractures. Your risk for osteoporosis can be identified using a bone density scan.  If you are 62 years of age or older, or if you are at risk for osteoporosis and fractures, ask your health care provider if you should be screened.  Ask your health care provider whether you should take a calcium or vitamin D supplement to lower your risk for osteoporosis.  Menopause may have certain physical symptoms and risks.  Hormone replacement therapy may reduce some of these symptoms and risks. Talk to your health care provider about whether hormone replacement therapy is right for you. Follow these instructions at home:  Schedule  regular health, dental, and eye exams.  Stay current with your immunizations.  Do not use any tobacco products including cigarettes, chewing tobacco, or electronic cigarettes.  If you are pregnant, do not drink alcohol.  If you are breastfeeding, limit how much and how often you drink alcohol.  Limit alcohol intake to no more than 1 drink per day for nonpregnant women. One drink equals 12 ounces of beer, 5 ounces of wine, or 1 ounces of hard liquor.  Do not use street drugs.  Do not share needles.  Ask your health care provider for help if you need support or information about quitting drugs.  Tell your health care provider if you often feel depressed.  Tell your health care provider if you have ever been abused or do not feel safe at home. This information is not intended to replace advice given to you by your health care provider. Make sure you discuss any questions you have with your health care provider. Document Released: 05/26/2011 Document Revised: 04/17/2016 Document Reviewed: 08/14/2015 Elsevier Interactive Patient Education  [employer].

## 2018-05-19 NOTE — Progress Notes (Signed)
Patient ID: Mandy Roberts, female    DOB: 1961-03-06, 57 y.o.   MRN: [id number]  PCP: Perrin Maltese, MD  Chief Complaint  Patient presents with  . Wellness Exam    Subjective:  HPI Mandy Roberts is a 57 y.o. female presents for wellness visit in order to satisfy employee sponsored health insurance benefits. No recent follow-up with a primary care provider. She has a medical history significant for breast cancer, hypertension, obesity, atrial fibrillation, and OSA. She is not followed by cardiology, however reports her medications are being refilled by providers in the emergency department. She took herself off of her blood pressure medication due to multiple incidences of hypotension. She denies any recent episodes of chest pain, shortness of breath, dizziness, syncope, or palpitations. She is chronically taking Eliquis which was prescribed in the ED. She denies any missed doses of medication or bruising/bleeding. She is inactive of exercise. S/P recent left knee replacement. Has a history of breast cancer involving lymph nodes which was treated with both chemotherapy and radiation. Reports routine mammograms which have remained unremarkable for the last several years. She is inactive of routine physical exercise. Admits to a diet rich in sodium including added table salt.   Family History: Mother deceased: Pulmonary hypertension, Chronic Kidney Disease, Diabetes, and Congestive Heart Failure Father: Dementia   Social History   Socioeconomic History  . Marital status: Married    Spouse name: Not on file  . Number of children: Not on file  . Years of education: Not on file  . Highest education level: Not on file  Occupational History  . Not on file  Social Needs  . Financial resource strain: Not on file  . Food insecurity:    Worry: Not on file    Inability: Not on file  . Transportation needs:    Medical: Not on file    Non-medical: Not on file  Tobacco Use  . Smoking status:  Never Smoker  . Smokeless tobacco: Never Used  Substance and Sexual Activity  . Alcohol use: No  . Drug use: No  . Sexual activity: Not on file  Lifestyle  . Physical activity:    Days per week: Not on file    Minutes per session: Not on file  . Stress: Not on file  Relationships  . Social connections:    Talks on phone: Not on file    Gets together: Not on file    Attends religious service: Not on file    Active member of club or organization: Not on file    Attends meetings of clubs or organizations: Not on file    Relationship status: Not on file  . Intimate partner violence:    Fear of current or ex partner: Not on file    Emotionally abused: Not on file    Physically abused: Not on file    Forced sexual activity: Not on file  Other Topics Concern  . Not on file  Social History Narrative  . Not on file    Family History  Problem Relation Age of Onset  . Heart failure Mother   . Hypertension Mother   . Hyperlipidemia Mother   . Kidney disease Mother   . CVA Mother   . Hypothyroidism Mother   . Breast cancer Maternal Aunt 76     Review of Systems  Patient Active Problem List   Diagnosis Date Noted  . 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 Difficult airway for intubation  04/09/2015  . Chronic nasal congestion 04/09/2015  . Atrial fibrillation ([place]) 09/16/2013  . Obstructive sleep apnea 09/16/2013  . Morbid obesity ([place]) 09/16/2013  . Personal history of breast cancer 02/09/2013  . Hypertension   . Cancer (HCC)     Allergies  Allergen Reactions  . Tdap [Tetanus-Diphth-Acell Pertussis] Shortness Of Breath  . Penicillins Rash    Has patient had a PCN reaction causing immediate rash, facial/tongue/throat swelling, SOB or lightheadedness with hypotension: Yes Has patient had a PCN reaction causing severe rash involving mucus membranes or skin necrosis: No Has patient had a PCN reaction that required hospitalization: No Has patient had a PCN reaction  occurring within the last 10 years: No If all of the above answers are "NO", then may proceed with Cephalosporin use.     Prior to Admission medications   Medication Sig Start Date End Date Taking? Authorizing Provider  apixaban (ELIQUIS) 5 MG TABS tablet Take 5 mg by mouth 2 (two) times daily.   Yes [provider]  Multiple Vitamin (MULTIVITAMIN WITH MINERALS) TABS tablet Take 1 tablet by mouth daily.   Yes [provider]  sotalol (BETAPACE) 80 MG tablet Take 80 mg by mouth 2 (two) times daily.   Yes [provider]  traMADol (ULTRAM) 50 MG tablet Take 1-2 tablets (50-100 mg total) by mouth every 4 (four) hours as needed for moderate pain. 09/08/17  Yes Watt Climes, PA  losartan-hydrochlorothiazide (HYZAAR) 50-12.5 MG tablet Take 1 tablet by mouth daily.    [provider]    Past Medical, Surgical Family and Social History reviewed and updated.    Objective:   Today's Vitals   05/19/18 1000  BP: 120/80  Pulse: 68  Temp: 98.2 F (36.8 C)  SpO2: 98%  Weight: 215 lb (97.5 kg)  Height: 5\' 4"  (1.626 m)    Wt Readings from Last 3 Encounters:  05/19/18 215 lb (97.5 kg)  02/08/18 226 lb (102.5 kg)  11/12/17 234 lb (106.1 kg)    Physical Exam Constitutional: Patient appears well-developed and well-nourished. No distress. HENT: Normocephalic, atraumatic, External right and left ear normal. Oropharynx is clear and moist.  Eyes: Conjunctivae and EOM are normal. PERRLA, no scleral icterus. Neck: Normal ROM. Neck supple. No JVD. No tracheal deviation. No thyromegaly. CVS: RRR, S1/S2 +, no murmurs, no gallops, no carotid bruit.  Pulmonary: Effort and breath sounds normal, no stridor, rhonchi, wheezes, rales.  Abdominal: Soft. BS +, no distension, tenderness, rebound or guarding.  Musculoskeletal: Normal range of motion. Bilateral + 2 pitting edema and no tenderness.  Lymphadenopathy: No lymphadenopathy noted, cervical or supraclavicular  adenopathy noted. Neuro: Alert. Normal reflexes, muscle tone coordination. No cranial nerve deficit. Skin: Skin is warm and dry. No rash noted. Not diaphoretic. No erythema. No pallor. Psychiatric: Normal mood and affect. Behavior, judgment, thought content normal.  Assessment & Plan:  1. Wellness examination 2. Atrial fibrillation, unspecified type ([place]) 3. Hx of breast cancer  Patient presents today with multiple chronic health conditions for which are not currently being managed by a PCP or cardiology. She last had labs completed in the ED back 10/2017, which were significant for abnormal liver enzymes and mildly elevated glucose. Today she is stable and asymptomatic, however I advised patient given her extensive cardiac family history and her history of atrial fibrillation, I was able to secure a new patient appointment for this patient with [facility] for July.  Patient verbalized understanding.   Age-appropriate anticipatory guidance provided  Encouraged  efforts to reduce weight include engaging in physical activity as tolerated with goal of 150 minutes per week. Improve dietary choices and eat a meal regimen consistent with a Mediterranean or DASH diet. Reduce simple carbohydrates. Do not skip meals and eat healthy snacks throughout the day to avoid over-eating at dinner. Set a goal weight loss that is achievable for you. .  If symptoms worsen or do not improve, return for follow-up, follow-up with PCP, or at the emergency department if severity of symptoms warrant a higher level of care.    Carroll Sage. Kenton Kingfisher, MSN, FNP-C [facility]  [address] [place], [place] [address] [phone]

## 2018-06-16 ENCOUNTER — Ambulatory Visit: Payer: Self-pay | Admitting: Family Medicine

## 2018-07-21 DIAGNOSIS — M25562 Pain in left knee: Secondary | ICD-10-CM | POA: Diagnosis not present

## 2018-07-21 DIAGNOSIS — Z96652 Presence of left artificial knee joint: Secondary | ICD-10-CM | POA: Diagnosis not present

## 2018-08-11 DIAGNOSIS — C50912 Malignant neoplasm of unspecified site of left female breast: Secondary | ICD-10-CM | POA: Diagnosis not present

## 2018-08-20 ENCOUNTER — Other Ambulatory Visit: Payer: Self-pay

## 2018-08-20 DIAGNOSIS — Z1231 Encounter for screening mammogram for malignant neoplasm of breast: Secondary | ICD-10-CM

## 2018-08-20 NOTE — Progress Notes (Signed)
.  scr

## 2018-10-25 ENCOUNTER — Telehealth: Payer: Self-pay | Admitting: General Surgery

## 2018-10-25 NOTE — Telephone Encounter (Signed)
Patient has called and wanted to inform the office that she forgot to have her mammogram that was scheduled on 10/15/18. I have informed her to call [facility] to reschedule her mammogram and then to call our office back to reschedule her appointment with Dr Bary Castilla for a follow up. The number to [facility] was given to the [phone]. Patient will contact us as soon as she is able to obtain a date for her mammogram.

## 2018-10-26 ENCOUNTER — Ambulatory Visit: Payer: 59 | Admitting: General Surgery

## 2018-11-09 ENCOUNTER — Ambulatory Visit
Admission: RE | Admit: 2018-11-09 | Discharge: 2018-11-09 | Disposition: A | Discharge status: Home / Self Care | Payer: 59 | Source: Ambulatory Visit | Attending: General Surgery | Admitting: General Surgery

## 2018-11-09 DIAGNOSIS — Z1231 Encounter for screening mammogram for malignant neoplasm of breast: Secondary | ICD-10-CM | POA: Insufficient documentation

## 2018-11-11 ENCOUNTER — Encounter: Payer: Self-pay | Admitting: General Surgery

## 2018-11-11 ENCOUNTER — Ambulatory Visit (INDEPENDENT_AMBULATORY_CARE_PROVIDER_SITE_OTHER): Payer: 59 | Admitting: General Surgery

## 2018-11-11 ENCOUNTER — Other Ambulatory Visit: Payer: Self-pay

## 2018-11-11 VITALS — BP 160/80 | HR 70 | Temp 97.9°F | Resp 17 | Ht 66.0 in | Wt 215.0 lb

## 2018-11-11 DIAGNOSIS — Z853 Personal history of malignant neoplasm of breast: Secondary | ICD-10-CM | POA: Diagnosis not present

## 2018-11-11 NOTE — Progress Notes (Addendum)
Patient ID: Mandy Roberts, female   DOB: 05/04/1961, 57 y.o.   MRN: [id number]  Chief Complaint  Patient presents with  . Follow-up     one year f/u recall uni right screening mammo [facility] pt r/s mammo to 11/09/18    HPI Mandy Roberts is a 57 y.o. female here today to follow up for 1 year mammogram. Patient states she is feeling well.  HPI  Past Medical History:  Diagnosis Date  . Atrial fibrillation ([place])   . Breast cancer ([place]) 1990/1997    left mastectomy ,had recurrence in left suprclavic nodes several yrs ago-treated with radiation and chemo  . Colon polyp 2008  . Complication of anesthesia   . Diabetes mellitus without complication (HCC)    history of, loss of 105 lbs  . Difficult intubation    expectorated blood 3-4 days after surgery in 2011  . Difficult intubation    hypoxia with endoscopy in 2016  . History of chemotherapy    5-FU/AC in 1990 under the care of Dr. Oliva Bustard. pt also tx with tamoxifen and arimidex  . History of radiation therapy    radiation treat at [facility]  . Hypertension 2008  . Personal history of chemotherapy 1990   BREAST CA  . Personal history of malignant neoplasm of breast 1990    left mastectomy   . Personal history of radiation therapy 1997   BREAST CA  . Sinusitis   . Sleep apnea    history of, went away after gastric surgery    Past Surgical History:  Procedure Laterality Date  . ABDOMINAL HYSTERECTOMY  1996  . COLONOSCOPY  2008, 2015   Dr. Jamal Collin  . DILATION AND CURETTAGE OF UTERUS    . KNEE ARTHROPLASTY Left 09/07/2017   Procedure: COMPUTER ASSISTED TOTAL KNEE ARTHROPLASTY;  Surgeon: Dereck Leep, MD;  Location: [facility] ORS;  Service: Orthopedics;  Laterality: Left;  . KNEE ARTHROSCOPY Bilateral   . KNEE SURGERY Left 2009, 2010, 2012  . LAPAROSCOPIC GASTRIC RESTRICTIVE DUODENAL PROCEDURE (DUODENAL SWITCH) N/A 04/17/2015   Procedure: LAPAROSCOPIC GASTRIC RESTRICTIVE DUODENAL PROCEDURE, single anastamosis ;  Surgeon: Bonner Puna,  MD;  Location: [facility] ORS;  Service: General;  Laterality: N/A;  . MASTECTOMY Left 1990   BREAST CA  . TONSILLECTOMY    . UPPER GI ENDOSCOPY N/A 04/17/2015   Procedure: UPPER GI ENDOSCOPY;  Surgeon: Bonner Puna, MD;  Location: [facility] ORS;  Service: General;  Laterality: N/A;    Family History  Problem Relation Age of Onset  . Heart failure Mother   . Hypertension Mother   . Hyperlipidemia Mother   . Kidney disease Mother   . CVA Mother   . Hypothyroidism Mother   . Breast cancer Maternal Aunt 76    Social History Social History   Tobacco Use  . Smoking status: Never Smoker  . Smokeless tobacco: Never Used  Substance Use Topics  . Alcohol use: No  . Drug use: No    Allergies  Allergen Reactions  . Tdap [Tetanus-Diphth-Acell Pertussis] Shortness Of Breath  . Penicillins Rash    Has patient had a PCN reaction causing immediate rash, facial/tongue/throat swelling, SOB or lightheadedness with hypotension: Yes Has patient had a PCN reaction causing severe rash involving mucus membranes or skin necrosis: No Has patient had a PCN reaction that required hospitalization: No Has patient had a PCN reaction occurring within the last 10 years: No If all of the above answers are "NO", then may proceed  with Cephalosporin use.     Current Outpatient Medications  Medication Sig Dispense Refill  . apixaban (ELIQUIS) 5 MG TABS tablet Take 5 mg by mouth 2 (two) times daily.    . Cholecalciferol 1.25 MG (50000 UT) capsule cholecalciferol (vitamin D3) 1,250 mcg (50,000 unit) capsule    . losartan-hydrochlorothiazide (HYZAAR) 50-12.5 MG tablet Take 1 tablet by mouth daily.    . Multiple Vitamin (MULTIVITAMIN WITH MINERALS) TABS tablet Take 1 tablet by mouth daily.    . predniSONE (DELTASONE) 10 MG tablet prednisone 10 mg tablets in a dose pack    . sotalol (BETAPACE) 80 MG tablet sotalol 80 mg tablet   1 tablet twice a day by oral route.     No current facility-administered medications for  this visit.     Review of Systems Review of Systems  Constitutional: Negative.   Respiratory: Negative.   Cardiovascular: Negative.     Blood pressure (!) 160/80, pulse 70, temperature 97.9 F (36.6 C), temperature source Temporal, resp. rate 17, height 5\' 6"  (1.676 m), weight 215 lb (97.5 kg), SpO2 98 %.  Physical Exam Physical Exam Constitutional:      Appearance: She is well-developed.  Eyes:     General: No scleral icterus.    Conjunctiva/sclera: Conjunctivae normal.  Neck:     Musculoskeletal: Normal range of motion.  Cardiovascular:     Rate and Rhythm: Normal rate and regular rhythm.     Heart sounds: Normal heart sounds.  Pulmonary:     Effort: Pulmonary effort is normal.     Breath sounds: Normal breath sounds.  Chest:     Breasts: Breasts are symmetrical.        Right: No inverted nipple, mass, nipple discharge, skin change or tenderness.     Lymphadenopathy:     Cervical: No cervical adenopathy.  Skin:    General: Skin is warm and dry.  Neurological:     Mental Status: She is alert and oriented to person, place, and time.     Data Reviewed Right screening mammogram dated November 09, 2018 reviewed: BI-RADS-1.  Incidental thickening noted in the retroareolar tissue unchanged from last year.  Upper extremity measurements were obtained at a location 15 cm above as well as 10 and 20 cm below the olecranon process.  Right: 38, 27, 19 cm.  Left: 35, 26, 20 cm.  No evidence of lymphedema.  Assessment    No evidence of recurrent breast cancer.    Plan The patient reports that she had been seen by plastic surgery about reconstruction and then there was some question about insurance coverage.  I did not think reconstruction postmastectomy was ever an issue, and if she is interested in reinvestigating this she will give the office a call.   Patient is to return to the office in 1 year with uni right mammogram.  HPI, Physical Exam, Assessment and Plan  have been scribed under the direction and in the presence of Hervey Ard, Md.  Eudelia Bunch R. Bobette Mo, CMA  I have completed the exam and reviewed the above documentation for accuracy and completeness.  I agree with the above.  Haematologist has been used and any errors in dictation or transcription are unintentional.  Hervey Ard, M.D., F.A.C.S.  Forest Gleason Furkan Keenum 11/11/2018, 2:13 PM

## 2018-11-11 NOTE — Patient Instructions (Addendum)
Patient is to return to the office in 1 year with uni right mammogram.   Call the office with any questions or concerns.

## 2018-11-23 DIAGNOSIS — C50912 Malignant neoplasm of unspecified site of left female breast: Secondary | ICD-10-CM | POA: Diagnosis not present

## 2018-12-13 DIAGNOSIS — M79672 Pain in left foot: Secondary | ICD-10-CM | POA: Diagnosis not present

## 2018-12-13 DIAGNOSIS — S90122A Contusion of left lesser toe(s) without damage to nail, initial encounter: Secondary | ICD-10-CM | POA: Diagnosis not present

## 2018-12-13 DIAGNOSIS — M2042 Other hammer toe(s) (acquired), left foot: Secondary | ICD-10-CM | POA: Diagnosis not present

## 2018-12-13 DIAGNOSIS — M898X9 Other specified disorders of bone, unspecified site: Secondary | ICD-10-CM | POA: Diagnosis not present

## 2019-03-28 ENCOUNTER — Encounter: Payer: Self-pay | Admitting: Physician Assistant

## 2019-03-28 ENCOUNTER — Telehealth: Payer: 59 | Admitting: Physician Assistant

## 2019-03-28 DIAGNOSIS — R21 Rash and other nonspecific skin eruption: Secondary | ICD-10-CM | POA: Diagnosis not present

## 2019-03-28 DIAGNOSIS — Z760 Encounter for issue of repeat prescription: Secondary | ICD-10-CM | POA: Diagnosis not present

## 2019-03-28 MED ORDER — CLOBETASOL PROPIONATE 0.05 % EX FOAM: Freq: Two times a day (BID) | CUTANEOUS | 0 refills | Status: DC

## 2019-03-28 NOTE — Progress Notes (Signed)
E Visit for Rash  We are sorry that you are not feeling well. Here is how we plan to help! I have refilled your Clobetasol Propionate foam for your scalp rash. Use twice daily. Follow up with your specialist for any further refills/workup.   HOME CARE:   Take cool showers and avoid direct sunlight.  Apply cool compress or wet dressings.  Take a bath in an oatmeal bath.  Sprinkle content of one Aveeno packet under running faucet with comfortably warm water.  Bathe for 15-20 minutes, 1-2 times daily.  Pat dry with a towel. Do not rub the rash.  Use hydrocortisone cream.  Take an antihistamine like Benadryl for widespread rashes that itch.  The adult dose of Benadryl is 25-50 mg by mouth 4 times daily.  Caution:  This type of medication may cause sleepiness.  Do not drink alcohol, drive, or operate dangerous machinery while taking antihistamines.  Do not take these medications if you have prostate enlargement.  Read package instructions thoroughly on all medications that you take.  GET HELP RIGHT AWAY IF:   Symptoms don't go away after treatment.  Severe itching that persists.  If you rash spreads or swells.  If you rash begins to smell.  If it blisters and opens or develops a yellow-brown crust.  You develop a fever.  You have a sore throat.  You become short of breath.  MAKE SURE YOU:  Understand these instructions. Will watch your condition. Will get help right away if you are not doing well or get worse.  Thank you for choosing an e-visit. Your e-visit answers were reviewed by a board certified advanced clinical practitioner to complete your personal care plan. Depending upon the condition, your plan could have included both over the counter or prescription medications. Please review your pharmacy choice. Be sure that the pharmacy you have chosen is open so that you can pick up your prescription now.  If there is a problem you may message your provider in [place] to  have the prescription routed to another pharmacy. Your safety is important to [place]. If you have drug allergies check your prescription carefully.  For the next 24 hours, you can use MyChart to ask questions about today's visit, request a non-urgent call back, or ask for a work or school excuse from your e-visit provider. You will get an email in the next two days asking about your experience. I hope that your e-visit has been valuable and will speed your recovery.     I have spent 7 min in completion and review of this note- Lacy Duverney [facility]

## 2019-04-21 ENCOUNTER — Other Ambulatory Visit: Payer: Self-pay

## 2019-04-21 DIAGNOSIS — I8393 Asymptomatic varicose veins of bilateral lower extremities: Secondary | ICD-10-CM

## 2019-05-12 ENCOUNTER — Other Ambulatory Visit: Payer: Self-pay

## 2019-05-12 ENCOUNTER — Ambulatory Visit (INDEPENDENT_AMBULATORY_CARE_PROVIDER_SITE_OTHER): Payer: 59 | Admitting: Vascular Surgery

## 2019-05-12 ENCOUNTER — Encounter (INDEPENDENT_AMBULATORY_CARE_PROVIDER_SITE_OTHER): Payer: Self-pay | Admitting: Vascular Surgery

## 2019-05-12 VITALS — BP 162/93 | HR 64 | Resp 12 | Ht 66.0 in | Wt 225.0 lb

## 2019-05-12 DIAGNOSIS — I10 Essential (primary) hypertension: Secondary | ICD-10-CM

## 2019-05-12 DIAGNOSIS — I4891 Unspecified atrial fibrillation: Secondary | ICD-10-CM | POA: Diagnosis not present

## 2019-05-12 DIAGNOSIS — I872 Venous insufficiency (chronic) (peripheral): Secondary | ICD-10-CM | POA: Diagnosis not present

## 2019-05-12 DIAGNOSIS — I8311 Varicose veins of right lower extremity with inflammation: Secondary | ICD-10-CM | POA: Insufficient documentation

## 2019-05-12 DIAGNOSIS — I8310 Varicose veins of unspecified lower extremity with inflammation: Secondary | ICD-10-CM

## 2019-05-12 DIAGNOSIS — I8312 Varicose veins of left lower extremity with inflammation: Secondary | ICD-10-CM | POA: Diagnosis not present

## 2019-05-12 NOTE — Progress Notes (Signed)
MRN : [id number]  Mandy Roberts is a 58 y.o. (January 25, 1961) female who presents with chief complaint of No chief complaint on file. Marland Kitchen  History of Present Illness:   The patient is seen for evaluation of symptomatic varicose veins. The patient relates burning and stinging which worsened steadily throughout the course of the day, particularly with standing. The patient also notes an aching and throbbing pain over the varicosities, particularly with prolonged dependent positions. The symptoms are significantly improved with elevation.  The patient also notes that during hot weather the symptoms are greatly intensified. The patient states the pain from the varicose veins interferes with work, daily exercise, shopping and household maintenance. At this point, the symptoms are persistent and severe enough that they're having a negative impact on lifestyle and are interfering with daily activities.  There is no history of DVT, PE or superficial thrombophlebitis. There is no history of ulceration or hemorrhage. The patient denies a significant family history of varicose veins.  The patient has not worn graduated compression in the past. At the present time the patient has not been using over-the-counter analgesics. There is no history of prior surgical intervention or sclerotherapy.    No outpatient medications have been marked as taking for the 05/12/19 encounter (Appointment) with Delana Meyer, Dolores Lory, MD.    Past Medical History:  Diagnosis Date  . Atrial fibrillation ([place])   . Breast cancer ([place]) 1990/1997    left mastectomy ,had recurrence in left suprclavic nodes several yrs ago-treated with radiation and chemo  . Colon polyp 2008  . Complication of anesthesia   . Diabetes mellitus without complication (HCC)    history of, loss of 105 lbs  . Difficult intubation    expectorated blood 3-4 days after surgery in 2011  . Difficult intubation    hypoxia with endoscopy in 2016  . History of  chemotherapy    5-FU/AC in 1990 under the care of Dr. Oliva Bustard. pt also tx with tamoxifen and arimidex  . History of radiation therapy    radiation treat at [facility]  . Hypertension 2008  . Personal history of chemotherapy 1990   BREAST CA  . Personal history of malignant neoplasm of breast 1990    left mastectomy   . Personal history of radiation therapy 1997   BREAST CA  . Sinusitis   . Sleep apnea    history of, went away after gastric surgery    Past Surgical History:  Procedure Laterality Date  . ABDOMINAL HYSTERECTOMY  1996  . COLONOSCOPY  2008, 2015   Dr. Jamal Collin  . DILATION AND CURETTAGE OF UTERUS    . KNEE ARTHROPLASTY Left 09/07/2017   Procedure: COMPUTER ASSISTED TOTAL KNEE ARTHROPLASTY;  Surgeon: Dereck Leep, MD;  Location: [facility] ORS;  Service: Orthopedics;  Laterality: Left;  . KNEE ARTHROSCOPY Bilateral   . KNEE SURGERY Left 2009, 2010, 2012  . LAPAROSCOPIC GASTRIC RESTRICTIVE DUODENAL PROCEDURE (DUODENAL SWITCH) N/A 04/17/2015   Procedure: LAPAROSCOPIC GASTRIC RESTRICTIVE DUODENAL PROCEDURE, single anastamosis ;  Surgeon: Bonner Puna, MD;  Location: [facility] ORS;  Service: General;  Laterality: N/A;  . MASTECTOMY Left 1990   BREAST CA  . TONSILLECTOMY    . UPPER GI ENDOSCOPY N/A 04/17/2015   Procedure: UPPER GI ENDOSCOPY;  Surgeon: Bonner Puna, MD;  Location: [facility] ORS;  Service: General;  Laterality: N/A;    Social History Social History   Tobacco Use  . Smoking status: Never Smoker  . Smokeless tobacco: Never Used  Substance Use Topics  . Alcohol use: No  . Drug use: No    Family History Family History  Problem Relation Age of Onset  . Heart failure Mother   . Hypertension Mother   . Hyperlipidemia Mother   . Kidney disease Mother   . CVA Mother   . Hypothyroidism Mother   . Breast cancer Maternal Aunt 76  No family history of bleeding/clotting disorders, porphyria or autoimmune disease    Allergies  Allergen Reactions  . Tdap [Tetanus-Diphth-Acell  Pertussis] Shortness Of Breath  . Penicillins Rash    Has patient had a PCN reaction causing immediate rash, facial/tongue/throat swelling, SOB or lightheadedness with hypotension: Yes Has patient had a PCN reaction causing severe rash involving mucus membranes or skin necrosis: No Has patient had a PCN reaction that required hospitalization: No Has patient had a PCN reaction occurring within the last 10 years: No If all of the above answers are "NO", then may proceed with Cephalosporin use.      REVIEW OF SYSTEMS (Negative unless checked)  Constitutional: [] Weight loss  [] Fever  [] Chills Cardiac: [] Chest pain   [] Chest pressure   [] Palpitations   [] Shortness of breath when laying flat   [] Shortness of breath with exertion. Vascular:  [] Pain in legs with walking   [x] Pain in legs at rest  [] History of DVT   [] Phlebitis   [x] Swelling in legs   [x] Varicose veins   [] Non-healing ulcers Pulmonary:   [] Uses home oxygen   [] Productive cough   [] Hemoptysis   [] Wheeze  [] COPD   [] Asthma Neurologic:  [] Dizziness   [] Seizures   [] History of stroke   [] History of TIA  [] Aphasia   [] Vissual changes   [] Weakness or numbness in arm   [] Weakness or numbness in leg Musculoskeletal:   [] Joint swelling   [] Joint pain   [] Low back pain Hematologic:  [] Easy bruising  [] Easy bleeding   [] Hypercoagulable state   [] Anemic Gastrointestinal:  [] Diarrhea   [] Vomiting  [] Gastroesophageal reflux/heartburn   [] Difficulty swallowing. Genitourinary:  [] Chronic kidney disease   [] Difficult urination  [] Frequent urination   [] Blood in urine Skin:  [] Rashes   [] Ulcers  Psychological:  [] History of anxiety   []  History of major depression.  Physical Examination  There were no vitals filed for this visit. There is no height or weight on file to calculate BMI. Gen: WD/WN, NAD Head: /AT, No temporalis wasting.  Ear/Nose/Throat: Hearing grossly intact, nares w/o erythema or drainage, poor dentition Eyes: PER, EOMI,  sclera nonicteric.  Neck: Supple, no masses.  No bruit or JVD.  Pulmonary:  Good air movement, clear to auscultation bilaterally, no use of accessory muscles.  Cardiac: RRR, normal S1, S2, no Murmurs. Vascular:scattered varicosities present bilaterally particularly in the ankle and foot.  Mild venous stasis changes to the legs bilaterally.  2+ soft pitting edema Vessel Right Left  Radial Palpable Palpable  PT Palpable Palpable  DP Palpable Palpable  Gastrointestinal: soft, non-distended. No guarding/no peritoneal signs.  Musculoskeletal: M/S 5/5 throughout.  No deformity or atrophy.  Neurologic: CN 2-12 intact. Pain and light touch intact in extremities.  Symmetrical.  Speech is fluent. Motor exam as listed above. Psychiatric: Judgment intact, Mood & affect appropriate for pt's clinical situation. Dermatologic: Venous rashes no ulcers noted.  No changes consistent with cellulitis. Lymph : No Cervical lymphadenopathy, no lichenification or skin changes of chronic lymphedema.  CBC Lab Results  Component Value Date   WBC 4.5 11/12/2017   HGB 13.6 11/12/2017   HCT  42.6 11/12/2017   MCV 90.2 11/12/2017   PLT 139 (L) 11/12/2017    BMET    Component Value Date/Time   NA 140 11/12/2017 0634   NA 137 09/12/2013 1638   K 3.5 11/12/2017 0634   K 3.9 09/12/2013 1638   CL 107 11/12/2017 0634   CL 105 09/12/2013 1638   CO2 27 11/12/2017 0634   CO2 27 09/12/2013 1638   GLUCOSE 102 (H) 11/12/2017 0634   GLUCOSE 148 (H) 09/12/2013 1638   BUN 15 11/12/2017 0634   BUN 9 09/12/2013 1638   CREATININE 0.71 11/12/2017 0634   CREATININE 0.72 09/12/2013 1638   CALCIUM 9.2 11/12/2017 0634   CALCIUM 9.4 09/12/2013 1638   GFRNONAA >60 11/12/2017 0634   GFRNONAA >60 09/12/2013 1638   GFRAA >60 11/12/2017 0634   GFRAA >60 09/12/2013 1638   CrCl cannot be calculated (Patient's most recent lab result is older than the maximum 21 days allowed.).  COAG Lab Results  Component Value Date   INR  1.10 08/26/2017   INR 1.1 11/20/2014    Radiology No results found.   Assessment/Plan 1. Varicose veins with inflammation  Recommend:  The patient has large symptomatic varicose veins that are painful and associated with swelling.  I have had a long discussion with the patient regarding  varicose veins and why they cause symptoms.  Patient will begin wearing graduated compression stockings class 1 on a daily basis, beginning first thing in the morning and removing them in the evening.   Pending the results of these changes the  patient will be reevaluated.   An  ultrasound of the venous system will be obtained.   Further plans will be based on the ultrasound results and whether conservative therapies are successful at eliminating the pain and swelling.   - VAS [place] LOWER EXTREMITY VENOUS REFLUX; Future  2. Chronic venous insufficiency No surgery or intervention at this point in time.    I have had a long discussion with the patient regarding venous insufficiency and why it  causes symptoms. I have discussed with the patient the chronic skin changes that accompany venous insufficiency and the long term sequela such as infection and ulceration.  Patient will begin wearing graduated compression stockings class 1 (20-30 mmHg) or compression wraps on a daily basis a prescription was given. The patient will put the stockings on first thing in the morning and removing them in the evening. The patient is instructed specifically not to sleep in the stockings.    In addition, behavioral modification including several periods of elevation of the lower extremities during the day will be continued. I have demonstrated that proper elevation is a position with the ankles at heart level.  The patient is instructed to begin routine exercise, especially walking on a daily basis  Patient should undergo duplex ultrasound of the venous system to ensure that DVT or reflux is not present.  Following the  review of the ultrasound the patient will follow up to reassess the degree of swelling and the control that graduated compression stockings or compression wraps  is offering.   The patient can be assessed for a Lymph Pump at that time  3. Atrial fibrillation, unspecified type ([place]) Continue antiarrhythmia medications as already ordered, these medications have been reviewed and there are no changes at this time.  Continue anticoagulation as ordered by Cardiology Service   4. Essential hypertension Continue antihypertensive medications as already ordered, these medications have been reviewed and there are  no changes at this time.     Hortencia Pilar, MD  05/12/2019 7:28 AM

## 2019-05-20 DIAGNOSIS — E559 Vitamin D deficiency, unspecified: Secondary | ICD-10-CM | POA: Diagnosis not present

## 2019-05-20 DIAGNOSIS — Z0000 Encounter for general adult medical examination without abnormal findings: Secondary | ICD-10-CM | POA: Diagnosis not present

## 2019-05-20 DIAGNOSIS — F411 Generalized anxiety disorder: Secondary | ICD-10-CM | POA: Diagnosis not present

## 2019-05-20 DIAGNOSIS — R5383 Other fatigue: Secondary | ICD-10-CM | POA: Diagnosis not present

## 2019-05-20 DIAGNOSIS — G4733 Obstructive sleep apnea (adult) (pediatric): Secondary | ICD-10-CM | POA: Diagnosis not present

## 2019-05-20 DIAGNOSIS — R7302 Impaired glucose tolerance (oral): Secondary | ICD-10-CM | POA: Diagnosis not present

## 2019-05-20 DIAGNOSIS — E669 Obesity, unspecified: Secondary | ICD-10-CM | POA: Diagnosis not present

## 2019-05-31 DIAGNOSIS — N951 Menopausal and female climacteric states: Secondary | ICD-10-CM | POA: Diagnosis not present

## 2019-06-03 ENCOUNTER — Encounter (INDEPENDENT_AMBULATORY_CARE_PROVIDER_SITE_OTHER): Payer: Self-pay | Admitting: Nurse Practitioner

## 2019-06-03 ENCOUNTER — Ambulatory Visit (INDEPENDENT_AMBULATORY_CARE_PROVIDER_SITE_OTHER): Payer: 59

## 2019-06-03 ENCOUNTER — Other Ambulatory Visit: Payer: Self-pay

## 2019-06-03 ENCOUNTER — Ambulatory Visit (INDEPENDENT_AMBULATORY_CARE_PROVIDER_SITE_OTHER): Payer: 59 | Admitting: Nurse Practitioner

## 2019-06-03 VITALS — BP 117/78 | HR 90 | Resp 16 | Wt 208.0 lb

## 2019-06-03 DIAGNOSIS — Z79899 Other long term (current) drug therapy: Secondary | ICD-10-CM

## 2019-06-03 DIAGNOSIS — I8312 Varicose veins of left lower extremity with inflammation: Secondary | ICD-10-CM

## 2019-06-03 DIAGNOSIS — I8311 Varicose veins of right lower extremity with inflammation: Secondary | ICD-10-CM

## 2019-06-03 DIAGNOSIS — I10 Essential (primary) hypertension: Secondary | ICD-10-CM

## 2019-06-03 DIAGNOSIS — I8310 Varicose veins of unspecified lower extremity with inflammation: Secondary | ICD-10-CM

## 2019-06-08 ENCOUNTER — Telehealth: Payer: Self-pay | Admitting: General Surgery

## 2019-06-08 NOTE — Telephone Encounter (Signed)
The patient called with a weeks history of increasing abdominal pain and loose stools.  Last night she had more pain in the right upper quadrant with radiation of the back and under the breast.  Mild nausea but no vomiting. The patient was evaluated in December 2018 for similar symptoms and had an ultrasound showing evidence of sludge in the gallbladder and upper limit of normal common bile duct diameter.  Very minimal elevation of serum transaminases noted at that time as well as the alkaline phosphatase with a normal bilirubin.  The patient's abdominal symptoms from December 2018 resolved until last week.  Patient is no longer on Eliquis as her atrial fibrillation has resolved.  Status post duodenal switch in 2016 with excellent weight loss.  The patient reports decreased appetite and increased stool frequency.  No reported fever or chills.  She reports this in the last couple of weeks she had laboratory studies with her PCP at [facility] and again a mild elevation of serum alkaline phosphatase was reported.  We will arrange for a repeat abdominal ultrasound to assess for the possibility of cholelithiasis.

## 2019-06-09 ENCOUNTER — Telehealth: Payer: Self-pay | Admitting: *Deleted

## 2019-06-09 ENCOUNTER — Encounter (INDEPENDENT_AMBULATORY_CARE_PROVIDER_SITE_OTHER): Payer: Self-pay | Admitting: Nurse Practitioner

## 2019-06-09 ENCOUNTER — Other Ambulatory Visit: Payer: Self-pay | Admitting: *Deleted

## 2019-06-09 DIAGNOSIS — R195 Other fecal abnormalities: Secondary | ICD-10-CM

## 2019-06-09 DIAGNOSIS — R110 Nausea: Secondary | ICD-10-CM

## 2019-06-09 DIAGNOSIS — R1011 Right upper quadrant pain: Secondary | ICD-10-CM

## 2019-06-09 NOTE — Progress Notes (Signed)
SUBJECTIVE:  Patient ID: Mandy Roberts, female    DOB: 11/28/1960, 58 y.o.   MRN: [id number] Chief Complaint  Patient presents with  . Follow-up    ultrasound follow up    HPI  Mandy Roberts is a 59 y.o. female The patient is seen for evaluation of symptomatic varicose veins. The patient relates burning and stinging which worsened steadily throughout the course of the day, particularly with standing. The patient also notes an aching and throbbing pain over the varicosities, particularly with prolonged dependent positions. The symptoms are significantly improved with elevation.  The patient also notes that during hot weather the symptoms are greatly intensified. The patient states the pain from the varicose veins interferes with work, daily exercise, shopping and household maintenance. At this point, the symptoms are persistent and severe enough that they're having a negative impact on lifestyle and are interfering with daily activities.  There is no history of DVT, PE or superficial thrombophlebitis. There is no history of ulceration or hemorrhage. The patient denies a significant family history of varicose veins.  The patient has recently started wearing graduated compression . At the present time the patient has been using over-the-counter analgesics. There is no history of prior surgical intervention or sclerotherapy.  Noninvasive studies reveal that the patient has evidence of reflux within her right great saphenous vein in the left common femoral vein.  There is no evidence of DVT or superficial venous thrombosis bilaterally.  There also appears to be a Baker's cyst behind the left knee measuring 1.74 x 1.06 cm  Past Medical History:  Diagnosis Date  . Atrial fibrillation ([place])   . Breast cancer ([place]) 1990/1997    left mastectomy ,had recurrence in left suprclavic nodes several yrs ago-treated with radiation and chemo  . Colon polyp 2008  . Complication of anesthesia   .  Diabetes mellitus without complication (HCC)    history of, loss of 105 lbs  . Difficult intubation    expectorated blood 3-4 days after surgery in 2011  . Difficult intubation    hypoxia with endoscopy in 2016  . History of chemotherapy    5-FU/AC in 1990 under the care of Dr. Oliva Bustard. pt also tx with tamoxifen and arimidex  . History of radiation therapy    radiation treat at [facility]  . Hypertension 2008  . Personal history of chemotherapy 1990   BREAST CA  . Personal history of malignant neoplasm of breast 1990    left mastectomy   . Personal history of radiation therapy 1997   BREAST CA  . Sinusitis   . Sleep apnea    history of, went away after gastric surgery    Past Surgical History:  Procedure Laterality Date  . ABDOMINAL HYSTERECTOMY  1996  . COLONOSCOPY  2008, 2015   Dr. Jamal Collin  . DILATION AND CURETTAGE OF UTERUS    . KNEE ARTHROPLASTY Left 09/07/2017   Procedure: COMPUTER ASSISTED TOTAL KNEE ARTHROPLASTY;  Surgeon: Dereck Leep, MD;  Location: [facility] ORS;  Service: Orthopedics;  Laterality: Left;  . KNEE ARTHROSCOPY Bilateral   . KNEE SURGERY Left 2009, 2010, 2012  . LAPAROSCOPIC GASTRIC RESTRICTIVE DUODENAL PROCEDURE (DUODENAL SWITCH) N/A 04/17/2015   Procedure: LAPAROSCOPIC GASTRIC RESTRICTIVE DUODENAL PROCEDURE, single anastamosis ;  Surgeon: Bonner Puna, MD;  Location: [facility] ORS;  Service: General;  Laterality: N/A;  . MASTECTOMY Left 1990   BREAST CA  . TONSILLECTOMY    . UPPER GI ENDOSCOPY N/A 04/17/2015   Procedure:  UPPER GI ENDOSCOPY;  Surgeon: Bonner Puna, MD;  Location: [facility] ORS;  Service: General;  Laterality: N/A;    Social History   Socioeconomic History  . Marital status: Widowed    Spouse name: Not on file  . Number of children: Not on file  . Years of education: Not on file  . Highest education level: Not on file  Occupational History  . Not on file  Social Needs  . Financial resource strain: Not on file  . Food insecurity    Worry: Not on  file    Inability: Not on file  . Transportation needs    Medical: Not on file    Non-medical: Not on file  Tobacco Use  . Smoking status: Never Smoker  . Smokeless tobacco: Never Used  Substance and Sexual Activity  . Alcohol use: No  . Drug use: No  . Sexual activity: Not on file  Lifestyle  . Physical activity    Days per week: Not on file    Minutes per session: Not on file  . Stress: Not on file  Relationships  . Social Herbalist on phone: Not on file    Gets together: Not on file    Attends religious service: Not on file    Active member of club or organization: Not on file    Attends meetings of clubs or organizations: Not on file    Relationship status: Not on file  . Intimate partner violence    Fear of current or ex partner: Not on file    Emotionally abused: Not on file    Physically abused: Not on file    Forced sexual activity: Not on file  Other Topics Concern  . Not on file  Social History Narrative  . Not on file    Family History  Problem Relation Age of Onset  . Heart failure Mother   . Hypertension Mother   . Hyperlipidemia Mother   . Kidney disease Mother   . CVA Mother   . Hypothyroidism Mother   . Breast cancer Maternal Aunt 76    Allergies  Allergen Reactions  . Tdap [Tetanus-Diphth-Acell Pertussis] Shortness Of Breath  . Penicillins Rash    Has patient had a PCN reaction causing immediate rash, facial/tongue/throat swelling, SOB or lightheadedness with hypotension: Yes Has patient had a PCN reaction causing severe rash involving mucus membranes or skin necrosis: No Has patient had a PCN reaction that required hospitalization: No Has patient had a PCN reaction occurring within the last 10 years: No If all of the above answers are "NO", then may proceed with Cephalosporin use.      Review of Systems   Review of Systems: Negative Unless Checked Constitutional: [] Weight loss  [] Fever  [] Chills Cardiac: [] Chest pain   [x]   Atrial Fibrillation  [] Palpitations   [] Shortness of breath when laying flat   [] Shortness of breath with exertion. [] Shortness of breath at rest Vascular:  [] Pain in legs with walking   [] Pain in legs with standing [] Pain in legs when laying flat   [] Claudication    [] Pain in feet when laying flat    [] History of DVT   [] Phlebitis   [x] Swelling in legs   [x] Varicose veins   [] Non-healing ulcers Pulmonary:   [] Uses home oxygen   [] Productive cough   [] Hemoptysis   [] Wheeze  [] COPD   [] Asthma Neurologic:  [] Dizziness   [] Seizures  [] Blackouts [] History of stroke   [] History of TIA  []   Aphasia   [] Temporary Blindness   [] Weakness or numbness in arm   [] Weakness or numbness in leg Musculoskeletal:   [] Joint swelling   [] Joint pain   [] Low back pain  []  History of Knee Replacement [] Arthritis [] back Surgeries  []  Spinal Stenosis    Hematologic:  [] Easy bruising  [] Easy bleeding   [] Hypercoagulable state   [] Anemic Gastrointestinal:  [] Diarrhea   [] Vomiting  [] Gastroesophageal reflux/heartburn   [] Difficulty swallowing. [] Abdominal pain Genitourinary:  [] Chronic kidney disease   [] Difficult urination  [] Anuric   [] Blood in urine [] Frequent urination  [] Burning with urination   [] Hematuria Skin:  [] Rashes   [] Ulcers [] Wounds Psychological:  [] History of anxiety   []  History of major depression  []  Memory Difficulties      OBJECTIVE:   Physical Exam  BP 117/78 (BP Location: Right Arm)   Pulse 90   Resp 16   Wt 208 lb (94.3 kg)   LMP  (LMP Unknown)   BMI 33.57 kg/m   Gen: WD/WN, NAD Head: [place]/AT, No temporalis wasting.  Ear/Nose/Throat: Hearing grossly intact, nares w/o erythema or drainage Eyes: PER, EOMI, sclera nonicteric.  Neck: Supple, no masses.  No JVD.  Pulmonary:  Good air movement, no use of accessory muscles.  Cardiac: RRR Vascular: scattered varicosities present bilaterally.  Mild venous stasis changes to the legs bilaterally.  2+ soft pitting edema  Vessel Right Left  Radial  Palpable Palpable  Dorsalis Pedis Palpable Palpable  Posterior Tibial Palpable Palpable   Gastrointestinal: soft, non-distended. No guarding/no peritoneal signs.  Musculoskeletal: M/S 5/5 throughout.  No deformity or atrophy.  Neurologic: Pain and light touch intact in extremities.  Symmetrical.  Speech is fluent. Motor exam as listed above. Psychiatric: Judgment intact, Mood & affect appropriate for pt's clinical situation. Dermatologic: No Venous rashes. No Ulcers Noted.  No changes consistent with cellulitis. Lymph : No Cervical lymphadenopathy, no lichenification or skin changes of chronic lymphedema.       ASSESSMENT AND PLAN:  1. Varicose veins of both lower extremities with inflammation  Recommend:  The patient has large symptomatic varicose veins that are painful and associated with swelling.  I have had a long discussion with the patient regarding  varicose veins and why they cause symptoms.  Patient will continue wearing graduated compression stockings class 1 on a daily basis, beginning first thing in the morning and removing them in the evening. The patient is instructed specifically not to sleep in the stockings.    The patient  will also continue using over-the-counter analgesics such as Motrin 600 mg po TID to help control the symptoms.    In addition, behavioral modification including elevation during the day will be continued.    Further plans will be based on the ultrasound results and whether conservative therapies are successful at eliminating the pain and swelling.   2. Essential hypertension Continue antihypertensive medications as already ordered, these medications have been reviewed and there are no changes at this time.    Current Outpatient Medications on File Prior to Visit  Medication Sig Dispense Refill  . alendronate (FOSAMAX) 70 MG tablet     . apixaban (ELIQUIS) 5 MG TABS tablet Take 5 mg by mouth 2 (two) times daily.    . Cholecalciferol 1.25 MG  (50000 UT) capsule cholecalciferol (vitamin D3) 1,250 mcg (50,000 unit) capsule    . clobetasol (OLUX) 0.05 % topical foam Apply topically 2 (two) times daily. 50 g 0  . losartan-hydrochlorothiazide (HYZAAR) 50-12.5 MG tablet Take 1 tablet  by mouth daily.    . Multiple Vitamin (MULTIVITAMIN WITH MINERALS) TABS tablet Take 1 tablet by mouth daily.    . predniSONE (DELTASONE) 10 MG tablet prednisone 10 mg tablets in a dose pack    . sotalol (BETAPACE) 80 MG tablet sotalol 80 mg tablet   1 tablet twice a day by oral route.     No current facility-administered medications on file prior to visit.     There are no Patient Instructions on file for this visit. No follow-ups on file.   Kris Hartmann, NP  This note was completed with Sales executive.  Any errors are purely unintentional.

## 2019-06-09 NOTE — Telephone Encounter (Signed)
Patient has been scheduled for a RUQ abdominal ultrasound for 06-16-19 at 9:30 am (arrive 9:15 am). Prep: NPO after midnight.   The patient verbalizes understanding of the above.

## 2019-06-15 ENCOUNTER — Ambulatory Visit: Payer: 59 | Attending: Specialist

## 2019-06-16 ENCOUNTER — Ambulatory Visit
Admission: RE | Admit: 2019-06-16 | Discharge: 2019-06-16 | Disposition: A | Discharge status: Home / Self Care | Payer: 59 | Source: Ambulatory Visit | Attending: General Surgery | Admitting: General Surgery

## 2019-06-16 ENCOUNTER — Telehealth: Payer: Self-pay

## 2019-06-16 ENCOUNTER — Other Ambulatory Visit: Payer: Self-pay

## 2019-06-16 DIAGNOSIS — K760 Fatty (change of) liver, not elsewhere classified: Secondary | ICD-10-CM | POA: Diagnosis not present

## 2019-06-16 DIAGNOSIS — R195 Other fecal abnormalities: Secondary | ICD-10-CM | POA: Insufficient documentation

## 2019-06-16 DIAGNOSIS — R110 Nausea: Secondary | ICD-10-CM | POA: Insufficient documentation

## 2019-06-16 DIAGNOSIS — R1011 Right upper quadrant pain: Secondary | ICD-10-CM | POA: Insufficient documentation

## 2019-06-16 NOTE — Telephone Encounter (Signed)
Patient notified per Dr Dahlia Byes that her ultrasound did not show anything alarming. She will follow up with Dr Celine Ahr on 06/20/19.

## 2019-06-20 ENCOUNTER — Ambulatory Visit: Payer: 59 | Admitting: General Surgery

## 2019-06-20 DIAGNOSIS — M7581 Other shoulder lesions, right shoulder: Secondary | ICD-10-CM | POA: Diagnosis not present

## 2019-06-20 DIAGNOSIS — M25572 Pain in left ankle and joints of left foot: Secondary | ICD-10-CM | POA: Diagnosis not present

## 2019-06-20 DIAGNOSIS — F411 Generalized anxiety disorder: Secondary | ICD-10-CM | POA: Diagnosis not present

## 2019-06-20 DIAGNOSIS — E668 Other obesity: Secondary | ICD-10-CM | POA: Diagnosis not present

## 2019-06-20 DIAGNOSIS — G4733 Obstructive sleep apnea (adult) (pediatric): Secondary | ICD-10-CM | POA: Diagnosis not present

## 2019-06-21 DIAGNOSIS — M84375A Stress fracture, left foot, initial encounter for fracture: Secondary | ICD-10-CM | POA: Diagnosis not present

## 2019-06-21 DIAGNOSIS — G4733 Obstructive sleep apnea (adult) (pediatric): Secondary | ICD-10-CM | POA: Diagnosis not present

## 2019-06-21 DIAGNOSIS — M79672 Pain in left foot: Secondary | ICD-10-CM | POA: Diagnosis not present

## 2019-06-21 DIAGNOSIS — M25572 Pain in left ankle and joints of left foot: Secondary | ICD-10-CM | POA: Diagnosis not present

## 2019-06-21 DIAGNOSIS — M7581 Other shoulder lesions, right shoulder: Secondary | ICD-10-CM | POA: Diagnosis not present

## 2019-06-21 DIAGNOSIS — F411 Generalized anxiety disorder: Secondary | ICD-10-CM | POA: Diagnosis not present

## 2019-06-21 DIAGNOSIS — E668 Other obesity: Secondary | ICD-10-CM | POA: Diagnosis not present

## 2019-06-28 ENCOUNTER — Other Ambulatory Visit: Payer: Self-pay

## 2019-06-28 ENCOUNTER — Encounter: Payer: Self-pay | Admitting: General Surgery

## 2019-06-28 ENCOUNTER — Ambulatory Visit (INDEPENDENT_AMBULATORY_CARE_PROVIDER_SITE_OTHER): Payer: 59 | Admitting: General Surgery

## 2019-06-28 VITALS — BP 147/77 | HR 75 | Temp 97.5°F | Ht 66.0 in | Wt 216.0 lb

## 2019-06-28 DIAGNOSIS — R1011 Right upper quadrant pain: Secondary | ICD-10-CM

## 2019-06-28 NOTE — Progress Notes (Signed)
Patient ID: Mandy Roberts, female   DOB: Jun 04, 1961, 58 y.o.   MRN: [id number]  Chief Complaint  Patient presents with  . Follow-up    HPI Mandy Roberts is a 58 y.o. female.   She is here today for follow-up of an ultrasound that was ordered by 1 of our previous surgeons, Dr. Bary Castilla.  As he is no longer with the practice, I have inherited her care.  She reports that in 2016, she underwent weight loss surgery (duodenal switch).  They did not remove her gallbladder at that time.  In 2018, she saw Dr. Jamal Collin for right upper quadrant pain.  Imaging done at that time showed some biliary sludge along with a mild elevation in her alkaline phosphatase.  There was no gallbladder wall thickening, no choleithiasis, no significant biliary ductal dilatation, no pericholecystic fluid.  No intervention was performed at that time.  More recently, within the past few months, she has noted increasing frequency of what she calls "episodes" during which she feels a tightening, like something contracting, in her right upper quadrant.  Sometimes it radiates to her back.  Sometimes she is a little bit nauseated, but she has not had any emesis associated.  She is not sure if there is any specific relationship to eating; she has not noticed.  She states that the discomfort is usually present "like a little toothache," accompanied by some fullness.  She contacted Dr. Bary Castilla via telephone.  He ordered a right upper quadrant ultrasound.  In the interim, he left our practice.  Follow-up was arranged with me.  She has already seen the results of the ultrasound, which was essentially negative.   Past Medical History:  Diagnosis Date  . Atrial fibrillation ([place])   . Breast cancer ([place]) 1990/1997    left mastectomy ,had recurrence in left suprclavic nodes several yrs ago-treated with radiation and chemo  . Colon polyp 2008  . Complication of anesthesia   . Diabetes mellitus without complication (H[facility])    history of, loss of  105 lbs  . Difficult intubation    expectorated blood 3-4 days after surgery in 2011  . Difficult intubation    hypoxia with endoscopy in 2016  . History of chemotherapy    5-FU/AC in 1990 under the care of Dr. Oliva Bustard. pt also tx with tamoxifen and arimidex  . History of radiation therapy    radiation treat at [facility]  . Hypertension 2008  . Personal history of chemotherapy 1990   BREAST CA  . Personal history of malignant neoplasm of breast 1990    left mastectomy   . Personal history of radiation therapy 1997   BREAST CA  . Sinusitis   . Sleep apnea    history of, went away after gastric surgery    Past Surgical History:  Procedure Laterality Date  . ABDOMINAL HYSTERECTOMY  1996  . COLONOSCOPY  2008, 2015   Dr. Jamal Collin  . DILATION AND CURETTAGE OF UTERUS    . KNEE ARTHROPLASTY Left 09/07/2017   Procedure: COMPUTER ASSISTED TOTAL KNEE ARTHROPLASTY;  Surgeon: Dereck Leep, MD;  Location: [facility] ORS;  Service: Orthopedics;  Laterality: Left;  . KNEE ARTHROSCOPY Bilateral   . KNEE SURGERY Left 2009, 2010, 2012  . LAPAROSCOPIC GASTRIC RESTRICTIVE DUODENAL PROCEDURE (DUODENAL SWITCH) N/A 04/17/2015   Procedure: LAPAROSCOPIC GASTRIC RESTRICTIVE DUODENAL PROCEDURE, single anastamosis ;  Surgeon: Bonner Puna, MD;  Location: [facility] ORS;  Service: General;  Laterality: N/A;  . MASTECTOMY Left 1990  BREAST CA  . TONSILLECTOMY    . UPPER GI ENDOSCOPY N/A 04/17/2015   Procedure: UPPER GI ENDOSCOPY;  Surgeon: Bonner Puna, MD;  Location: [facility] ORS;  Service: General;  Laterality: N/A;    Family History  Problem Relation Age of Onset  . Heart failure Mother   . Hypertension Mother   . Hyperlipidemia Mother   . Kidney disease Mother   . CVA Mother   . Hypothyroidism Mother   . Breast cancer Maternal Aunt 76    Social History Social History   Tobacco Use  . Smoking status: Never Smoker  . Smokeless tobacco: Never Used  Substance Use Topics  . Alcohol use: No  . Drug use: No     Allergies  Allergen Reactions  . Tdap [Tetanus-Diphth-Acell Pertussis] Shortness Of Breath  . Penicillins Rash    Has patient had a PCN reaction causing immediate rash, facial/tongue/throat swelling, SOB or lightheadedness with hypotension: Yes Has patient had a PCN reaction causing severe rash involving mucus membranes or skin necrosis: No Has patient had a PCN reaction that required hospitalization: No Has patient had a PCN reaction occurring within the last 10 years: No If all of the above answers are "NO", then may proceed with Cephalosporin use.     Current Outpatient Medications  Medication Sig Dispense Refill  . alendronate (FOSAMAX) 70 MG tablet     . Cholecalciferol 1.25 MG (50000 UT) capsule cholecalciferol (vitamin D3) 1,250 mcg (50,000 unit) capsule    . clobetasol (OLUX) 0.05 % topical foam Apply topically 2 (two) times daily. 50 g 0  . Multiple Vitamin (MULTIVITAMIN WITH MINERALS) TABS tablet Take 1 tablet by mouth daily.    Marland Kitchen apixaban (ELIQUIS) 5 MG TABS tablet Take 5 mg by mouth 2 (two) times daily.    Marland Kitchen losartan-hydrochlorothiazide (HYZAAR) 50-12.5 MG tablet Take 1 tablet by mouth daily.    . predniSONE (DELTASONE) 10 MG tablet prednisone 10 mg tablets in a dose pack    . sotalol (BETAPACE) 80 MG tablet sotalol 80 mg tablet   1 tablet twice a day by oral route.     No current facility-administered medications for this visit.      Blood pressure (!) 147/77, pulse 75, temperature (!) 97.5 F (36.4 C), temperature source Skin, height 5\' 6"  (1.676 m), weight 216 lb (98 kg), SpO2 98 %.  Physical Exam Physical Exam Constitutional:      General: She is not in acute distress.    Appearance: She is obese.  HENT:     Head: Normocephalic and atraumatic.     Nose:     Comments: Covered with a mask secondary to COVID-19 precautions    Mouth/Throat:     Comments: Covered with a mask secondary to COVID-19 precautions Eyes:     General: No scleral icterus.        Right eye: No discharge.        Left eye: No discharge.  Abdominal:     Comments: Extensive excess skin secondary to market weight loss.  There is mild tenderness to palpation in the right upper quadrant.  There are laparoscopic scars from her prior weight loss surgery.  Genitourinary:    Comments: Deferred Skin:    General: Skin is warm and dry.  Neurological:     General: No focal deficit present.     Mental Status: She is alert and oriented to person, place, and time.  Psychiatric:        Mood and  Affect: Mood normal.        Behavior: Behavior normal.     Data Reviewed The most recent labs are from 2018, and therefore unlikely to be relevant to today's visit.  These did describe mild elevation of the transaminases and alkaline phosphatase  The 2018 right upper quadrant ultrasound was also reviewed.  This showed some sludge without stones and no bile duct dilatation.  The report is copied here:  CLINICAL DATA:  Epigastric pain beginning last night.  EXAM: ULTRASOUND ABDOMEN LIMITED RIGHT UPPER QUADRANT  COMPARISON:  11/28/2014  FINDINGS: Gallbladder:  Small amount of sludge in the gallbladder. No shadowing stones. No wall thickening. No Murphy sign.  Common bile duct:  Diameter: 7.8 mm, borderline enlarged. No ductal stone seen, but that concern does exist.  Liver:  No focal lesion identified. Within normal limits in parenchymal echogenicity. Portal vein is patent on color Doppler imaging with normal direction of blood flow towards the liver.  IMPRESSION: Echogenic sludge in the gallbladder. No shadowing stones. No Murphy sign. Common duct measures 7.8 mm, slightly enlarged. No ductal stone is seen, but that possibility does exist based on these findings.  The more recent study, dated June 16, 2019, does not show any sludge or stones.  Minimal enlargement of the common bile duct.  The report is copied here: CLINICAL DATA:  Right upper quadrant pain and  nausea  EXAM: ULTRASOUND ABDOMEN LIMITED RIGHT UPPER QUADRANT  COMPARISON:  Ultrasound, 11/12/2017  FINDINGS: Gallbladder:  Distended, but with no stones or wall thickening. No pericholecystic fluid.  Common bile duct:  Diameter: 8 mm, unchanged from the prior ultrasound. No visualized duct stone.  Liver:  Increased parenchymal echogenicity. No mass or focal lesion. Portal vein is patent on color Doppler imaging with normal direction of blood flow towards the liver.  IMPRESSION: 1. No acute findings. 2. Distended gallbladder, but no evidence of acute cholecystitis. No gallstones. 3. Common bile duct dilated to 8 mm, but unchanged from the prior ultrasound. 4. Hepatic steatosis.   Assessment/Plan This is a 58 year old woman who is undergone weight loss surgery.  Her gallbladder was not removed at that time.  She has symptoms of right upper quadrant pain that are worsening, however she does not have any notable findings on right upper quadrant ultrasound.  She may have a component of biliary dyskinesia. To better evaluate this, we will order a HIDA scan.  I will call her with these results and discuss them with her once they are available.  Fortunately, her duodenal switch was performed laparoscopically, so hopefully the degree of scar tissue and adhesions will be minimal, should we need to remove her gallbladder.    Fredirick Maudlin 06/28/2019, 3:15 PM

## 2019-06-28 NOTE — Patient Instructions (Addendum)
HIDA screen scheduled on 8/ 18/2020 @ 8:30am  Be there at [facility] . No food or drinks 6 hours prior to APPT.

## 2019-07-05 DIAGNOSIS — M79672 Pain in left foot: Secondary | ICD-10-CM | POA: Diagnosis not present

## 2019-07-05 DIAGNOSIS — M84375D Stress fracture, left foot, subsequent encounter for fracture with routine healing: Secondary | ICD-10-CM | POA: Diagnosis not present

## 2019-07-12 ENCOUNTER — Encounter
Admission: RE | Admit: 2019-07-12 | Discharge: 2019-07-12 | Disposition: A | Discharge status: Home / Self Care | Payer: 59 | Source: Ambulatory Visit | Attending: General Surgery | Admitting: General Surgery

## 2019-07-12 ENCOUNTER — Other Ambulatory Visit: Payer: Self-pay

## 2019-07-12 DIAGNOSIS — R1011 Right upper quadrant pain: Secondary | ICD-10-CM | POA: Diagnosis not present

## 2019-07-12 MED ORDER — TECHNETIUM TC 99M MEBROFENIN IV KIT
5.0000 | PACK | Freq: Once | INTRAVENOUS | Status: AC | PRN
  Administered 2019-07-12: 09:00:00 5.522 via INTRAVENOUS

## 2019-07-13 ENCOUNTER — Encounter: Payer: Self-pay | Admitting: General Surgery

## 2019-07-21 ENCOUNTER — Ambulatory Visit: Payer: 59 | Admitting: General Surgery

## 2019-07-26 DIAGNOSIS — M84375D Stress fracture, left foot, subsequent encounter for fracture with routine healing: Secondary | ICD-10-CM | POA: Diagnosis not present

## 2019-07-27 ENCOUNTER — Telehealth: Payer: Self-pay | Admitting: *Deleted

## 2019-07-27 NOTE — Telephone Encounter (Signed)
Patient contacted today since she did not read her My Chart message that was sent from Dr. Celine Ahr.   The patient was notified of the below:  Fredirick Maudlin, MD To  Lamy and Delivered  07/13/2019 9:36 AM  Hi [name]--  Your nuclear medicine scan for your gallbladder came back normal, so I don't think we can attribute your symptoms to any kind of gallbladder/biliary pathology. I'm afraid I don't have a good explanation for you as to what might be causing it. I see you had upper endoscopy a few years ago without any findings that might explain it, either. Sorry I don't have a good answer. Let me know if you have any further questions.  Dr. Loletha Grayer     [address]  MyChart User Last Read On  Mandy Roberts Not Read        Patient verbalizes understanding of the above. She states she did see the report in her My Chart. The patient has no further questions at this time.

## 2019-08-18 ENCOUNTER — Other Ambulatory Visit: Payer: Self-pay

## 2019-08-18 ENCOUNTER — Ambulatory Visit (INDEPENDENT_AMBULATORY_CARE_PROVIDER_SITE_OTHER): Payer: 59 | Admitting: Vascular Surgery

## 2019-08-18 ENCOUNTER — Encounter (INDEPENDENT_AMBULATORY_CARE_PROVIDER_SITE_OTHER): Payer: Self-pay | Admitting: Vascular Surgery

## 2019-08-18 VITALS — BP 129/76 | HR 64 | Resp 12 | Ht 66.0 in | Wt 221.0 lb

## 2019-08-18 DIAGNOSIS — I10 Essential (primary) hypertension: Secondary | ICD-10-CM

## 2019-08-18 DIAGNOSIS — I872 Venous insufficiency (chronic) (peripheral): Secondary | ICD-10-CM

## 2019-08-18 DIAGNOSIS — I4891 Unspecified atrial fibrillation: Secondary | ICD-10-CM | POA: Diagnosis not present

## 2019-08-18 DIAGNOSIS — I8311 Varicose veins of right lower extremity with inflammation: Secondary | ICD-10-CM

## 2019-08-18 DIAGNOSIS — I8312 Varicose veins of left lower extremity with inflammation: Secondary | ICD-10-CM

## 2019-08-18 NOTE — Progress Notes (Signed)
MRN : [id number]  Mandy Roberts is a 58 y.o. (08-May-1961) female who presents with chief complaint of  Chief Complaint  Patient presents with  . Follow-up  .  History of Present Illness:   The patient returns for followup evaluation 3 months after the initial visit. The patient continues to have pain in the lower extremities with dependency. The pain is lessened with elevation. Graduated compression stockings, Class I (20-30 mmHg), have been worn but the stockings do not eliminate the leg pain. Over-the-counter analgesics do not improve the symptoms. The degree of discomfort continues to interfere with daily activities. The patient notes the pain in the legs is causing problems with daily exercise, at the workplace and even with household activities and maintenance such as standing in the kitchen preparing meals and doing dishes.   Venous ultrasound shows normal deep venous system, no evidence of acute or chronic DVT.  Superficial reflux is present in the right GSV  Current Meds  Medication Sig  . Cholecalciferol 1.25 MG (50000 UT) capsule cholecalciferol (vitamin D3) 1,250 mcg (50,000 unit) capsule  . clobetasol (OLUX) 0.05 % topical foam Apply topically 2 (two) times daily.  Marland Kitchen ibuprofen (ADVIL) 600 MG tablet Take by mouth.  . Multiple Vitamin (MULTIVITAMIN WITH MINERALS) TABS tablet Take 1 tablet by mouth daily.  . phentermine (ADIPEX-P) 37.5 MG tablet     Past Medical History:  Diagnosis Date  . Atrial fibrillation ([place])   . Breast cancer ([place]) 1990/1997    left mastectomy ,had recurrence in left suprclavic nodes several yrs ago-treated with radiation and chemo  . Colon polyp 2008  . Complication of anesthesia   . Diabetes mellitus without complication (HCC)    history of, loss of 105 lbs  . Difficult intubation    expectorated blood 3-4 days after surgery in 2011  . Difficult intubation    hypoxia with endoscopy in 2016  . History of chemotherapy    5-FU/AC in 1990  under the care of Dr. Oliva Bustard. pt also tx with tamoxifen and arimidex  . History of radiation therapy    rad[facility] treat at [facility]  . Hypertension 2008  . Personal history of chemotherapy 1990   BREAST CA  . Personal history of malignant neoplasm of breast 1990    left mastectomy   . Personal history of radiation therapy 1997   BREAST CA  . Sinusitis   . Sleep apnea    history of, went away after gastric surgery    Past Surgical History:  Procedure Laterality Date  . ABDOMINAL HYSTERECTOMY  1996  . COLONOSCOPY  2008, 2015   Dr. Jamal Collin  . DILATION AND CURETTAGE OF UTERUS    . KNEE ARTHROPLASTY Left 09/07/2017   Procedure: COMPUTER ASSISTED TOTAL KNEE ARTHROPLASTY;  Surgeon: Dereck Leep, MD;  Location: [facility] ORS;  Service: Orthopedics;  Laterality: Left;  . KNEE ARTHROSCOPY Bilateral   . KNEE SURGERY Left 2009, 2010, 2012  . LAPAROSCOPIC GASTRIC RESTRICTIVE DUODENAL PROCEDURE (DUODENAL SWITCH) N/A 04/17/2015   Procedure: LAPAROSCOPIC GASTRIC RESTRICTIVE DUODENAL PROCEDURE, single anastamosis ;  Surgeon: Bonner Puna, MD;  Location: [facility] ORS;  Service: General;  Laterality: N/A;  . MASTECTOMY Left 1990   BREAST CA  . TONSILLECTOMY    . UPPER GI ENDOSCOPY N/A 04/17/2015   Procedure: UPPER GI ENDOSCOPY;  Surgeon: Bonner Puna, MD;  Location: [facility] ORS;  Service: General;  Laterality: N/A;    Social History Social History   Tobacco Use  .  Smoking status: Never Smoker  . Smokeless tobacco: Never Used  Substance Use Topics  . Alcohol use: No  . Drug use: No    Family History Family History  Problem Relation Age of Onset  . Heart failure Mother   . Hypertension Mother   . Hyperlipidemia Mother   . Kidney disease Mother   . CVA Mother   . Hypothyroidism Mother   . Breast cancer Maternal Aunt 76    Allergies  Allergen Reactions  . Tdap [Tetanus-Diphth-Acell Pertussis] Shortness Of Breath  . Penicillins Rash    Has patient had a PCN reaction causing immediate rash,  facial/tongue/throat swelling, SOB or lightheadedness with hypotension: Yes Has patient had a PCN reaction causing severe rash involving mucus membranes or skin necrosis: No Has patient had a PCN reaction that required hospitalization: No Has patient had a PCN reaction occurring within the last 10 years: No If all of the above answers are "NO", then may proceed with Cephalosporin use.      REVIEW OF SYSTEMS (Negative unless checked)  Constitutional: [] Weight loss  [] Fever  [] Chills Cardiac: [] Chest pain   [] Chest pressure   [] Palpitations   [] Shortness of breath when laying flat   [] Shortness of breath with exertion. Vascular:  [] Pain in legs with walking   [x] Pain in legs at rest  [] History of DVT   [] Phlebitis   [x] Swelling in legs   [x] Varicose veins   [] Non-healing ulcers Pulmonary:   [] Uses home oxygen   [] Productive cough   [] Hemoptysis   [] Wheeze  [] COPD   [] Asthma Neurologic:  [] Dizziness   [] Seizures   [] History of stroke   [] History of TIA  [] Aphasia   [] Vissual changes   [] Weakness or numbness in arm   [] Weakness or numbness in leg Musculoskeletal:   [] Joint swelling   [] Joint pain   [] Low back pain Hematologic:  [] Easy bruising  [] Easy bleeding   [] Hypercoagulable state   [] Anemic Gastrointestinal:  [] Diarrhea   [] Vomiting  [] Gastroesophageal reflux/heartburn   [] Difficulty swallowing. Genitourinary:  [] Chronic kidney disease   [] Difficult urination  [] Frequent urination   [] Blood in urine Skin:  [] Rashes   [] Ulcers  Psychological:  [] History of anxiety   []  History of major depression.  Physical Examination  Vitals:   08/18/19 0834  BP: 129/76  Pulse: 64  Resp: 12  Weight: 221 lb (100.2 kg)  Height: 5\' 6"  (1.676 m)   Body mass index is 35.67 kg/m. Gen: WD/WN, NAD Head: [place]/AT, No temporalis wasting.  Ear/Nose/Throat: Hearing grossly intact, nares w/o erythema or drainage Eyes: PER, EOMI, sclera nonicteric.  Neck: Supple, no large masses.   Pulmonary:  Good air  movement, no audible wheezing bilaterally, no use of accessory muscles.  Cardiac: RRR, no JVD Vascular: Large varicosities present extensively greater than 10 mm right.  Mild venous stasis changes to the legs bilaterally.  2+ soft pitting edema Gastrointestinal: Non-distended. No guarding/no peritoneal signs.  Musculoskeletal: M/S 5/5 throughout.  No deformity or atrophy.  Neurologic: CN 2-12 intact. Symmetrical.  Speech is fluent. Motor exam as listed above. Psychiatric: Judgment intact, Mood & affect appropriate for pt's clinical situation. Dermatologic: No rashes or ulcers noted.  No changes consistent with cellulitis. Lymph : No lichenification or skin changes of chronic lymphedema.  CBC Lab Results  Component Value Date   WBC 4.5 11/12/2017   HGB 13.6 11/12/2017   HCT 42.6 11/12/2017   MCV 90.2 11/12/2017   PLT 139 (L) 11/12/2017    BMET    Component  Value Date/Time   NA 140 11/12/2017 0634   NA 137 09/12/2013 1638   K 3.5 11/12/2017 0634   K 3.9 09/12/2013 1638   CL 107 11/12/2017 0634   CL 105 09/12/2013 1638   CO2 27 11/12/2017 0634   CO2 27 09/12/2013 1638   GLUCOSE 102 (H) 11/12/2017 0634   GLUCOSE 148 (H) 09/12/2013 1638   BUN 15 11/12/2017 0634   BUN 9 09/12/2013 1638   CREATININE 0.71 11/12/2017 0634   CREATININE 0.72 09/12/2013 1638   CALCIUM 9.2 11/12/2017 0634   CALCIUM 9.4 09/12/2013 1638   GFRNONAA >60 11/12/2017 0634   GFRNONAA >60 09/12/2013 1638   GFRAA >60 11/12/2017 0634   GFRAA >60 09/12/2013 1638   CrCl cannot be calculated (Patient's most recent lab result is older than the maximum 21 days allowed.).  COAG Lab Results  Component Value Date   INR 1.10 08/26/2017   INR 1.1 11/20/2014    Radiology No results found.   Assessment/Plan 1. Varicose veins of both lower extremities with inflammation Recommend  I have reviewed my previous  discussion with the patient regarding  varicose veins and why they cause symptoms. Patient will  continue  wearing graduated compression stockings class 1 on a daily basis, beginning first thing in the morning and removing them in the evening.    In addition, behavioral modification including elevation during the day was again discussed and this will continue.  The patient has utilized over the counter pain medications and has been exercising.  However, at this time conservative therapy has not alleviated the patient's symptoms of leg pain and swelling  Recommend: laser ablation of the right great saphenous vein to eliminate the symptoms of pain and swelling of the lower extremities caused by the severe superficial venous reflux disease.   2. Chronic venous insufficiency No surgery or intervention at this point in time.    I have had a long discussion with the patient regarding venous insufficiency and why it  causes symptoms. I have discussed with the patient the chronic skin changes that accompany venous insufficiency and the long term sequela such as infection and ulceration.  Patient will begin wearing graduated compression stockings class 1 (20-30 mmHg) or compression wraps on a daily basis a prescription was given. The patient will put the stockings on first thing in the morning and removing them in the evening. The patient is instructed specifically not to sleep in the stockings.    In addition, behavioral modification including several periods of elevation of the lower extremities during the day will be continued. I have demonstrated that proper elevation is a position with the ankles at heart level.  The patient is instructed to begin routine exercise, especially walking on a daily basis  Following the review of the ultrasound the patient will follow up to reassess the degree of swelling and the control that graduated compression stockings or compression wraps  is offering.   The patient can be assessed for a Lymph Pump at that time  3. Atrial fibrillation, unspecified type ([place])  Continue antiarrhythmia medications as already ordered, these medications have been reviewed and there are no changes at this time.  Continue anticoagulation as ordered by Cardiology Service   4. Essential hypertension Continue antihypertensive medications as already ordered, these medications have been reviewed and there are no changes at this time.    Hortencia Pilar, MD  08/18/2019 8:51 AM

## 2019-09-13 ENCOUNTER — Other Ambulatory Visit: Payer: Self-pay

## 2019-09-13 DIAGNOSIS — Z1231 Encounter for screening mammogram for malignant neoplasm of breast: Secondary | ICD-10-CM

## 2019-09-29 ENCOUNTER — Other Ambulatory Visit: Payer: Self-pay | Admitting: General Surgery

## 2019-10-07 ENCOUNTER — Other Ambulatory Visit
Admission: RE | Admit: 2019-10-07 | Discharge: 2019-10-07 | Disposition: A | Discharge status: Home / Self Care | Payer: 59 | Source: Ambulatory Visit | Attending: General Surgery | Admitting: General Surgery

## 2019-10-07 DIAGNOSIS — Z20828 Contact with and (suspected) exposure to other viral communicable diseases: Secondary | ICD-10-CM | POA: Insufficient documentation

## 2019-10-07 DIAGNOSIS — Z01812 Encounter for preprocedural laboratory examination: Secondary | ICD-10-CM | POA: Insufficient documentation

## 2019-10-08 LAB — SARS CORONAVIRUS 2 (TAT 6-24 HRS): SARS Coronavirus 2: NEGATIVE

## 2019-10-12 ENCOUNTER — Ambulatory Visit: Payer: 59 | Admitting: Registered Nurse

## 2019-10-12 ENCOUNTER — Encounter
Admission: RE | Disposition: A | Discharge status: Home / Self Care | Payer: Self-pay | Source: Home / Self Care | Attending: General Surgery

## 2019-10-12 ENCOUNTER — Other Ambulatory Visit: Payer: Self-pay

## 2019-10-12 ENCOUNTER — Other Ambulatory Visit: Payer: Self-pay | Admitting: General Surgery

## 2019-10-12 ENCOUNTER — Ambulatory Visit
Admission: RE | Admit: 2019-10-12 | Discharge: 2019-10-12 | Disposition: A | Discharge status: Home / Self Care | Payer: 59 | Attending: General Surgery | Admitting: General Surgery

## 2019-10-12 DIAGNOSIS — Z79899 Other long term (current) drug therapy: Secondary | ICD-10-CM | POA: Diagnosis not present

## 2019-10-12 DIAGNOSIS — Z9884 Bariatric surgery status: Secondary | ICD-10-CM | POA: Insufficient documentation

## 2019-10-12 DIAGNOSIS — I10 Essential (primary) hypertension: Secondary | ICD-10-CM | POA: Diagnosis not present

## 2019-10-12 DIAGNOSIS — Z880 Allergy status to penicillin: Secondary | ICD-10-CM | POA: Diagnosis not present

## 2019-10-12 DIAGNOSIS — Z1211 Encounter for screening for malignant neoplasm of colon: Secondary | ICD-10-CM | POA: Insufficient documentation

## 2019-10-12 DIAGNOSIS — Z9221 Personal history of antineoplastic chemotherapy: Secondary | ICD-10-CM | POA: Diagnosis not present

## 2019-10-12 DIAGNOSIS — Z9012 Acquired absence of left breast and nipple: Secondary | ICD-10-CM | POA: Diagnosis not present

## 2019-10-12 DIAGNOSIS — E119 Type 2 diabetes mellitus without complications: Secondary | ICD-10-CM | POA: Insufficient documentation

## 2019-10-12 DIAGNOSIS — C801 Malignant (primary) neoplasm, unspecified: Secondary | ICD-10-CM

## 2019-10-12 DIAGNOSIS — Z86010 Personal history of colonic polyps: Secondary | ICD-10-CM | POA: Diagnosis not present

## 2019-10-12 DIAGNOSIS — G4733 Obstructive sleep apnea (adult) (pediatric): Secondary | ICD-10-CM | POA: Diagnosis not present

## 2019-10-12 DIAGNOSIS — Z887 Allergy status to serum and vaccine status: Secondary | ICD-10-CM | POA: Insufficient documentation

## 2019-10-12 DIAGNOSIS — I4891 Unspecified atrial fibrillation: Secondary | ICD-10-CM | POA: Insufficient documentation

## 2019-10-12 DIAGNOSIS — Z853 Personal history of malignant neoplasm of breast: Secondary | ICD-10-CM | POA: Insufficient documentation

## 2019-10-12 DIAGNOSIS — Z6833 Body mass index (BMI) 33.0-33.9, adult: Secondary | ICD-10-CM | POA: Diagnosis not present

## 2019-10-12 HISTORY — PX: COLONOSCOPY WITH PROPOFOL: SHX5780

## 2019-10-12 SURGERY — COLONOSCOPY WITH PROPOFOL
Anesthesia: General

## 2019-10-12 MED ORDER — PROPOFOL 10 MG/ML IV BOLUS
INTRAVENOUS | Status: DC | PRN
  Administered 2019-10-12: 70 mg via INTRAVENOUS

## 2019-10-12 MED ORDER — PROPOFOL 500 MG/50ML IV EMUL
INTRAVENOUS | Status: DC | PRN
  Administered 2019-10-12: 140 ug/kg/min via INTRAVENOUS

## 2019-10-12 MED ORDER — SODIUM CHLORIDE 0.9 % IV SOLN
INTRAVENOUS | Status: DC
  Administered 2019-10-12: 08:00:00 via INTRAVENOUS

## 2019-10-12 NOTE — H&P (Signed)
Mandy Roberts [id number] May 08, 1961     HPI:  58 y/o woman with a history of colon polyps. Negative exam in 2015. For repeat study. Tolerated prep well.   Medications Prior to Admission  Medication Sig Dispense Refill Last Dose  . Cholecalciferol 1.25 MG (50000 UT) capsule cholecalciferol (vitamin D3) 1,250 mcg (50,000 unit) capsule   10/11/2019 at Unknown time  . Multiple Vitamin (MULTIVITAMIN WITH MINERALS) TABS tablet Take 1 tablet by mouth daily.   10/11/2019 at Unknown time  . clobetasol (OLUX) 0.05 % topical foam Apply topically 2 (two) times daily. 50 g 0   . ibuprofen (ADVIL) 600 MG tablet Take by mouth.     . phentermine (ADIPEX-P) 37.5 MG tablet       Allergies  Allergen Reactions  . Tdap [Tetanus-Diphth-Acell Pertussis] Shortness Of Breath  . Penicillins Rash    Has patient had a PCN reaction causing immediate rash, facial/tongue/throat swelling, SOB or lightheadedness with hypotension: Yes Has patient had a PCN reaction causing severe rash involving mucus membranes or skin necrosis: No Has patient had a PCN reaction that required hospitalization: No Has patient had a PCN reaction occurring within the last 10 years: No If all of the above answers are "NO", then may proceed with Cephalosporin use.    Past Medical History:  Diagnosis Date  . Atrial fibrillation ([place])   . Breast cancer ([place]) 1990/1997    left mastectomy ,had recurrence in left suprclavic nodes several yrs ago-treated with radiation and chemo  . Colon polyp 2008  . Complication of anesthesia   . Diabetes mellitus without complication ([facility])    history of, loss of 105 lbs  . Difficult intubation    expectorated blood 3-4 days after surgery in 2011  . Difficult intubation    hypoxia with endoscopy in 2016  . History of chemotherapy    5-FU/AC in 1990 under the care of Dr. Oliva Bustard. pt also tx with tamoxifen and arimidex  . History of radiation therapy    radiation treat at [facility]  . Hypertension 2008  .  Personal history of chemotherapy 1990   BREAST CA  . Personal history of malignant neoplasm of breast 1990    left mastectomy   . Personal history of radiation therapy 1997   BREAST CA  . Sinusitis   . Sleep apnea    history of, went away after gastric surgery   Past Surgical History:  Procedure Laterality Date  . ABDOMINAL HYSTERECTOMY  1996  . COLONOSCOPY  2008, 2015   Dr. Jamal Collin  . DILATION AND CURETTAGE OF UTERUS    . KNEE ARTHROPLASTY Left 09/07/2017   Procedure: COMPUTER ASSISTED TOTAL KNEE ARTHROPLASTY;  Surgeon: Dereck Leep, MD;  Location: [facility] ORS;  Service: Orthopedics;  Laterality: Left;  . KNEE ARTHROSCOPY Bilateral   . KNEE SURGERY Left 2009, 2010, 2012  . LAPAROSCOPIC GASTRIC RESTRICTIVE DUODENAL PROCEDURE (DUODENAL SWITCH) N/A 04/17/2015   Procedure: LAPAROSCOPIC GASTRIC RESTRICTIVE DUODENAL PROCEDURE, single anastamosis ;  Surgeon: Bonner Puna, MD;  Location: [facility] ORS;  Service: General;  Laterality: N/A;  . MASTECTOMY Left 1990   BREAST CA  . TONSILLECTOMY    . UPPER GI ENDOSCOPY N/A 04/17/2015   Procedure: UPPER GI ENDOSCOPY;  Surgeon: Bonner Puna, MD;  Location: [facility] ORS;  Service: General;  Laterality: N/A;   Social History   Socioeconomic History  . Marital status: Widowed    Spouse name: Not on file  . Number of children: Not on file  .  Years of education: Not on file  . Highest education level: Not on file  Occupational History  . Not on file  Social Needs  . Financial resource strain: Not on file  . Food insecurity    Worry: Not on file    Inability: Not on file  . Transportation needs    Medical: Not on file    Non-medical: Not on file  Tobacco Use  . Smoking status: Never Smoker  . Smokeless tobacco: Never Used  Substance and Sexual Activity  . Alcohol use: No  . Drug use: No  . Sexual activity: Not on file  Lifestyle  . Physical activity    Days per week: Not on file    Minutes per session: Not on file  . Stress: Not on file   Relationships  . Social Herbalist on phone: Not on file    Gets together: Not on file    Attends religious service: Not on file    Active member of club or organization: Not on file    Attends meetings of clubs or organizations: Not on file    Relationship status: Not on file  . Intimate partner violence    Fear of current or ex partner: Not on file    Emotionally abused: Not on file    Physically abused: Not on file    Forced sexual activity: Not on file  Other Topics Concern  . Not on file  Social History Narrative  . Not on file   Social History   Social History Narrative  . Not on file     ROS: Negative.     PE: HEENT: Negative. Lungs: Clear. Cardio: RR...   Assessment/Plan:  Proceed with planned endoscopy. Forest Gleason Tarez Bowns 10/12/2019

## 2019-10-12 NOTE — Anesthesia Postprocedure Evaluation (Signed)
Anesthesia Post Note  Patient: Mandy Roberts  Procedure(s) Performed: COLONOSCOPY WITH PROPOFOL (N/A )  Patient location during evaluation: PACU Anesthesia Type: General Level of consciousness: awake and alert Pain management: pain level controlled Vital Signs Assessment: post-procedure vital signs reviewed and stable Respiratory status: spontaneous breathing, nonlabored ventilation and respiratory function stable Cardiovascular status: blood pressure returned to baseline and stable Postop Assessment: no apparent nausea or vomiting Anesthetic complications: no     Last Vitals:  Vitals:   10/12/19 0943 10/12/19 1018  BP: (!) 104/57 (!) 148/83  Pulse: 85 (!) 59  Resp: (!) 21 15  Temp: (!) 36.4 C   SpO2: 99% 100%    Last Pain:  Vitals:   10/12/19 1018  TempSrc:   PainSc: 0-No pain                 Durenda Hurt

## 2019-10-12 NOTE — Transfer of Care (Signed)
Immediate Anesthesia Transfer of Care Note  Patient: Mandy Roberts  Procedure(s) Performed: COLONOSCOPY WITH PROPOFOL (N/A )  Patient Location: PACU  Anesthesia Type:General  Level of Consciousness: sedated  Airway & Oxygen Therapy: Patient Spontanous Breathing  Post-op Assessment: Report given to RN and Post -op Vital signs reviewed and stable  Post vital signs: Reviewed and stable  Last Vitals:  Vitals Value Taken Time  BP 104/57 10/12/19 0943  Temp 36.4 C 10/12/19 0943  Pulse 77 10/12/19 0944  Resp 19 10/12/19 0944  SpO2 100 % 10/12/19 0944  Vitals shown include unvalidated device data.  Last Pain:  Vitals:   10/12/19 0943  TempSrc: Tympanic  PainSc: 0-No pain         Complications: No apparent anesthesia complications

## 2019-10-12 NOTE — Anesthesia Preprocedure Evaluation (Addendum)
Anesthesia Evaluation  Patient identified by MRN, date of birth, ID band Patient awake    Reviewed: Allergy & Precautions, H&P , NPO status , Patient's Chart, lab work & pertinent test results  History of Anesthesia Complications (+) DIFFICULT AIRWAY and history of anesthetic complications  Airway Mallampati: I  TM Distance: <3 FB Neck ROM: full   Comment: H/o neck radiation right side of neck Dental   Pulmonary sleep apnea ,           Cardiovascular hypertension, + dysrhythmias Atrial Fibrillation      Neuro/Psych negative neurological ROS  negative psych ROS   GI/Hepatic negative GI ROS, Neg liver ROS,   Endo/Other  diabetes  Renal/GU negative Renal ROS  negative genitourinary   Musculoskeletal   Abdominal   Peds  Hematology negative hematology ROS (+)   Anesthesia Other Findings Past Medical History: No date: Atrial fibrillation [facility]) 1990/1997: Breast cancer ([place])     Comment:   left mastectomy ,had recurrence in left suprclavic               nodes several yrs ago-treated with radiation and chemo 2008: Colon polyp No date: Complication of anesthesia No date: Diabetes mellitus without complication ([facility])     Comment:  history of, loss of 105 lbs No date: Difficult intubation     Comment:  expectorated blood 3-4 days after surgery in 2011 No date: Difficult intubation     Comment:  hypoxia with endoscopy in 2016 No date: History of chemotherapy     Comment:  5-FU/AC in 1990 under the care of Dr. Oliva Bustard. pt also tx              with tamoxifen and arimidex No date: History of radiation therapy     Comment:  radiation treat at [facility] 2008: Hypertension 1990: Personal history of chemotherapy     Comment:  BREAST CA 1990: Personal history of malignant neoplasm of breast     Comment:   left mastectomy  1997: Personal history of radiation therapy     Comment:  BREAST CA No date: Sinusitis No date: Sleep apnea     Comment:  history of, went away after gastric surgery  Past Surgical History: 1996: ABDOMINAL HYSTERECTOMY 2008, 2015: COLONOSCOPY     Comment:  Dr. Jamal Collin No date: [facility] UTERUS 09/07/2017: KNEE ARTHROPLASTY; Left     Comment:  Procedure: COMPUTER ASSISTED TOTAL KNEE ARTHROPLASTY;                Surgeon: Dereck Leep, MD;  Location: [facility] ORS;                Service: Orthopedics;  Laterality: Left; No date: KNEE ARTHROSCOPY; Bilateral 2009, 2010, 2012: [place]; Left 04/17/2015: LAPAROSCOPIC GASTRIC RESTRICTIVE DUODENAL PROCEDURE  (DUODENAL SWITCH); N/A     Comment:  Procedure: LAPAROSCOPIC GASTRIC RESTRICTIVE DUODENAL               PROCEDURE, single anastamosis ;  Surgeon: Bonner Puna,               MD;  Location: [facility] ORS;  Service: General;  Laterality:               N/A; 1990: MASTECTOMY; Left     Comment:  BREAST CA No date: TONSILLECTOMY 04/17/2015: UPPER GI ENDOSCOPY; N/A     Comment:  Procedure: UPPER GI ENDOSCOPY;  Surgeon: Bonner Puna,  MD;  Location: [facility] ORS;  Service: General;  Laterality:               N/A;  BMI    Body Mass Index: 33.71 kg/m      Reproductive/Obstetrics negative OB ROS                            Anesthesia Physical Anesthesia Plan  ASA: III  Anesthesia Plan: General   Post-op Pain Management:    Induction:   PONV Risk Score and Plan: Propofol infusion and TIVA  Airway Management Planned: Natural Airway and Simple Face Mask  Additional Equipment:   Intra-op Plan:   Post-operative Plan:   Informed Consent: I have reviewed the patients History and Physical, chart, labs and discussed the procedure including the risks, benefits and alternatives for the proposed anesthesia with the patient or authorized representative who has indicated his/her understanding and acceptance.     Dental Advisory Given  Plan Discussed with: Anesthesiologist, CRNA and Surgeon  Anesthesia  Plan Comments:         Anesthesia Quick Evaluation

## 2019-10-12 NOTE — Op Note (Signed)
[facility] Patient Name: Mandy Roberts Procedure Date: 10/12/2019 8:52 AM MRN: [id number] Account #: [id number] Date of Birth: 1961/08/01 Admit Type: Outpatient Age: 58 Room: [facility] ENDO ROOM 1 Gender: Female Note Status: Finalized Procedure:             Colonoscopy Indications:           High risk colon cancer surveillance: Personal history                         of colonic polyps Providers:             Robert Bellow, MD Medicines:             Monitored Anesthesia Care Complications:         No immediate complications. Procedure:             Pre-Anesthesia Assessment:                        - Prior to the procedure, a History and Physical was                         performed, and patient medications, allergies and                         sensitivities were reviewed. The patient's tolerance                         of previous anesthesia was reviewed.                        - The risks and benefits of the procedure and the                         sedation options and risks were discussed with the                         patient. All questions were answered and informed                         consent was obtained.                        After obtaining informed consent, the colonoscope was                         passed under direct vision. Throughout the procedure,                         the patient's blood pressure, pulse, and oxygen                         saturations were monitored continuously. The                         Colonoscope was introduced through the anus and                         advanced to the the cecum, identified by appendiceal  orifice and ileocecal valve. The colonoscopy was                         somewhat difficult due to significant looping and a                         tortuous colon. Successful completion of the procedure                         was aided by changing the patient to a prone  position.                         The patient tolerated the procedure well. The quality                         of the bowel preparation was excellent. Suspected                         polyp in the rectum was found to be related to suction. Findings:      The entire examined colon appeared normal on direct and retroflexion       views. Impression:            - The entire examined colon is normal on direct and                         retroflexion views.                        - No specimens collected. Recommendation:        - Repeat colonoscopy in 5 years for surveillance. Procedure Code(s):     --- Professional ---                        [phone], Colonoscopy, flexible; diagnostic, including                         collection of specimen(s) by brushing or washing, when                         performed (separate procedure) Diagnosis Code(s):     --- Professional ---                        Z86.010, Personal history of colonic polyps CPT copyright 2019 [employer]. All rights reserved. The codes documented in this report are preliminary and upon coder review may  be revised to meet current compliance requirements. Robert Bellow, MD 10/12/2019 9:41:31 AM This report has been signed electronically. Number of Addenda: 0 Note Initiated On: 10/12/2019 8:52 AM Scope Withdrawal Time: 0 hours 16 minutes 28 seconds  Total Procedure Duration: 0 hours 36 minutes 16 seconds       [facility]

## 2019-10-12 NOTE — Anesthesia Post-op Follow-up Note (Signed)
Anesthesia QCDR form completed.        

## 2019-10-13 ENCOUNTER — Encounter: Payer: Self-pay | Admitting: General Surgery

## 2019-10-13 DIAGNOSIS — M2550 Pain in unspecified joint: Secondary | ICD-10-CM | POA: Diagnosis not present

## 2019-10-13 DIAGNOSIS — E559 Vitamin D deficiency, unspecified: Secondary | ICD-10-CM | POA: Diagnosis not present

## 2019-11-08 DIAGNOSIS — C50912 Malignant neoplasm of unspecified site of left female breast: Secondary | ICD-10-CM | POA: Diagnosis not present

## 2019-11-09 DIAGNOSIS — H52223 Regular astigmatism, bilateral: Secondary | ICD-10-CM | POA: Diagnosis not present

## 2019-11-09 DIAGNOSIS — H5213 Myopia, bilateral: Secondary | ICD-10-CM | POA: Diagnosis not present

## 2019-11-09 DIAGNOSIS — H524 Presbyopia: Secondary | ICD-10-CM | POA: Diagnosis not present

## 2019-11-10 ENCOUNTER — Other Ambulatory Visit (INDEPENDENT_AMBULATORY_CARE_PROVIDER_SITE_OTHER): Payer: 59 | Admitting: Vascular Surgery

## 2019-11-11 ENCOUNTER — Other Ambulatory Visit: Payer: Self-pay

## 2019-11-11 ENCOUNTER — Ambulatory Visit
Admission: RE | Admit: 2019-11-11 | Discharge: 2019-11-11 | Disposition: A | Discharge status: Home / Self Care | Payer: 59 | Source: Ambulatory Visit | Attending: General Surgery | Admitting: General Surgery

## 2019-11-11 DIAGNOSIS — Z1231 Encounter for screening mammogram for malignant neoplasm of breast: Secondary | ICD-10-CM | POA: Diagnosis not present

## 2019-11-14 ENCOUNTER — Encounter (INDEPENDENT_AMBULATORY_CARE_PROVIDER_SITE_OTHER): Payer: 59

## 2019-11-14 ENCOUNTER — Other Ambulatory Visit (INDEPENDENT_AMBULATORY_CARE_PROVIDER_SITE_OTHER): Payer: Self-pay | Admitting: Vascular Surgery

## 2019-11-14 ENCOUNTER — Encounter (INDEPENDENT_AMBULATORY_CARE_PROVIDER_SITE_OTHER): Payer: Self-pay | Admitting: Vascular Surgery

## 2019-11-14 ENCOUNTER — Other Ambulatory Visit: Payer: Self-pay

## 2019-11-14 ENCOUNTER — Ambulatory Visit (INDEPENDENT_AMBULATORY_CARE_PROVIDER_SITE_OTHER): Payer: 59 | Admitting: Vascular Surgery

## 2019-11-14 VITALS — BP 152/85 | HR 73 | Resp 16 | Wt 220.0 lb

## 2019-11-14 DIAGNOSIS — I8311 Varicose veins of right lower extremity with inflammation: Secondary | ICD-10-CM

## 2019-11-14 DIAGNOSIS — I8312 Varicose veins of left lower extremity with inflammation: Secondary | ICD-10-CM

## 2019-11-16 ENCOUNTER — Ambulatory Visit (INDEPENDENT_AMBULATORY_CARE_PROVIDER_SITE_OTHER): Payer: 59

## 2019-11-16 ENCOUNTER — Other Ambulatory Visit: Payer: Self-pay

## 2019-11-16 DIAGNOSIS — I8311 Varicose veins of right lower extremity with inflammation: Secondary | ICD-10-CM | POA: Diagnosis not present

## 2019-11-16 DIAGNOSIS — I8312 Varicose veins of left lower extremity with inflammation: Secondary | ICD-10-CM | POA: Diagnosis not present

## 2019-11-22 ENCOUNTER — Ambulatory Visit: Payer: 59 | Admitting: General Surgery

## 2019-11-22 DIAGNOSIS — Z853 Personal history of malignant neoplasm of breast: Secondary | ICD-10-CM | POA: Diagnosis not present

## 2019-11-27 ENCOUNTER — Encounter (INDEPENDENT_AMBULATORY_CARE_PROVIDER_SITE_OTHER): Payer: Self-pay | Admitting: Vascular Surgery

## 2019-11-27 NOTE — Progress Notes (Signed)
    MRN : [id number]  MORAG RADEMACHER is a 59 y.o. (10/04/1961) female who presents with chief complaint of painful varicose veins.    The patient's right lower extremity was sterilely prepped and draped.  The ultrasound machine was used to visualize the right great saphenous vein throughout its course.  A segment in the mid thigh was selected for access.  The saphenous vein was accessed without difficulty using ultrasound guidance with a micropuncture needle.   An 0.018  wire was placed beyond the saphenofemoral junction through the sheath and the microneedle was removed.  The 65 cm sheath was then placed over the wire and the wire and dilator were removed.  The laser fiber was placed through the sheath and its tip was placed approximately 2 cm below the saphenofemoral junction.  Tumescent anesthesia was then created with a dilute lidocaine solution.  Laser energy was then delivered with constant withdrawal of the sheath and laser fiber.  Approximately 200 Joules of energy were delivered over a length of 6 cm.  Sterile dressings were placed.  The patient tolerated the procedure well without complications.

## 2019-12-12 ENCOUNTER — Ambulatory Visit (INDEPENDENT_AMBULATORY_CARE_PROVIDER_SITE_OTHER): Payer: 59 | Admitting: Vascular Surgery

## 2020-01-13 DIAGNOSIS — R7302 Impaired glucose tolerance (oral): Secondary | ICD-10-CM | POA: Diagnosis not present

## 2020-01-13 DIAGNOSIS — E668 Other obesity: Secondary | ICD-10-CM | POA: Diagnosis not present

## 2020-01-13 DIAGNOSIS — F411 Generalized anxiety disorder: Secondary | ICD-10-CM | POA: Diagnosis not present

## 2020-01-13 DIAGNOSIS — E559 Vitamin D deficiency, unspecified: Secondary | ICD-10-CM | POA: Diagnosis not present

## 2020-02-09 ENCOUNTER — Encounter: Payer: Self-pay | Admitting: *Deleted

## 2020-05-11 ENCOUNTER — Other Ambulatory Visit: Payer: Self-pay | Admitting: Internal Medicine

## 2020-05-11 DIAGNOSIS — I10 Essential (primary) hypertension: Secondary | ICD-10-CM | POA: Diagnosis not present

## 2020-05-11 DIAGNOSIS — Z0000 Encounter for general adult medical examination without abnormal findings: Secondary | ICD-10-CM | POA: Diagnosis not present

## 2020-05-11 DIAGNOSIS — F411 Generalized anxiety disorder: Secondary | ICD-10-CM | POA: Diagnosis not present

## 2020-05-11 DIAGNOSIS — E782 Mixed hyperlipidemia: Secondary | ICD-10-CM | POA: Diagnosis not present

## 2020-05-11 DIAGNOSIS — E559 Vitamin D deficiency, unspecified: Secondary | ICD-10-CM | POA: Diagnosis not present

## 2020-05-11 DIAGNOSIS — R7302 Impaired glucose tolerance (oral): Secondary | ICD-10-CM | POA: Diagnosis not present

## 2020-05-11 DIAGNOSIS — E669 Obesity, unspecified: Secondary | ICD-10-CM | POA: Diagnosis not present

## 2020-05-11 DIAGNOSIS — I340 Nonrheumatic mitral (valve) insufficiency: Secondary | ICD-10-CM | POA: Diagnosis not present

## 2020-05-26 ENCOUNTER — Inpatient Hospital Stay
Admission: EM | Admit: 2020-05-26 | Discharge: 2020-05-29 | DRG: 863 | Disposition: A | Discharge status: Home / Self Care | Payer: 59 | Attending: Internal Medicine | Admitting: Internal Medicine

## 2020-05-26 ENCOUNTER — Encounter: Payer: Self-pay | Admitting: Emergency Medicine

## 2020-05-26 ENCOUNTER — Other Ambulatory Visit: Payer: Self-pay

## 2020-05-26 ENCOUNTER — Emergency Department: Payer: 59

## 2020-05-26 DIAGNOSIS — E119 Type 2 diabetes mellitus without complications: Secondary | ICD-10-CM | POA: Diagnosis present

## 2020-05-26 DIAGNOSIS — Z853 Personal history of malignant neoplasm of breast: Secondary | ICD-10-CM

## 2020-05-26 DIAGNOSIS — T8143XA Infection following a procedure, organ and space surgical site, initial encounter: Secondary | ICD-10-CM | POA: Diagnosis not present

## 2020-05-26 DIAGNOSIS — K047 Periapical abscess without sinus: Secondary | ICD-10-CM | POA: Diagnosis not present

## 2020-05-26 DIAGNOSIS — E669 Obesity, unspecified: Secondary | ICD-10-CM | POA: Diagnosis present

## 2020-05-26 DIAGNOSIS — Z90710 Acquired absence of both cervix and uterus: Secondary | ICD-10-CM

## 2020-05-26 DIAGNOSIS — Z8249 Family history of ischemic heart disease and other diseases of the circulatory system: Secondary | ICD-10-CM

## 2020-05-26 DIAGNOSIS — M272 Inflammatory conditions of jaws: Secondary | ICD-10-CM

## 2020-05-26 DIAGNOSIS — Z9221 Personal history of antineoplastic chemotherapy: Secondary | ICD-10-CM

## 2020-05-26 DIAGNOSIS — D649 Anemia, unspecified: Secondary | ICD-10-CM | POA: Diagnosis present

## 2020-05-26 DIAGNOSIS — Z6833 Body mass index (BMI) 33.0-33.9, adult: Secondary | ICD-10-CM

## 2020-05-26 DIAGNOSIS — H9202 Otalgia, left ear: Secondary | ICD-10-CM | POA: Diagnosis not present

## 2020-05-26 DIAGNOSIS — L089 Local infection of the skin and subcutaneous tissue, unspecified: Secondary | ICD-10-CM | POA: Diagnosis not present

## 2020-05-26 DIAGNOSIS — Z9012 Acquired absence of left breast and nipple: Secondary | ICD-10-CM | POA: Diagnosis not present

## 2020-05-26 DIAGNOSIS — Z880 Allergy status to penicillin: Secondary | ICD-10-CM

## 2020-05-26 DIAGNOSIS — L03211 Cellulitis of face: Secondary | ICD-10-CM

## 2020-05-26 DIAGNOSIS — Z20822 Contact with and (suspected) exposure to covid-19: Secondary | ICD-10-CM | POA: Diagnosis not present

## 2020-05-26 DIAGNOSIS — Z96652 Presence of left artificial knee joint: Secondary | ICD-10-CM | POA: Diagnosis present

## 2020-05-26 DIAGNOSIS — D696 Thrombocytopenia, unspecified: Secondary | ICD-10-CM | POA: Diagnosis present

## 2020-05-26 DIAGNOSIS — Z803 Family history of malignant neoplasm of breast: Secondary | ICD-10-CM

## 2020-05-26 DIAGNOSIS — I10 Essential (primary) hypertension: Secondary | ICD-10-CM | POA: Diagnosis not present

## 2020-05-26 DIAGNOSIS — Z841 Family history of disorders of kidney and ureter: Secondary | ICD-10-CM

## 2020-05-26 DIAGNOSIS — Z03818 Encounter for observation for suspected exposure to other biological agents ruled out: Secondary | ICD-10-CM | POA: Diagnosis not present

## 2020-05-26 DIAGNOSIS — I4581 Long QT syndrome: Secondary | ICD-10-CM | POA: Diagnosis not present

## 2020-05-26 DIAGNOSIS — Y848 Other medical procedures as the cause of abnormal reaction of the patient, or of later complication, without mention of misadventure at the time of the procedure: Secondary | ICD-10-CM | POA: Diagnosis present

## 2020-05-26 DIAGNOSIS — Z83438 Family history of other disorder of lipoprotein metabolism and other lipidemia: Secondary | ICD-10-CM

## 2020-05-26 DIAGNOSIS — Z823 Family history of stroke: Secondary | ICD-10-CM

## 2020-05-26 DIAGNOSIS — Z887 Allergy status to serum and vaccine status: Secondary | ICD-10-CM

## 2020-05-26 DIAGNOSIS — Z923 Personal history of irradiation: Secondary | ICD-10-CM

## 2020-05-26 LAB — COMPREHENSIVE METABOLIC PANEL
ALT: 21 U/L (ref 0–44)
AST: 17 U/L (ref 15–41)
Albumin: 3.9 g/dL (ref 3.5–5.0)
Alkaline Phosphatase: 114 U/L (ref 38–126)
Anion gap: 7 (ref 5–15)
BUN: 11 mg/dL (ref 6–20)
CO2: 27 mmol/L (ref 22–32)
Calcium: 9.1 mg/dL (ref 8.9–10.3)
Chloride: 105 mmol/L (ref 98–111)
Creatinine, Ser: 0.67 mg/dL (ref 0.44–1.00)
GFR calc Af Amer: >60 mL/min (ref >60)
GFR calc non Af Amer: >60 mL/min (ref >60)
Glucose, Bld: 103 mg/dL — ABNORMAL HIGH (ref 70–99)
Potassium: 4.2 mmol/L (ref 3.5–5.1)
Sodium: 139 mmol/L (ref 135–145)
Total Bilirubin: 0.9 mg/dL (ref 0.3–1.2)
Total Protein: 7.4 g/dL (ref 6.5–8.1)

## 2020-05-26 LAB — CBC WITH DIFFERENTIAL/PLATELET
Abs Immature Granulocytes: 0.02 10*3/uL (ref 0.00–0.07)
Basophils Absolute: 0 10*3/uL (ref 0.0–0.1)
Basophils Relative: 0 %
Eosinophils Absolute: 0.1 10*3/uL (ref 0.0–0.5)
Eosinophils Relative: 1 %
HCT: 41.5 % (ref 36.0–46.0)
Hemoglobin: 13.6 g/dL (ref 12.0–15.0)
Immature Granulocytes: 0 %
Lymphocytes Relative: 11 %
Lymphs Abs: 0.9 10*3/uL (ref 0.7–4.0)
MCH: 29.8 pg (ref 26.0–34.0)
MCHC: 32.8 g/dL (ref 30.0–36.0)
MCV: 90.8 fL (ref 80.0–100.0)
Monocytes Absolute: 0.7 10*3/uL (ref 0.1–1.0)
Monocytes Relative: 8 %
Neutro Abs: 6.9 10*3/uL (ref 1.7–7.7)
Neutrophils Relative %: 80 %
Platelets: 119 10*3/uL — ABNORMAL LOW (ref 150–400)
RBC: 4.57 MIL/uL (ref 3.87–5.11)
RDW: 12.8 % (ref 11.5–15.5)
Smear Review: NORMAL
WBC: 8.6 10*3/uL (ref 4.0–10.5)
nRBC: 0 % (ref 0.0–0.2)

## 2020-05-26 LAB — SARS CORONAVIRUS 2 BY RT PCR (HOSPITAL ORDER, PERFORMED IN ~~LOC~~ HOSPITAL LAB): SARS Coronavirus 2: NEGATIVE

## 2020-05-26 LAB — C-REACTIVE PROTEIN: CRP: 7.3 mg/dL — ABNORMAL HIGH (ref <1.0)

## 2020-05-26 LAB — SEDIMENTATION RATE: Sed Rate: 23 mm/hr (ref 0–30)

## 2020-05-26 LAB — HIV ANTIBODY (ROUTINE TESTING W REFLEX): HIV Screen 4th Generation wRfx: NONREACTIVE

## 2020-05-26 MED ORDER — DEXAMETHASONE SODIUM PHOSPHATE 10 MG/ML IJ SOLN
10.0000 mg | Freq: Once | INTRAMUSCULAR | Status: AC
  Administered 2020-05-26: 10 mg via INTRAVENOUS
  Filled 2020-05-26: qty 1

## 2020-05-26 MED ORDER — LIDOCAINE-EPINEPHRINE 2 %-1:100000 IJ SOLN
20.0000 mL | Freq: Once | INTRAMUSCULAR | Status: AC
  Administered 2020-05-26: 20 mL
  Filled 2020-05-26: qty 1

## 2020-05-26 MED ORDER — CLINDAMYCIN PHOSPHATE 600 MG/50ML IV SOLN
600.0000 mg | Freq: Three times a day (TID) | INTRAVENOUS | Status: DC
  Filled 2020-05-26 (×3): qty 50

## 2020-05-26 MED ORDER — OXYCODONE-ACETAMINOPHEN 5-325 MG PO TABS
1.0000 | ORAL_TABLET | ORAL | Status: DC | PRN
  Administered 2020-05-26 – 2020-05-28 (×5): 1 via ORAL
  Filled 2020-05-26 (×5): qty 1

## 2020-05-26 MED ORDER — HYDROMORPHONE HCL 1 MG/ML IJ SOLN
1.0000 mg | Freq: Once | INTRAMUSCULAR | Status: AC | PRN
  Administered 2020-05-26: 1 mg via INTRAVENOUS
  Filled 2020-05-26: qty 1

## 2020-05-26 MED ORDER — SODIUM CHLORIDE 0.9 % IV SOLN
INTRAVENOUS | Status: DC | PRN
  Administered 2020-05-26: 500 mL via INTRAVENOUS

## 2020-05-26 MED ORDER — ONDANSETRON HCL 4 MG/2ML IJ SOLN
4.0000 mg | Freq: Once | INTRAMUSCULAR | Status: AC
  Administered 2020-05-26: 4 mg via INTRAVENOUS
  Filled 2020-05-26: qty 2

## 2020-05-26 MED ORDER — HYDRALAZINE HCL 20 MG/ML IJ SOLN
5.0000 mg | INTRAMUSCULAR | Status: DC | PRN
  Filled 2020-05-26: qty 0.25

## 2020-05-26 MED ORDER — MORPHINE SULFATE (PF) 2 MG/ML IV SOLN
2.0000 mg | INTRAVENOUS | Status: DC | PRN
  Administered 2020-05-26: 2 mg via INTRAVENOUS
  Filled 2020-05-26: qty 1

## 2020-05-26 MED ORDER — HYDROMORPHONE HCL 1 MG/ML IJ SOLN
0.5000 mg | Freq: Once | INTRAMUSCULAR | Status: AC
  Administered 2020-05-26: 0.5 mg via INTRAVENOUS
  Filled 2020-05-26: qty 1

## 2020-05-26 MED ORDER — SODIUM CHLORIDE 0.9 % IV SOLN
3.0000 g | Freq: Four times a day (QID) | INTRAVENOUS | Status: DC
  Administered 2020-05-26 – 2020-05-29 (×12): 3 g via INTRAVENOUS
  Filled 2020-05-26 (×4): qty 8
  Filled 2020-05-26: qty 3
  Filled 2020-05-26: qty 8
  Filled 2020-05-26: qty 3
  Filled 2020-05-26 (×3): qty 8
  Filled 2020-05-26: qty 3
  Filled 2020-05-26 (×2): qty 8
  Filled 2020-05-26: qty 3

## 2020-05-26 MED ORDER — HYDROMORPHONE HCL 1 MG/ML IJ SOLN
1.0000 mg | Freq: Once | INTRAMUSCULAR | Status: AC
  Administered 2020-05-26: 1 mg via INTRAVENOUS
  Filled 2020-05-26: qty 1

## 2020-05-26 MED ORDER — ADULT MULTIVITAMIN W/MINERALS CH
1.0000 | ORAL_TABLET | Freq: Every day | ORAL | Status: DC
  Filled 2020-05-26 (×3): qty 1

## 2020-05-26 MED ORDER — CLINDAMYCIN PHOSPHATE 600 MG/50ML IV SOLN
600.0000 mg | Freq: Once | INTRAVENOUS | Status: AC
  Administered 2020-05-26: 600 mg via INTRAVENOUS
  Filled 2020-05-26: qty 50

## 2020-05-26 MED ORDER — SENNOSIDES-DOCUSATE SODIUM 8.6-50 MG PO TABS: 1.0000 | ORAL_TABLET | Freq: Every evening | ORAL | Status: DC | PRN

## 2020-05-26 MED ORDER — ACETAMINOPHEN 325 MG PO TABS: 650.0000 mg | ORAL_TABLET | Freq: Four times a day (QID) | ORAL | Status: DC | PRN

## 2020-05-26 MED ORDER — ONDANSETRON HCL 4 MG/2ML IJ SOLN
4.0000 mg | Freq: Three times a day (TID) | INTRAMUSCULAR | Status: DC | PRN
  Administered 2020-05-26: 4 mg via INTRAVENOUS
  Filled 2020-05-26: qty 2

## 2020-05-26 MED ORDER — IOHEXOL 300 MG/ML  SOLN
75.0000 mL | Freq: Once | INTRAMUSCULAR | Status: AC | PRN
  Administered 2020-05-26: 75 mL via INTRAVENOUS

## 2020-05-26 NOTE — Progress Notes (Signed)
Pharmacy Antibiotic Note  Mandy Roberts is a 59 y.o. female admitted on 05/26/2020 with cellulitis with facial abscess.  Pharmacy has been consulted for Unasyn  dosing.  Plan: Unasyn 3 gm IV Q6H to start on 7/3 @ 1700.  Height: 5\' 6"  (167.6 cm) Weight: 93.9 kg (207 lb) IBW/kg (Calculated) : 59.3  Temp (24hrs), Avg:98.6 F (37 C), Min:98.2 F (36.8 C), Max:99 F (37.2 C)  Recent Labs  Lab 05/26/20 1054  WBC 8.6  CREATININE 0.67    Estimated Creatinine Clearance: 87.4 mL/min (by C-G formula based on SCr of 0.67 mg/dL).    Allergies  Allergen Reactions  . Tdap [Tetanus-Diphth-Acell Pertussis] Shortness Of Breath  . Penicillins Rash    Has patient had a PCN reaction causing immediate rash, facial/tongue/throat swelling, SOB or lightheadedness with hypotension: Yes Has patient had a PCN reaction causing severe rash involving mucus membranes or skin necrosis: No Has patient had a PCN reaction that required hospitalization: No Has patient had a PCN reaction occurring within the last 10 years: No If all of the above answers are "NO", then may proceed with Cephalosporin use.     Antimicrobials this admission:   >>    >>   Dose adjustments this admission:   Microbiology results:  BCx:   UCx:    Sputum:    MRSA PCR:   Thank you for allowing pharmacy to be a part of this patient's care.  Corbett Moulder D 05/26/2020 4:54 PM

## 2020-05-26 NOTE — ED Provider Notes (Signed)
[facility] Emergency Department Provider Note  ____________________________________________   First MD Initiated Contact with Patient 05/26/20 1015     (approximate)  I have reviewed the triage vital signs and the nursing notes.   HISTORY  Chief Complaint Facial Swelling    HPI Mandy Roberts is a 59 y.o. female with A. fib, diabetes, status post wisdom tooth extraction on Wednesday who comes in with facial swelling.  Patient works at our hospital and actually saw patient yesterday in the hallway and since yesterday her face has about doubled in size.  She got significant swelling to the left side of her jaw that is severe, constant, nothing makes it better, nothing makes it worse.  She has decreased mouth opening from yesterday as well.  She is tolerating secretions but does have significant pain.  Denies any chest pain, shortness of breath, abdominal pain or any other concerns          Past Medical History:  Diagnosis Date  . Atrial fibrillation ([place])   . Breast cancer ([place]) 1990/1997    left mastectomy ,had recurrence in left suprclavic nodes several yrs ago-treated with radiation and chemo  . Colon polyp 2008  . Complication of anesthesia   . Diabetes mellitus without complication (HCC)    history of, loss of 105 lbs  . Difficult intubation    expectorated blood 3-4 days after surgery in 2011  . Difficult intubation    hypoxia with endoscopy in 2016  . History of chemotherapy    5-FU/AC in 1990 under the care of Dr. Oliva Bustard. pt also tx with tamoxifen and arimidex  . History of radiation therapy    radiation treat at [facility]  . Hypertension 2008  . Personal history of chemotherapy 1990   BREAST CA  . Personal history of malignant neoplasm of breast 1990    left mastectomy   . Personal history of radiation therapy 1997   BREAST CA  . Sinusitis   . Sleep apnea    history of, went away after gastric surgery    Patient Active Problem List     Diagnosis Date Noted  . Varicose veins of both lower extremities with inflammation 05/12/2019  . Chronic venous insufficiency 05/12/2019  . Status post total left knee replacement 09/07/2017  . Chronic bilateral thoracic back pain 09/01/2017  . Neck pain 09/01/2017  . Symptomatic mammary hypertrophy 09/01/2017  . Difficult airway for intubation 04/09/2015  . Chronic nasal congestion 04/09/2015  . Atrial fibrillation ([place]) 09/16/2013  . Obstructive sleep apnea 09/16/2013  . Morbid obesity ([place]) 09/16/2013  . Personal history of breast cancer 02/09/2013  . Hypertension   . Cancer [facility])     Past Surgical History:  Procedure Laterality Date  . ABDOMINAL HYSTERECTOMY  1996  . COLONOSCOPY  2008, 2015   Dr. Jamal Collin  . COLONOSCOPY WITH PROPOFOL N/A 10/12/2019   Procedure: COLONOSCOPY WITH PROPOFOL;  Surgeon: Robert Bellow, MD;  Location: [facility] ENDOSCOPY;  Service: Endoscopy;  Laterality: N/A;  DR BYRNETT WILL UPDATE H&P ON PROCEDURE DATE  . DILATION AND CURETTAGE OF UTERUS    . KNEE ARTHROPLASTY Left 09/07/2017   Procedure: COMPUTER ASSISTED TOTAL KNEE ARTHROPLASTY;  Surgeon: Dereck Leep, MD;  Location: [facility] ORS;  Service: Orthopedics;  Laterality: Left;  . KNEE ARTHROSCOPY Bilateral   . KNEE SURGERY Left 2009, 2010, 2012  . LAPAROSCOPIC GASTRIC RESTRICTIVE DUODENAL PROCEDURE (DUODENAL SWITCH) N/A 04/17/2015   Procedure: LAPAROSCOPIC GASTRIC RESTRICTIVE DUODENAL PROCEDURE, single anastamosis ;  Surgeon: Bonner Puna, MD;  Location: [facility] ORS;  Service: General;  Laterality: N/A;  . MASTECTOMY Left 1990   BREAST CA  . TONSILLECTOMY    . UPPER GI ENDOSCOPY N/A 04/17/2015   Procedure: UPPER GI ENDOSCOPY;  Surgeon: Bonner Puna, MD;  Location: [facility] ORS;  Service: General;  Laterality: N/A;    Prior to Admission medications   Medication Sig Start Date End Date Taking? Authorizing Provider  Cholecalciferol 1.25 MG (50000 UT) capsule cholecalciferol (vitamin D3) 1,250 mcg (50,000 unit)  capsule    [provider]  clobetasol (OLUX) 0.05 % topical foam Apply topically 2 (two) times daily. 03/28/19   Waldon Merl, PA-C  ibuprofen (ADVIL) 600 MG tablet Take by mouth.    [provider]  Multiple Vitamin (MULTIVITAMIN WITH MINERALS) TABS tablet Take 1 tablet by mouth daily.    [provider]  phentermine (ADIPEX-P) 37.5 MG tablet  05/20/19   [provider]    Allergies Tdap [tetanus-diphth-acell pertussis] and Penicillins  Family History  Problem Relation Age of Onset  . Heart failure Mother   . Hypertension Mother   . Hyperlipidemia Mother   . Kidney disease Mother   . CVA Mother   . Hypothyroidism Mother   . Breast cancer Maternal Aunt 76    Social History Social History   Tobacco Use  . Smoking status: Never Smoker  . Smokeless tobacco: Never Used  Vaping Use  . Vaping Use: Never used  Substance Use Topics  . Alcohol use: No  . Drug use: No      Review of Systems Constitutional: No fever/chills Eyes: No visual changes. ENT: No sore throat. Cardiovascular: Denies chest pain. Respiratory: Denies shortness of breath. Gastrointestinal: No abdominal pain.  No nausea, no vomiting.  No diarrhea.  No constipation. Genitourinary: Negative for dysuria. Musculoskeletal: Negative for back pain. Skin: Negative for rash. Neurological: Negative for headaches, focal weakness or numbness. All other ROS negative ____________________________________________   PHYSICAL EXAM:  VITAL SIGNS: Blood pressure (!) 141/72, pulse (!) 107, temperature 99 F (37.2 C), temperature source Oral, resp. rate 18, height 5\' 6"  (1.676 m), weight 93.9 kg, SpO2 97 %.   Constitutional: Alert and oriented. Well appearing and in no acute distress. Eyes: Conjunctivae are normal. EOMI. Head: Atraumatic.  Swelling noted to the left side of the face. Nose: No congestion/rhinnorhea. Mouth/Throat: Mucous membranes are moist.  Decreased opening of her  mouth with some swelling noted in the posterior molar region Neck: No stridor. Trachea Midline. FROM Cardiovascular: Normal rate, regular rhythm. Grossly normal heart sounds.  Good peripheral circulation. Respiratory: Normal respiratory effort.  No retractions. Lungs CTAB. Gastrointestinal: Soft and nontender. No distention. No abdominal bruits.  Musculoskeletal: No lower extremity tenderness nor edema.  No joint effusions. Neurologic:  Normal speech and language. No gross focal neurologic deficits are appreciated.  Skin:  Skin is warm, dry and intact. No rash noted. Psychiatric: Mood and affect are normal. Speech and behavior are normal. GU: Deferred   ____________________________________________   LABS (all labs ordered are listed, but only abnormal results are displayed)  Labs Reviewed  CBC WITH DIFFERENTIAL/PLATELET - Abnormal; Notable for the following components:      Result Value   Platelets 119 (*)    All other components within normal limits  COMPREHENSIVE METABOLIC PANEL - Abnormal; Notable for the following components:   Glucose, Bld 103 (*)    All other components within normal limits  SARS CORONAVIRUS 2 BY RT PCR (  HOSPITAL ORDER, [place] [facility])  CULTURE, BLOOD (ROUTINE X 2)  CULTURE, BLOOD (ROUTINE X 2)   ____________________________________________   RADIOLOGY  Official radiology report(s): CT Maxillofacial W Contrast  Result Date: 05/26/2020 CLINICAL DATA:  58 year old female status post left side dental extraction 2 days ago with facial swelling. Pain. EXAM: CT MAXILLOFACIAL WITH CONTRAST TECHNIQUE: Multidetector CT imaging of the maxillofacial structures was performed with intravenous contrast. Multiplanar CT image reconstructions were also generated. CONTRAST:  32mL OMNIPAQUE IOHEXOL 300 MG/ML  SOLN COMPARISON:  None. FINDINGS: Osseous: Left mandible posterior molar or wisdom tooth extraction site (series 7, image 59). No superimposed  mandible fracture, the adjacent bone appears intact. However, there is abnormal soft tissue gas in the lateral subperiosteal soft tissues here, continuing into the anterior left masticator space (series 2, image 50 1) suspicious for a small 2-3 cm air and fluid containing abscess just lateral to the extraction site (series 4, images 38 and 39). More anterior and inferior to this area there is low-density but more phlegmon than abscess-like soft tissue change on series 2, image 66. No other acute dental finding. Other facial bones, central skull base, visible calvarium and cervical vertebrae appear intact. Orbits: Intact orbital walls. Symmetric and normal orbits soft tissues. Sinuses: Clear. Soft tissues: In addition to the abnormal soft tissues adjacent to the left mandible and in the left lower masticator space there is widespread left face subcutaneous stranding and swelling. And this wraps around the submental region, with left greater than right submandibular space involvement. Fluid also tracks inferiorly along the strap muscles and anterior to the thyroid space toward the lower neck. No other areas of soft tissue gas. No other drainable fluid identified. Mild reactive appearing left level 1 and level 2 lymph nodes. No cystic or necrotic nodes. Limited intracranial: The major vascular structures in the visible neck and at the skull base are enhancing and patent. Negative visible brain parenchyma. Mild Calcified atherosclerosis at the skull base. IMPRESSION: 1. Odontogenic soft tissue infection with a suspected 2-3 cm air and fluid containing abscess just lateral to the posterior left mandible tooth extraction site and involving the anterior left masticator space. See series 2, image 52. 2. Extension of inflammation caudally into both submandibular spaces and anterior to the thyroid. But no other soft tissue gas or drainable fluid collection. 3. Reactive left level 1 and level 2 lymph nodes. Electronically  Signed   By: Genevie Ann M.D.   On: 05/26/2020 12:29    ____________________________________________   PROCEDURES  Procedure(s) performed (including Critical Care):  Procedures   ____________________________________________   INITIAL IMPRESSION / ASSESSMENT AND PLAN / ED COURSE  Mandy Roberts was evaluated in Emergency Department on 05/26/2020 for the symptoms described in the history of present illness. She was evaluated in the context of the global COVID-19 pandemic, which necessitated consideration that the patient might be at risk for infection with the SARS-CoV-2 virus that causes COVID-19. Institutional protocols and algorithms that pertain to the evaluation of patients at risk for COVID-19 are in a state of rapid change based on information released by regulatory bodies including the CDC and federal and state organizations. These policies and algorithms were followed during the patient's care in the ED.    Patient is a 59 year old who comes in with facial swelling status post removal of a tooth a few days ago.  Patient has significant swelling.  Will get CT scan to evaluate for abscess versus cellulitis.  Uvula looks  midline I do not think it is a peritonsillar abscess.  Lower suspicion for retropharyngeal abscess.  Patient is slightly tachycardic but I suspect that is from pain.  We will give some IV Dilaudid.  At this time she does not meet any sirs criteria or sepsis criteria.   CT scan concerning for small abscess with some cellulitis.  Discussed with Dr. Earnestine Mealing from ENT will attempt bedside I&D and recommended Augmentin.  Patient has a penicillin allergy so we will give clindamycin and dose of Decadron.  Will discuss possible team for admission       ____________________________________________   FINAL CLINICAL IMPRESSION(S) / ED DIAGNOSES   Final diagnoses:  Dental abscess  Cellulitis of face      MEDICATIONS GIVEN DURING THIS VISIT:  Medications  dexamethasone  (DECADRON) injection 10 mg (has no administration in time range)  lidocaine-EPINEPHrine (XYLOCAINE W/EPI) 2 %-1:100000 (with pres) injection 20 mL (has no administration in time range)  clindamycin (CLEOCIN) IVPB 600 mg (has no administration in time range)  ondansetron (ZOFRAN) injection 4 mg (has no administration in time range)  oxyCODONE-acetaminophen (PERCOCET/ROXICET) 5-325 MG per tablet 1 tablet (has no administration in time range)  morphine 2 MG/ML injection 2 mg (has no administration in time range)  acetaminophen (TYLENOL) tablet 650 mg (has no administration in time range)  hydrALAZINE (APRESOLINE) injection 5 mg (has no administration in time range)  HYDROmorphone (DILAUDID) injection 0.5 mg (0.5 mg Intravenous Given 05/26/20 1049)  ondansetron (ZOFRAN) injection 4 mg (4 mg Intravenous Given 05/26/20 1048)  HYDROmorphone (DILAUDID) injection 1 mg (1 mg Intravenous Given 05/26/20 1148)  iohexol (OMNIPAQUE) 300 MG/ML solution 75 mL (75 mLs Intravenous Contrast Given 05/26/20 1153)     ED Discharge Orders    None       Note:  This document was prepared using Dragon voice recognition software and may include unintentional dictation errors.   Vanessa Oviedo, MD 05/26/20 1344

## 2020-05-26 NOTE — H&P (Signed)
History and Physical    Mandy Roberts IPJ:[id number] DOB: 1961-11-17 DOA: 05/26/2020  Referring MD/NP/PA:   PCP: Perrin Maltese, MD   Patient coming from:  The patient is coming from home.  At baseline, pt is independent for most of ADL.        Chief Complaint: left face pain  HPI: Mandy Roberts is a 59 y.o. female with medical history significant of hypertension, diet-controlled diabetes, breast cancer (s/p of radiation and chemotherapy, left mastectomy), thrombocytopenia, who presents with left face pain.  Patient states that she had left mandibular wisdom tooth extraction on Wednesday of last week. She developed left facial pain and swelling, which has been progressively worsening since yesterday.  The pain is constant, sharp, severe, nonradiating. She was not able to sleep last night. She has trismus and decreased appetite.  She has some mild subjective fever and chills.  She has nausea vomited once.  She also had 2 loose stool bowel movement.  Currently no diarrhea or abdominal pain.  No symptoms of UTI or unilateral weakness.  ED Course: pt was found to have WBC 8.6, negative COVID-19 PCR, electrolytes renal function okay, temperature 99, blood pressure 141/72, heart rate 107, RR 18, oxygen saturation 97% on room air. Pt is placed on MedSurg bed for observation.  ENT is consulted for abscess drainage.  CT-maxillofacial: 1. Odontogenic soft tissue infection with a suspected 2-3 cm air and fluid containing abscess just lateral to the posterior left mandible tooth extraction site and involving the anterior left masticator space. See series 2, image 52.  2. Extension of inflammation caudally into both submandibular spaces and anterior to the thyroid. But no other soft tissue gas or drainable fluid collection.  3. Reactive left level 1 and level 2 lymph nodes.   Review of Systems:   General: has fevers, chills, no body weight gain, has poor appetite, has fatigue HEENT: no  blurry vision, hearing changes or sore throat. Has left facial pain and swelling. Respiratory: no dyspnea, coughing, wheezing CV: no chest pain, no palpitations GI: has nausea, vomiting, no abdominal pain, diarrhea, constipation GU: no dysuria, burning on urination, increased urinary frequency, hematuria  Ext: no leg edema Neuro: no unilateral weakness, numbness, or tingling, no vision change or hearing loss Skin: no rash, no skin tear. MSK: No muscle spasm, no deformity, no limitation of range of movement in spin Heme: No easy bruising.  Travel history: No recent long distant travel.  Allergy:  Allergies  Allergen Reactions  . Tdap [Tetanus-Diphth-Acell Pertussis] Shortness Of Breath  . Penicillins Rash    Has patient had a PCN reaction causing immediate rash, facial/tongue/throat swelling, SOB or lightheadedness with hypotension: Yes Has patient had a PCN reaction causing severe rash involving mucus membranes or skin necrosis: No Has patient had a PCN reaction that required hospitalization: No Has patient had a PCN reaction occurring within the last 10 years: No If all of the above answers are "NO", then may proceed with Cephalosporin use.     Past Medical History:  Diagnosis Date  . Atrial fibrillation ([place])   . Breast cancer ([place]) 1990/1997    left mastectomy ,had recurrence in left suprclavic nodes several yrs ago-treated with radiation and chemo  . Colon polyp 2008  . Complication of anesthesia   . Diabetes mellitus without complication ([facility])    history of, loss of 105 lbs  . Difficult intubation    expectorated blood 3-4 days after surgery in 2011  . Difficult intubation  hypoxia with endoscopy in 2016  . History of chemotherapy    5-FU/AC in 1990 under the care of Dr. Oliva Bustard. pt also tx with tamoxifen and arimidex  . History of radiation therapy    radiation treat at [facility]  . Hypertension 2008  . Personal history of chemotherapy 1990   BREAST CA  . Personal  history of malignant neoplasm of breast 1990    left mastectomy   . Personal history of radiation therapy 1997   BREAST CA  . Sinusitis   . Sleep apnea    history of, went away after gastric surgery    Past Surgical History:  Procedure Laterality Date  . ABDOMINAL HYSTERECTOMY  1996  . COLONOSCOPY  2008, 2015   Dr. Jamal Collin  . COLONOSCOPY WITH PROPOFOL N/A 10/12/2019   Procedure: COLONOSCOPY WITH PROPOFOL;  Surgeon: Robert Bellow, MD;  Location: [facility] ENDOSCOPY;  Service: Endoscopy;  Laterality: N/A;  DR BYRNETT WILL UPDATE H&P ON PROCEDURE DATE  . DILATION AND CURETTAGE OF UTERUS    . KNEE ARTHROPLASTY Left 09/07/2017   Procedure: COMPUTER ASSISTED TOTAL KNEE ARTHROPLASTY;  Surgeon: Dereck Leep, MD;  Location: [facility] ORS;  Service: Orthopedics;  Laterality: Left;  . KNEE ARTHROSCOPY Bilateral   . KNEE SURGERY Left 2009, 2010, 2012  . LAPAROSCOPIC GASTRIC RESTRICTIVE DUODENAL PROCEDURE (DUODENAL SWITCH) N/A 04/17/2015   Procedure: LAPAROSCOPIC GASTRIC RESTRICTIVE DUODENAL PROCEDURE, single anastamosis ;  Surgeon: Bonner Puna, MD;  Location: [facility] ORS;  Service: General;  Laterality: N/A;  . MASTECTOMY Left 1990   BREAST CA  . TONSILLECTOMY    . UPPER GI ENDOSCOPY N/A 04/17/2015   Procedure: UPPER GI ENDOSCOPY;  Surgeon: Bonner Puna, MD;  Location: [facility] ORS;  Service: General;  Laterality: N/A;    Social History:  reports that she has never smoked. She has never used smokeless tobacco. She reports that she does not drink alcohol and does not use drugs.  Family History:  Family History  Problem Relation Age of Onset  . Heart failure Mother   . Hypertension Mother   . Hyperlipidemia Mother   . Kidney disease Mother   . CVA Mother   . Hypothyroidism Mother   . Breast cancer Maternal Aunt 76     Prior to Admission medications   Medication Sig Start Date End Date Taking? Authorizing Provider  Cholecalciferol 1.25 MG (50000 UT) capsule cholecalciferol (vitamin D3) 1,250 mcg  (50,000 unit) capsule    [provider]  clobetasol (OLUX) 0.05 % topical foam Apply topically 2 (two) times daily. 03/28/19   Waldon Merl, PA-C  ibuprofen (ADVIL) 600 MG tablet Take by mouth.    [provider]  Multiple Vitamin (MULTIVITAMIN WITH MINERALS) TABS tablet Take 1 tablet by mouth daily.    [provider]  phentermine (ADIPEX-P) 37.5 MG tablet  05/20/19   [provider]    Physical Exam: Vitals:   05/26/20 1017 05/26/20 1500 05/26/20 1546 05/26/20 1550  BP:  138/68 131/73 (!) 127/57  Pulse:  74 73 (!) 59  Resp:  15 16 16   Temp:    98.2 F (36.8 C)  TempSrc:    Oral  SpO2:  93% 95% 97%  Weight: 93.9 kg     Height: 5' 6"  (1.676 m)      General: Not in acute distress HEENT: has swelling, tenderness, warmth in left face.  Has decreased opening of her mouth       Eyes: PERRL, EOMI, no scleral icterus.  ENT: No discharge from the ears and nose.        Neck: No JVD, no bruit, no mass felt. Heme: No neck lymph node enlargement. Cardiac: S1/S2, RRR, No murmurs, No gallops or rubs. Respiratory:  No rales, wheezing, rhonchi or rubs. GI: Soft, nondistended, nontender, no rebound pain, no organomegaly, BS present. GU: No hematuria Ext: No pitting leg edema bilaterally. 2+DP/PT pulse bilaterally. Musculoskeletal: No joint deformities, No joint redness or warmth, no limitation of ROM in spin. Skin: No rashes.  Neuro: Alert, oriented X3, cranial nerves II-XII grossly intact, moves all extremities normally. Psych: Patient is not psychotic, no suicidal or hemocidal ideation.  Labs on Admission: I have personally reviewed following labs and imaging studies  CBC: Recent Labs  Lab 05/26/20 1054  WBC 8.6  NEUTROABS 6.9  HGB 13.6  HCT 41.5  MCV 90.8  PLT 595*   Basic Metabolic Panel: Recent Labs  Lab 05/26/20 1054  NA 139  K 4.2  CL 105  CO2 27  GLUCOSE 103*  BUN 11  CREATININE 0.67  CALCIUM 9.1   GFR: Estimated  Creatinine Clearance: 87.4 mL/min (by C-G formula based on SCr of 0.67 mg/dL). Liver Function Tests: Recent Labs  Lab 05/26/20 1054  AST 17  ALT 21  ALKPHOS 114  BILITOT 0.9  PROT 7.4  ALBUMIN 3.9   No results for input(s): LIPASE, AMYLASE in the last 168 hours. No results for input(s): AMMONIA in the last 168 hours. Coagulation Profile: No results for input(s): INR, PROTIME in the last 168 hours. Cardiac Enzymes: No results for input(s): CKTOTAL, CKMB, CKMBINDEX, TROPONINI in the last 168 hours. BNP (last 3 results) No results for input(s): PROBNP in the last 8760 hours. HbA1C: No results for input(s): HGBA1C in the last 72 hours. CBG: No results for input(s): GLUCAP in the last 168 hours. Lipid Profile: No results for input(s): CHOL, HDL, LDLCALC, TRIG, CHOLHDL, LDLDIRECT in the last 72 hours. Thyroid Function Tests: No results for input(s): TSH, T4TOTAL, FREET4, T3FREE, THYROIDAB in the last 72 hours. Anemia Panel: No results for input(s): VITAMINB12, FOLATE, FERRITIN, TIBC, IRON, RETICCTPCT in the last 72 hours. Urine analysis:    Component Value Date/Time   COLORURINE YELLOW (A) 08/26/2017 0827   APPEARANCEUR HAZY (A) 08/26/2017 0827   LABSPEC 1.021 08/26/2017 0827   PHURINE 5.0 08/26/2017 0827   GLUCOSEU NEGATIVE 08/26/2017 0827   HGBUR NEGATIVE 08/26/2017 0827   BILIRUBINUR neg 05/19/2018 1019   KETONESUR NEGATIVE 08/26/2017 0827   PROTEINUR Negative 05/19/2018 1019   PROTEINUR NEGATIVE 08/26/2017 0827   UROBILINOGEN 0.2 05/19/2018 1019   NITRITE neg 05/19/2018 1019   NITRITE NEGATIVE 08/26/2017 0827   LEUKOCYTESUR Negative 05/19/2018 1019   Sepsis Labs: @LABRCNTIP (procalcitonin:4,lacticidven:4) ) Recent Results (from the past 240 hour(s))  SARS Coronavirus 2 by RT PCR (hospital order, performed in [place] hospital lab) Nasopharyngeal Nasopharyngeal Swab     Status: None   Collection Time: 05/26/20 11:01 AM   Specimen: Nasopharyngeal Swab  Result  Value Ref Range Status   SARS Coronavirus 2 NEGATIVE NEGATIVE Final    Comment: (NOTE) SARS-CoV-2 target nucleic acids are NOT DETECTED.  The SARS-CoV-2 RNA is generally detectable in upper and lower respiratory specimens during the acute phase of infection. The lowest concentration of SARS-CoV-2 viral copies this assay can detect is 250 copies / mL. A negative result does not preclude SARS-CoV-2 infection and should not be used as the sole basis for treatment or other patient management decisions.  A negative  result may occur with improper specimen collection / handling, submission of specimen other than nasopharyngeal swab, presence of viral mutation(s) within the areas targeted by this assay, and inadequate number of viral copies (<250 copies / mL). A negative result must be combined with clinical observations, patient history, and epidemiological information.  Fact Sheet for Patients:   StrictlyIdeas.no  Fact Sheet for Healthcare Providers: BankingDealers.co.za  This test is not yet approved or  cleared by the [place] FDA and has been authorized for detection and/or diagnosis of SARS-CoV-2 by FDA under an Emergency Use Authorization (EUA).  This EUA will remain in effect (meaning this test can be used) for the duration of the COVID-19 declaration under Section 564(b)(1) of the Act, 21 U.S.C. section 360bbb-3(b)(1), unless the authorization is terminated or revoked sooner.  Performed at [facility], [address], [place], [place] [address]      Radiological Exams on Admission: CT Maxillofacial W Contrast  Result Date: 05/26/2020 CLINICAL DATA:  59 year old female status post left side dental extraction 2 days ago with facial swelling. Pain. EXAM: CT MAXILLOFACIAL WITH CONTRAST TECHNIQUE: Multidetector CT imaging of the maxillofacial structures was performed with intravenous contrast. Multiplanar CT image  reconstructions were also generated. CONTRAST:  15m OMNIPAQUE IOHEXOL 300 MG/ML  SOLN COMPARISON:  None. FINDINGS: Osseous: Left mandible posterior molar or wisdom tooth extraction site (series 7, image 59). No superimposed mandible fracture, the adjacent bone appears intact. However, there is abnormal soft tissue gas in the lateral subperiosteal soft tissues here, continuing into the anterior left masticator space (series 2, image 50 1) suspicious for a small 2-3 cm air and fluid containing abscess just lateral to the extraction site (series 4, images 38 and 39). More anterior and inferior to this area there is low-density but more phlegmon than abscess-like soft tissue change on series 2, image 66. No other acute dental finding. Other facial bones, central skull base, visible calvarium and cervical vertebrae appear intact. Orbits: Intact orbital walls. Symmetric and normal orbits soft tissues. Sinuses: Clear. Soft tissues: In addition to the abnormal soft tissues adjacent to the left mandible and in the left lower masticator space there is widespread left face subcutaneous stranding and swelling. And this wraps around the submental region, with left greater than right submandibular space involvement. Fluid also tracks inferiorly along the strap muscles and anterior to the thyroid space toward the lower neck. No other areas of soft tissue gas. No other drainable fluid identified. Mild reactive appearing left level 1 and level 2 lymph nodes. No cystic or necrotic nodes. Limited intracranial: The major vascular structures in the visible neck and at the skull base are enhancing and patent. Negative visible brain parenchyma. Mild Calcified atherosclerosis at the skull base. IMPRESSION: 1. Odontogenic soft tissue infection with a suspected 2-3 cm air and fluid containing abscess just lateral to the posterior left mandible tooth extraction site and involving the anterior left masticator space. See series 2, image 52. 2.  Extension of inflammation caudally into both submandibular spaces and anterior to the thyroid. But no other soft tissue gas or drainable fluid collection. 3. Reactive left level 1 and level 2 lymph nodes. Electronically Signed   By: HGenevie AnnM.D.   On: 05/26/2020 12:29     EKG:   Not done in ED, will get one.   Assessment/Plan Principal Problem:   Abscess of mandible_left Active Problems:   Hypertension   Thrombocytopenia (HCC)   Facial cellulitis_left   Abscess of mandible and  facial cellulitis-left: Patient does not have leukocytosis.  Temperature 99.  Does not meet criteria for sepsis.  ENT is consulted, Dr. Earnestine Mealing performed I&D.  - Placed on MedSurg bed for observation - Empiric antimicrobial treatment with Unasyn (patient received 1 dose of clindamycin in ED).  Patient states that she probably does not have true allergic reaction to penicillin, she can take Unasyn. - PRN Zofran for nausea, morphine and Percocet for pain - Blood cultures x 2  - ESR and CRP  Hypertension: Patient not taking medications at home.  Blood pressure 141/72 -IV hydralazine as needed -Continue  Thrombocytopenia ([place]): This is chronic issue.  Etiology is not clear.  Platelet 119, no bleeding tendency. -Follow-up with CBC    DVT ppx: SCd Code Status: Full code Family Communication: Yes, patient's sister-in law at bed side Disposition Plan:  Anticipate discharge back to previous environment Consults called:  Dr. Earnestine Mealing of ENT Admission status: Med-surg bed for obs     Status is: Observation  The patient remains OBS appropriate and will d/c before 2 midnights.  Dispo: The patient is from: Home              Anticipated d/c is to: Home              Anticipated d/c date is: 1 day              Patient currently is not medically stable to d/c.          Date of Service 05/26/2020    Ivor Costa [facility]   If 7PM-7AM, please contact night-coverage [email or web address] 05/26/2020, 6:06  PM

## 2020-05-26 NOTE — ED Notes (Signed)
Pt transported to CT ?

## 2020-05-26 NOTE — ED Triage Notes (Signed)
PT to ED via POV. Pt is having swelling on the left side of her face. Pt had tooth removed on Thursday morning. Swelling started after having tooth pulled but got worse yesterday. Pt is also having significant pain that is not relieved by pain medication. Pt is able to swallow but it is painful.

## 2020-05-26 NOTE — Progress Notes (Signed)
Rec'd pt from ED w NAD noted, she transferred self from ED stretcher to bed in room without assistance.  Gait is steady although pt is sleepy.  A&Ox4. Resting with eyes closed in bed currently denies any pain or discomfort.  Oriented to bed and call bell system.  Will monitor.

## 2020-05-26 NOTE — Progress Notes (Signed)
Pt states she woke up from nap and IV to R AC was completely out of her arm.  Cath and tape removed from pts arm without issue.  IV consult placed.

## 2020-05-26 NOTE — Consult Note (Signed)
[name], [name] [id number] 1961/10/27 Ivor Costa, MD  Reason for Consult: Left odontogenic abscess  HPI:   Mandy Roberts is a 59 y.o. female who underwent left mandibular wisdom tooth extraction on Wednesday of last week with Dr. Thalia Bloodgood. She had swelling after the procedure and noted worsening swelling yesterday. She has had increased pain and has been taking tylenol and ibuprofen. She was not able to sleep last night. She has trismus and decreased appetite. She had chills last night-- unsure of fevers. No dysphagia, dysphonia, dyspnea. She is not on anticoagulation. She reports that she is not allergic to penicillins-- she has taken them previously and tolerated them.   CT face with contrast revealed left air and fluid collection associated with extraction site. WBC 8.6.  Allergies:  Allergies  Allergen Reactions  . Tdap [Tetanus-Diphth-Acell Pertussis] Shortness Of Breath  . Penicillins Rash    Has patient had a PCN reaction causing immediate rash, facial/tongue/throat swelling, SOB or lightheadedness with hypotension: Yes Has patient had a PCN reaction causing severe rash involving mucus membranes or skin necrosis: No Has patient had a PCN reaction that required hospitalization: No Has patient had a PCN reaction occurring within the last 10 years: No If all of the above answers are "NO", then may proceed with Cephalosporin use.     ROS: Review of systems normal other than 12 systems except per HPI.  PMH:  Past Medical History:  Diagnosis Date  . Atrial fibrillation ([place])   . Breast cancer ([place]) 1990/1997    left mastectomy ,had recurrence in left suprclavic nodes several yrs ago-treated with radiation and chemo  . Colon polyp 2008  . Complication of anesthesia   . Diabetes mellitus without complication ([facility])    history of, loss of 105 lbs  . Difficult intubation    expectorated blood 3-4 days after surgery in 2011  . Difficult intubation    hypoxia with endoscopy in 2016  .  History of chemotherapy    5-FU/AC in 1990 under the care of Dr. Oliva Bustard. pt also tx with tamoxifen and arimidex  . History of radiation therapy    [facility]  . Hypertension 2008  . Personal history of chemotherapy 1990   BREAST CA  . Personal history of malignant neoplasm of breast 1990    left mastectomy   . Personal history of radiation therapy 1997   BREAST CA  . Sinusitis   . Sleep apnea    history of, went away after gastric surgery    FH:  Family History  Problem Relation Age of Onset  . Heart failure Mother   . Hypertension Mother   . Hyperlipidemia Mother   . Kidney disease Mother   . CVA Mother   . Hypothyroidism Mother   . Breast cancer Maternal Aunt 76    SH:  Social History   Socioeconomic History  . Marital status: Widowed    Spouse name: Not on file  . Number of children: Not on file  . Years of education: Not on file  . Highest education level: Not on file  Occupational History  . Not on file  Tobacco Use  . Smoking status: Never Smoker  . Smokeless tobacco: Never Used  Vaping Use  . Vaping Use: Never used  Substance and Sexual Activity  . Alcohol use: No  . Drug use: No  . Sexual activity: Not on file  Other Topics Concern  . Not on file  Social History Narrative  . Not  on file   Social Determinants of Health   Financial Resource Strain:   . Difficulty of Paying Living Expenses:   Food Insecurity:   . Worried About Charity fundraiser in the Last Year:   . Arboriculturist in the Last Year:   Transportation Needs:   . Film/video editor (Medical):   Marland Kitchen Lack of Transportation (Non-Medical):   Physical Activity:   . Days of Exercise per Week:   . Minutes of Exercise per Session:   Stress:   . Feeling of Stress :   Social Connections:   . Frequency of Communication with Friends and Family:   . Frequency of Social Gatherings with Friends and Family:   . Attends Religious Services:   . Active Member of Clubs or  Organizations:   . Attends Archivist Meetings:   Marland Kitchen Marital Status:   Intimate Partner Violence:   . Fear of Current or Ex-Partner:   . Emotionally Abused:   Marland Kitchen Physically Abused:   . Sexually Abused:     PSH:  Past Surgical History:  Procedure Laterality Date  . ABDOMINAL HYSTERECTOMY  1996  . COLONOSCOPY  2008, 2015   Dr. Jamal Collin  . COLONOSCOPY WITH PROPOFOL N/A 10/12/2019   Procedure: COLONOSCOPY WITH PROPOFOL;  Surgeon: Robert Bellow, MD;  Location: [facility] ENDOSCOPY;  Service: Endoscopy;  Laterality: N/A;  DR BYRNETT WILL UPDATE H&P ON PROCEDURE DATE  . DILATION AND CURETTAGE OF UTERUS    . KNEE ARTHROPLASTY Left 09/07/2017   Procedure: COMPUTER ASSISTED TOTAL KNEE ARTHROPLASTY;  Surgeon: Dereck Leep, MD;  Location: [facility] ORS;  Service: Orthopedics;  Laterality: Left;  . KNEE ARTHROSCOPY Bilateral   . KNEE SURGERY Left 2009, 2010, 2012  . LAPAROSCOPIC GASTRIC RESTRICTIVE DUODENAL PROCEDURE (DUODENAL SWITCH) N/A 04/17/2015   Procedure: LAPAROSCOPIC GASTRIC RESTRICTIVE DUODENAL PROCEDURE, single anastamosis ;  Surgeon: Bonner Puna, MD;  Location: [facility] ORS;  Service: General;  Laterality: N/A;  . MASTECTOMY Left 1990   BREAST CA  . TONSILLECTOMY    . UPPER GI ENDOSCOPY N/A 04/17/2015   Procedure: UPPER GI ENDOSCOPY;  Surgeon: Bonner Puna, MD;  Location: [facility] ORS;  Service: General;  Laterality: N/A;    Physical  Exam: BP 138/68 (BP Location: Right Arm)   Pulse 74   Temp 99 F (37.2 C) (Oral)   Resp 15   Ht 5\' 6"  (1.676 m)   Wt 93.9 kg   LMP  (LMP Unknown)   SpO2 93%   BMI 33.41 kg/m   Awake and alert in no acute distress.  No stridor. Speaking in complete sentences.  Left facial edema and induration. No palpable fluctuance externally. Exquisitely tender to palpation. No erythema.   Facial sensation intact bilaterally aside from mild left V3 distribution decreased sensation.  Facial mobility is intact bilaterally, mildly limited by edema.  Trismus to 2 cm  on initial exam. Left posterior mandible and RMT ecchymotic. Floor of mouth is soft. Oropharynx is clear and patent, no edema.  Skin warm and dry.External nose and ears without masses or lesions. EOMI, PERRLA. Neck edema and induration in left submandibular region.  No masses or lesions. No lymphadenopathy palpated. Thyroid normal with no masses.  CT maxillofacial with contrast: Personally reviewed.   1. Odontogenic soft tissue infection with a suspected 2-3 cm air and fluid containing abscess just lateral to the posterior left mandible tooth extraction site and involving the anterior left masticator space. See series 2, image 52.  2. Extension of inflammation caudally into both submandibular spaces and anterior to the thyroid. But no other soft tissue gas or drainable fluid collection.  3. Reactive left level 1 and level 2 lymph nodes.  Procedure: Incision and drainage of left mandibular abscess Indication: fluid collection following dental extraction Anesthesia: 3cc of 1% lidocaine with 1:100,000   After written informed consent was obtained, the patient was placed in a semirecumbent position with the oral cavity opened. There was trismus to 2cm. The left posterior mandible and RMT was injected with 3cc of local anesthetic. The ecchymotic area of the left posterior mandible was incised with an 11 blade. A Kelly clamp was used to bluntly dissect into the area lateral to the mandibular body and ramus. Scant murky fluid was evacuated along with air when massaging the neck externally. This cavity was irrigated with normal saline. There was no bleeding. Trismus improved immediately. There were no immediate complications.   A/P: 65F with developing left odontogenic abscess following left mandibular wisdom tooth extraction s/p incision and drainage.  - Recommend admission for IV antibiotics; patient states she has taken penicillins previously without reaction though penicillins are listed on her  allergy list. If tolerates, recommend at least Unasyn. Clindamycin ok if true allergy.  - Recommend compress with massage, posterior to anterior - Soft diet - Mouth rinses with saline and mouthwash TID after meals - If improving, can discharge on Augmentin (or Clindamycin) with follow-up with oral surgeon   Marjo Bicker Deloros Beretta 05/26/2020 3:22 PM

## 2020-05-27 DIAGNOSIS — T8143XA Infection following a procedure, organ and space surgical site, initial encounter: Secondary | ICD-10-CM | POA: Diagnosis present

## 2020-05-27 DIAGNOSIS — K047 Periapical abscess without sinus: Secondary | ICD-10-CM

## 2020-05-27 DIAGNOSIS — D696 Thrombocytopenia, unspecified: Secondary | ICD-10-CM | POA: Diagnosis not present

## 2020-05-27 DIAGNOSIS — D649 Anemia, unspecified: Secondary | ICD-10-CM | POA: Diagnosis present

## 2020-05-27 DIAGNOSIS — E119 Type 2 diabetes mellitus without complications: Secondary | ICD-10-CM | POA: Diagnosis present

## 2020-05-27 DIAGNOSIS — Z6833 Body mass index (BMI) 33.0-33.9, adult: Secondary | ICD-10-CM | POA: Diagnosis not present

## 2020-05-27 DIAGNOSIS — M272 Inflammatory conditions of jaws: Secondary | ICD-10-CM | POA: Diagnosis not present

## 2020-05-27 DIAGNOSIS — Z83438 Family history of other disorder of lipoprotein metabolism and other lipidemia: Secondary | ICD-10-CM | POA: Diagnosis not present

## 2020-05-27 DIAGNOSIS — Z8249 Family history of ischemic heart disease and other diseases of the circulatory system: Secondary | ICD-10-CM | POA: Diagnosis not present

## 2020-05-27 DIAGNOSIS — Z9221 Personal history of antineoplastic chemotherapy: Secondary | ICD-10-CM | POA: Diagnosis not present

## 2020-05-27 DIAGNOSIS — Z803 Family history of malignant neoplasm of breast: Secondary | ICD-10-CM | POA: Diagnosis not present

## 2020-05-27 DIAGNOSIS — I10 Essential (primary) hypertension: Secondary | ICD-10-CM | POA: Diagnosis present

## 2020-05-27 DIAGNOSIS — Z841 Family history of disorders of kidney and ureter: Secondary | ICD-10-CM | POA: Diagnosis not present

## 2020-05-27 DIAGNOSIS — L03211 Cellulitis of face: Secondary | ICD-10-CM | POA: Diagnosis not present

## 2020-05-27 DIAGNOSIS — Z853 Personal history of malignant neoplasm of breast: Secondary | ICD-10-CM | POA: Diagnosis not present

## 2020-05-27 DIAGNOSIS — Z880 Allergy status to penicillin: Secondary | ICD-10-CM | POA: Diagnosis not present

## 2020-05-27 DIAGNOSIS — Z20822 Contact with and (suspected) exposure to covid-19: Secondary | ICD-10-CM | POA: Diagnosis present

## 2020-05-27 DIAGNOSIS — Z9012 Acquired absence of left breast and nipple: Secondary | ICD-10-CM | POA: Diagnosis not present

## 2020-05-27 DIAGNOSIS — Z96652 Presence of left artificial knee joint: Secondary | ICD-10-CM | POA: Diagnosis present

## 2020-05-27 DIAGNOSIS — Z923 Personal history of irradiation: Secondary | ICD-10-CM | POA: Diagnosis not present

## 2020-05-27 DIAGNOSIS — Z823 Family history of stroke: Secondary | ICD-10-CM | POA: Diagnosis not present

## 2020-05-27 DIAGNOSIS — Y848 Other medical procedures as the cause of abnormal reaction of the patient, or of later complication, without mention of misadventure at the time of the procedure: Secondary | ICD-10-CM | POA: Diagnosis present

## 2020-05-27 DIAGNOSIS — Z90710 Acquired absence of both cervix and uterus: Secondary | ICD-10-CM | POA: Diagnosis not present

## 2020-05-27 DIAGNOSIS — E669 Obesity, unspecified: Secondary | ICD-10-CM | POA: Diagnosis present

## 2020-05-27 DIAGNOSIS — Z887 Allergy status to serum and vaccine status: Secondary | ICD-10-CM | POA: Diagnosis not present

## 2020-05-27 LAB — CBC
HCT: 38.7 % (ref 36.0–46.0)
Hemoglobin: 12.7 g/dL (ref 12.0–15.0)
MCH: 29.1 pg (ref 26.0–34.0)
MCHC: 32.8 g/dL (ref 30.0–36.0)
MCV: 88.8 fL (ref 80.0–100.0)
Platelets: 130 10*3/uL — ABNORMAL LOW (ref 150–400)
RBC: 4.36 MIL/uL (ref 3.87–5.11)
RDW: 12.8 % (ref 11.5–15.5)
WBC: 12.4 10*3/uL — ABNORMAL HIGH (ref 4.0–10.5)
nRBC: 0 % (ref 0.0–0.2)

## 2020-05-27 LAB — BASIC METABOLIC PANEL
Anion gap: 6 (ref 5–15)
BUN: 13 mg/dL (ref 6–20)
CO2: 27 mmol/L (ref 22–32)
Calcium: 9.2 mg/dL (ref 8.9–10.3)
Chloride: 105 mmol/L (ref 98–111)
Creatinine, Ser: 0.78 mg/dL (ref 0.44–1.00)
GFR calc Af Amer: >60 mL/min (ref >60)
GFR calc non Af Amer: >60 mL/min (ref >60)
Glucose, Bld: 146 mg/dL — ABNORMAL HIGH (ref 70–99)
Potassium: 4.5 mmol/L (ref 3.5–5.1)
Sodium: 138 mmol/L (ref 135–145)

## 2020-05-27 NOTE — Plan of Care (Signed)
  Problem: Education: Goal: Knowledge of General Education information will improve Description Including pain rating scale, medication(s)/side effects and non-pharmacologic comfort measures Outcome: Progressing   

## 2020-05-27 NOTE — Progress Notes (Signed)
PROGRESS NOTE   Mandy Roberts  PFX:[id number]    DOB: 09-27-1961    DOA: 05/26/2020  PCP: Perrin Maltese, MD   I have briefly reviewed patients previous medical records in [facility].  Chief Complaint  Patient presents with  . 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 Delfino Lovett Daily, presented with left facial pain, swelling, subjective fever and chills.  Admitted for left odontogenic abscess with facial cellulitis, ENT consulted, s/p I&D 7/3, continuing IV antibiotics.  Slowly improving.   Assessment & Plan:  Principal Problem:   Abscess of mandible_left Active Problems:   Hypertension   Thrombocytopenia ([place])   Facial cellulitis_left   Dental abscess   Left odontogenic abscess and facial cellulitis: Complicating recent left mandibular wisdom tooth extraction on 05/24/2020.  ENT consulted and s/p I&D on 7/3.  Tolerating IV Unasyn, continue.  Slowly improving.  Continue warm compress with massage, posterior to anterior, diet as tolerated, mouth rinses with saline and mouthwash 3 times daily after meals and pain control.  Continue close monitoring to make sure she does not worsen.  Outpatient follow-up with her surgeon at discharge.  Blood cultures negative to date.  Essential hypertension: Not on meds at home.  Controlled.  Thrombocytopenia: Chronic and stable.  History of diet-controlled DM: Reportedly resolved after duodenal switch surgery in 2016 and states that her last A1c was 5.6.  History of A. fib?  Paroxysmal: Listed on her past medical history but not on rate control medications or anticoagulation.  Clinically in sinus rhythm.  Outpatient  follow-up.  Body mass index is 33.41 kg/m./Obesity  DVT prophylaxis: SCD Code Status: Full Family Communication: Discussed with daughter at bedside, updated care and answered questions Disposition:  Status is: Observation  The patient will require care spanning > 2 midnights and should be moved to inpatient because: IV treatments appropriate due to intensity of illness or inability to take PO  Dispo: The patient is from: Home              Anticipated d/c is to: Home              Anticipated d/c date is: 1 day              Patient currently is not medically stable to d/c.        Consultants:   ENT-Dr. Brayton Mars  Procedures:   I&D of left odontogenic abscess on 7/3.  Antimicrobials:   IV Unasyn 7/3 >   Subjective:  Patient interviewed and examined along with her adult daughter at bedside.  Patient reports that she works as a Probation officer at [employer] and her next daughter also works as an Therapist, sports.  Overall feels better.  Left-sided facial pain is down by about 50% and swelling down by about 30%.  Pain does radiate to left side of neck.  No swallowing or breathing difficulties.  Tolerating diet well.  Asking for regular diet.  Objective:   Vitals:   05/26/20 2219 05/27/20 0256 05/27/20 0654 05/27/20 0753  BP: (!) 129/58 131/63 (!) 113/59 (!) 116/58  Pulse: 68 62 67 67  Resp: 15 18 17 16   Temp: 98.8 F (37.1 C) 98.2 F (36.8 C) 98.7 F (  37.1 C) 98 F (36.7 C)  TempSrc: Oral Oral Oral Oral  SpO2: 97% 96% 95% 97%  Weight:      Height:        General exam: Pleasant young female, moderately built and obese sitting up comfortably in bed eating breakfast. ENT: Diffuse left facial/maxillary/submandibular swelling with increased warmth, mild tenderness.  Does have significant induration at the left jawline without fluctuance.  Trismus, able to open her mouth by approximately 3 cm. Respiratory system: Clear to auscultation. Respiratory effort normal. Cardiovascular system: S1 &  S2 heard, RRR. No JVD, rubs, gallops or clicks. No pedal edema.  2/6 short systolic murmur best heard at apex. Gastrointestinal system: Abdomen is nondistended, soft and nontender. No organomegaly or masses felt. Normal bowel sounds heard. Central nervous system: Alert and oriented. No focal neurological deficits. Extremities: Symmetric 5 x 5 power. Skin: No rashes, lesions or ulcers Psychiatry: Judgement and insight appear normal. Mood & affect appropriate.     Data Reviewed:   I have personally reviewed following labs and imaging studies   CBC: Recent Labs  Lab 05/26/20 1054 05/27/20 0434  WBC 8.6 12.4*  NEUTROABS 6.9  --   HGB 13.6 12.7  HCT 41.5 38.7  MCV 90.8 88.8  PLT 119* 130*    Basic Metabolic Panel: Recent Labs  Lab 05/26/20 1054 05/27/20 0434  NA 139 138  K 4.2 4.5  CL 105 105  CO2 27 27  GLUCOSE 103* 146*  BUN 11 13  CREATININE 0.67 0.78  CALCIUM 9.1 9.2    Liver Function Tests: Recent Labs  Lab 05/26/20 1054  AST 17  ALT 21  ALKPHOS 114  BILITOT 0.9  PROT 7.4  ALBUMIN 3.9    CBG: No results for input(s): GLUCAP in the last 168 hours.  Microbiology Studies:   Recent Results (from the past 240 hour(s))  SARS Coronavirus 2 by RT PCR (hospital order, performed in [facility] hospital lab) Nasopharyngeal Nasopharyngeal Swab     Status: None   Collection Time: 05/26/20 11:01 AM   Specimen: Nasopharyngeal Swab  Result Value Ref Range Status   SARS Coronavirus 2 NEGATIVE NEGATIVE Final    Comment: (NOTE) SARS-CoV-2 target nucleic acids are NOT DETECTED.  The SARS-CoV-2 RNA is generally detectable in upper and lower respiratory specimens during the acute phase of infection. The lowest concentration of SARS-CoV-2 viral copies this assay can detect is 250 copies / mL. A negative result does not preclude SARS-CoV-2 infection and should not be used as the sole basis for treatment or other patient management decisions.  A negative result may  occur with improper specimen collection / handling, submission of specimen other than nasopharyngeal swab, presence of viral mutation(s) within the areas targeted by this assay, and inadequate number of viral copies (<250 copies / mL). A negative result must be combined with clinical observations, patient history, and epidemiological information.  Fact Sheet for Patients:   StrictlyIdeas.no  Fact Sheet for Healthcare Providers: BankingDealers.co.za  This test is not yet approved or  cleared by the [place] FDA and has been authorized for detection and/or diagnosis of SARS-CoV-2 by FDA under an Emergency Use Authorization (EUA).  This EUA will remain in effect (meaning this test can be used) for the duration of the COVID-19 declaration under Section 564(b)(1) of the Act, 21 U.S.C. section 360bbb-3(b)(1), unless the authorization is terminated or revoked sooner.  Performed at [facility], [address], [place],  [address]   CULTURE, BLOOD (ROUTINE  X 2) w Reflex to ID Panel     Status: None (Preliminary result)   Collection Time: 05/26/20  4:41 PM   Specimen: BLOOD  Result Value Ref Range Status   Specimen Description BLOOD LEFT ANTECUBITAL  Final   Special Requests   Final    BOTTLES DRAWN AEROBIC AND ANAEROBIC Blood Culture adequate volume   Culture   Final    NO GROWTH < 24 HOURS Performed at [facility], [address] [place], [place] [address]    Report Status PENDING  Incomplete  CULTURE, BLOOD (ROUTINE X 2) w Reflex to ID Panel     Status: None (Preliminary result)   Collection Time: 05/26/20  6:03 PM   Specimen: BLOOD  Result Value Ref Range Status   Specimen Description BLOOD LEFT ANTECUBITAL  Final   Special Requests   Final    BOTTLES DRAWN AEROBIC AND ANAEROBIC Blood Culture results may not be optimal due to an excessive volume of blood received in culture bottles   Culture   Final     NO GROWTH < 12 HOURS Performed at [facility], [address] [place], [place] [address]    Report Status PENDING  Incomplete     Radiology Studies:  CT Maxillofacial W Contrast  Result Date: 05/26/2020 CLINICAL DATA:  59 year old female status post left side dental extraction 2 days ago with facial swelling. Pain. EXAM: CT MAXILLOFACIAL WITH CONTRAST TECHNIQUE: Multidetector CT imaging of the maxillofacial structures was performed with intravenous contrast. Multiplanar CT image reconstructions were also generated. CONTRAST:  70mL OMNIPAQUE IOHEXOL 300 MG/ML  SOLN COMPARISON:  None. FINDINGS: Osseous: Left mandible posterior molar or wisdom tooth extraction site (series 7, image 59). No superimposed mandible fracture, the adjacent bone appears intact. However, there is abnormal soft tissue gas in the lateral subperiosteal soft tissues here, continuing into the anterior left masticator space (series 2, image 50 1) suspicious for a small 2-3 cm air and fluid containing abscess just lateral to the extraction site (series 4, images 38 and 39). More anterior and inferior to this area there is low-density but more phlegmon than abscess-like soft tissue change on series 2, image 66. No other acute dental finding. Other facial bones, central skull base, visible calvarium and cervical vertebrae appear intact. Orbits: Intact orbital walls. Symmetric and normal orbits soft tissues. Sinuses: Clear. Soft tissues: In addition to the abnormal soft tissues adjacent to the left mandible and in the left lower masticator space there is widespread left face subcutaneous stranding and swelling. And this wraps around the submental region, with left greater than right submandibular space involvement. Fluid also tracks inferiorly along the strap muscles and anterior to the thyroid space toward the lower neck. No other areas of soft tissue gas. No other drainable fluid identified. Mild reactive appearing left level  1 and level 2 lymph nodes. No cystic or necrotic nodes. Limited intracranial: The major vascular structures in the visible neck and at the skull base are enhancing and patent. Negative visible brain parenchyma. Mild Calcified atherosclerosis at the skull base. IMPRESSION: 1. Odontogenic soft tissue infection with a suspected 2-3 cm air and fluid containing abscess just lateral to the posterior left mandible tooth extraction site and involving the anterior left masticator space. See series 2, image 52. 2. Extension of inflammation caudally into both submandibular spaces and anterior to the thyroid. But no other soft tissue gas or drainable fluid collection. 3. Reactive left level 1 and level 2 lymph nodes. Electronically  Signed   By: Genevie Ann M.D.   On: 05/26/2020 12:29     Scheduled Meds:   . multivitamin with minerals  1 tablet Oral Daily    Continuous Infusions:   . sodium chloride 500 mL (05/26/20 1847)  . ampicillin-sulbactam (UNASYN) IV 3 g (05/27/20 1128)     LOS: 0 days     Vernell Leep, MD, [place], [facility]. [facility]    To contact the attending provider between 7A-7P or the covering provider during after hours 7P-7A, please log into the web site [email or web address] and access using universal [place] password for that web site. If you do not have the password, please call the hospital operator.  05/27/2020, 11:38 AM

## 2020-05-27 NOTE — Plan of Care (Signed)
Pt. states she is feeling better. Pain is well-controlled Problem: Education: Goal: Knowledge of General Education information will improve Description: Including pain rating scale, medication(s)/side effects and non-pharmacologic comfort measures Outcome: Progressing   Problem: Health Behavior/Discharge Planning: Goal: Ability to manage health-related needs will improve Outcome: Progressing   Problem: Clinical Measurements: Goal: Ability to maintain clinical measurements within normal limits will improve Outcome: Progressing Goal: Will remain free from infection Outcome: Progressing Goal: Diagnostic test results will improve Outcome: Progressing Goal: Respiratory complications will improve Outcome: Progressing Goal: Cardiovascular complication will be avoided Outcome: Progressing   Problem: Activity: Goal: Risk for activity intolerance will decrease Outcome: Progressing   Problem: Nutrition: Goal: Adequate nutrition will be maintained Outcome: Progressing   Problem: Coping: Goal: Level of anxiety will decrease Outcome: Progressing   Problem: Elimination: Goal: Will not experience complications related to bowel motility Outcome: Progressing Goal: Will not experience complications related to urinary retention Outcome: Progressing   Problem: Pain Managment: Goal: General experience of comfort will improve Outcome: Progressing   Problem: Safety: Goal: Ability to remain free from injury will improve Outcome: Progressing   Problem: Skin Integrity: Goal: Risk for impaired skin integrity will decrease Outcome: Progressing

## 2020-05-27 NOTE — Progress Notes (Signed)
Patient has been ambulating around nurses station independently, VSS.

## 2020-05-28 LAB — CBC
HCT: 35 % — ABNORMAL LOW (ref 36.0–46.0)
Hemoglobin: 11.4 g/dL — ABNORMAL LOW (ref 12.0–15.0)
MCH: 29.5 pg (ref 26.0–34.0)
MCHC: 32.6 g/dL (ref 30.0–36.0)
MCV: 90.4 fL (ref 80.0–100.0)
Platelets: 124 10*3/uL — ABNORMAL LOW (ref 150–400)
RBC: 3.87 MIL/uL (ref 3.87–5.11)
RDW: 13 % (ref 11.5–15.5)
WBC: 9.8 10*3/uL (ref 4.0–10.5)
nRBC: 0 % (ref 0.0–0.2)

## 2020-05-28 MED ORDER — DEXAMETHASONE SODIUM PHOSPHATE 10 MG/ML IJ SOLN
10.0000 mg | Freq: Once | INTRAMUSCULAR | Status: AC
  Administered 2020-05-28: 10 mg via INTRAVENOUS
  Filled 2020-05-28: qty 1

## 2020-05-28 NOTE — Progress Notes (Signed)
Ch visited with Pt as part of routine rounding. Pt said she was waiting to be discharged tomorrow. Pt reported to Ch that she has good family support, and talks to them over the phone when they don't come in person to visit. Pt is an Personal assistant. Pt requested prayer. Ch prayed with Pt. Pt was grateful for visit.

## 2020-05-28 NOTE — Progress Notes (Signed)
PROGRESS NOTE   Mandy Roberts  PJK:[id number]    DOB: March 23, 1961    DOA: 05/26/2020  PCP: Perrin Maltese, MD   I have briefly reviewed patients previous medical records in [facility].  Chief Complaint  Patient presents with  . 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 Delfino Lovett Daily, presented with left facial pain, swelling, subjective fever and chills.  Admitted for left odontogenic abscess with facial cellulitis, ENT consulted, s/p I&D 7/3, continuing IV antibiotics.  Somewhat worse today, requested ENT to reassess.   Assessment & Plan:  Principal Problem:   Abscess of mandible_left Active Problems:   Hypertension   Thrombocytopenia ([place])   Facial cellulitis_left   Dental abscess   Left odontogenic abscess and facial cellulitis: Complicating recent left mandibular wisdom tooth extraction on 05/24/2020.  ENT consulted and s/p I&D on 7/3.  Tolerating IV Unasyn, continue.  Slowly improving.  Continue warm compress with massage, posterior to anterior, diet as tolerated, mouth rinses with saline and mouthwash 3 times daily after meals and pain control.  Continue close monitoring to make sure she does not worsen.  Outpatient follow-up with her surgeon at discharge.  Blood cultures negative to date.  Reports feeling somewhat worse today, left jaw area feels harder, left earache, more difficulty opening her mouth.  Requested ENT to see again, input appreciated recommend continuing IV antibiotics at least for an additional day, give a dose of Decadron 10 mg to help with inflammation.  They indicate that if worse can consider repeating contrasted CT to look for drainable  collection but low suspicion at this time.  At discharge recommend Augmentin for 14 days.  Essential hypertension: Not on meds at home.  Controlled.  Thrombocytopenia: Chronic and stable.  Also mildly anemic.  Follow CBC.  History of diet-controlled DM: Reportedly resolved after duodenal switch surgery in 2016 and states that her last A1c was 5.6.  History of A. fib?  Paroxysmal: Listed on her past medical history but not on rate control medications or anticoagulation.  Clinically in sinus rhythm.  Outpatient follow-up.  Body mass index is 33.41 kg/m./Obesity  DVT prophylaxis: SCD Code Status: Full Family Communication: Discussed with daughter at bedside on 7/4, updated care and answered questions.  None at bedside today. Disposition:  Status is: Observation  The patient will require care spanning > 2 midnights and should be moved to inpatient because: IV treatments appropriate due to intensity of illness or inability to take PO  Dispo: The patient is from: Home              Anticipated d/c is to: Home              Anticipated d/c date is: 1 day              Patient currently is not medically stable to d/c.        Consultants:   ENT-Dr. Brayton Mars  Procedures:   I&D of left odontogenic abscess on 7/3.  Antimicrobials:   IV Unasyn 7/3 >   Subjective:  Feels worse today.  Left jaw area feels harder.  Pain in the left ear.  Unable to open mouth as wide as yesterday.  No difficulty  swallowing or breathing.  Objective:   Vitals:   05/27/20 1525 05/27/20 2305 05/28/20 0817 05/28/20 1600  BP: 124/67 133/78 127/61 125/74  Pulse: 67 70 (!) 57 66  Resp: 16 15 16 15   Temp: 97.7 F (36.5 C) 99.3 F (37.4 C) 97.9 F (36.6 C) 98.6 F (37 C)  TempSrc: Oral Oral Oral Oral  SpO2: 100% 100% 96% 98%  Weight:      Height:        General exam: Pleasant young female, moderately built and obese sitting up comfortably in bed eating breakfast. ENT: Diffuse left  facial/maxillary/submandibular swelling with increased warmth, mild tenderness.  Does have significant induration at the left jawline, may be slightly worse today than yesterday, without fluctuance.  Trismus, able to open her mouth by approximately 3 cm. Respiratory system: Clear to auscultation. Respiratory effort normal. Cardiovascular system: S1 & S2 heard, RRR. No JVD, rubs, gallops or clicks. No pedal edema.  2/6 short systolic murmur best heard at apex. Gastrointestinal system: Abdomen is nondistended, soft and nontender. No organomegaly or masses felt. Normal bowel sounds heard. Central nervous system: Alert and oriented. No focal neurological deficits. Extremities: Symmetric 5 x 5 power. Skin: No rashes, lesions or ulcers Psychiatry: Judgement and insight appear normal. Mood & affect appropriate.     Data Reviewed:   I have personally reviewed following labs and imaging studies   CBC: Recent Labs  Lab 05/26/20 1054 05/27/20 0434 05/28/20 0720  WBC 8.6 12.4* 9.8  NEUTROABS 6.9  --   --   HGB 13.6 12.7 11.4*  HCT 41.5 38.7 35.0*  MCV 90.8 88.8 90.4  PLT 119* 130* 124*    Basic Metabolic Panel: Recent Labs  Lab 05/26/20 1054 05/27/20 0434  NA 139 138  K 4.2 4.5  CL 105 105  CO2 27 27  GLUCOSE 103* 146*  BUN 11 13  CREATININE 0.67 0.78  CALCIUM 9.1 9.2    Liver Function Tests: Recent Labs  Lab 05/26/20 1054  AST 17  ALT 21  ALKPHOS 114  BILITOT 0.9  PROT 7.4  ALBUMIN 3.9    CBG: No results for input(s): GLUCAP in the last 168 hours.  Microbiology Studies:   Recent Results (from the past 240 hour(s))  SARS Coronavirus 2 by RT PCR (hospital order, performed in [facility] hospital lab) Nasopharyngeal Nasopharyngeal Swab     Status: None   Collection Time: 05/26/20 11:01 AM   Specimen: Nasopharyngeal Swab  Result Value Ref Range Status   SARS Coronavirus 2 NEGATIVE NEGATIVE Final    Comment: (NOTE) SARS-CoV-2 target nucleic acids are NOT  DETECTED.  The SARS-CoV-2 RNA is generally detectable in upper and lower respiratory specimens during the acute phase of infection. The lowest concentration of SARS-CoV-2 viral copies this assay can detect is 250 copies / mL. A negative result does not preclude SARS-CoV-2 infection and should not be used as the sole basis for treatment or other patient management decisions.  A negative result may occur with improper specimen collection / handling, submission of specimen other than nasopharyngeal swab, presence of viral mutation(s) within the areas targeted by this assay, and inadequate number of viral copies (<250 copies / mL). A negative result must be combined with clinical observations, patient history, and epidemiological information.  Fact Sheet for Patients:   StrictlyIdeas.no  Fact Sheet for Healthcare Providers: BankingDealers.co.za  This test is not yet approved or  cleared by the [place] FDA and has been authorized for detection and/or diagnosis  of SARS-CoV-2 by FDA under an Emergency Use Authorization (EUA).  This EUA will remain in effect (meaning this test can be used) for the duration of the COVID-19 declaration under Section 564(b)(1) of the Act, 21 U.S.C. section 360bbb-3(b)(1), unless the authorization is terminated or revoked sooner.  Performed at [facility], [address] [place], [place] [address]   CULTURE, BLOOD (ROUTINE X 2) w Reflex to ID Panel     Status: None (Preliminary result)   Collection Time: 05/26/20  4:41 PM   Specimen: BLOOD  Result Value Ref Range Status   Specimen Description BLOOD LEFT ANTECUBITAL  Final   Special Requests   Final    BOTTLES DRAWN AEROBIC AND ANAEROBIC Blood Culture adequate volume   Culture   Final    NO GROWTH 2 DAYS Performed at [facility], [address], [place], [place] [address]    Report Status PENDING  Incomplete  CULTURE, BLOOD  (ROUTINE X 2) w Reflex to ID Panel     Status: None (Preliminary result)   Collection Time: 05/26/20  6:03 PM   Specimen: BLOOD  Result Value Ref Range Status   Specimen Description BLOOD LEFT ANTECUBITAL  Final   Special Requests   Final    BOTTLES DRAWN AEROBIC AND ANAEROBIC Blood Culture results may not be optimal due to an excessive volume of blood received in culture bottles   Culture   Final    NO GROWTH 2 DAYS Performed at [facility], [address], [place], [place] [address]    Report Status PENDING  Incomplete     Radiology Studies:  No results found.   Scheduled Meds:   . multivitamin with minerals  1 tablet Oral Daily    Continuous Infusions:   . sodium chloride 500 mL (05/26/20 1847)  . ampicillin-sulbactam (UNASYN) IV 3 g (05/28/20 1629)     LOS: 1 day     Vernell Leep, MD, [place], [facility]. [facility]    To contact the attending provider between 7A-7P or the covering provider during after hours 7P-7A, please log into the web site [email or web address] and access using universal [place] password for that web site. If you do not have the password, please call the hospital operator.  05/28/2020, 7:31 PM

## 2020-05-28 NOTE — Consult Note (Signed)
[place], Guerrera [id number] 1961/01/18 Modena Jansky, MD  Reason for Consult: Left odontogenic abscess  HPI:   Mandy Roberts is a 59 y.o. female who underwent left mandibular wisdom tooth extraction on Wednesday of last week with Dr. Thalia Bloodgood. She had swelling after the procedure and noted worsening swelling yesterday. She has had increased pain and has been taking tylenol and ibuprofen. She was not able to sleep last night. She has trismus and decreased appetite. She had chills last night-- unsure of fevers. No dysphagia, dysphonia, dyspnea. She is not on anticoagulation. She reports that she is not allergic to penicillins-- she has taken them previously and tolerated them.   CT face with contrast revealed left air and fluid collection associated with extraction site. WBC 8.6.   Interval HPI:   Mandy Roberts reports some tightness to her jaw. Her mouth opening is improved though she continues to have pain with it. She also has left ear pain. No dysphagia or dyspnea. She has been afebrile with stable vital signs. WBC down to 9.8 today; suspect yesterday's bump to 12.4 related to Decadron dose 7/3 after I/D.    Allergies:  Allergies  Allergen Reactions   Tdap [Tetanus-Diphth-Acell Pertussis] Shortness Of Breath   Penicillins Rash    Has patient had a PCN reaction causing immediate rash, facial/tongue/throat swelling, SOB or lightheadedness with hypotension: Yes Has patient had a PCN reaction causing severe rash involving mucus membranes or skin necrosis: No Has patient had a PCN reaction that required hospitalization: No Has patient had a PCN reaction occurring within the last 10 years: No If all of the above answers are "NO", then may proceed with Cephalosporin use.     ROS: Review of systems normal other than 12 systems except per HPI.  PMH:  Past Medical History:  Diagnosis Date   Atrial fibrillation ([place])    Breast cancer ([place]) 1990/1997    left mastectomy ,had recurrence in  left suprclavic nodes several yrs ago-treated with radiation and chemo   Colon polyp [id number]   Complication of anesthesia    Diabetes mellitus without complication ([place])    history of, loss of 105 lbs   Difficult intubation    expectorated blood 3-4 days after surgery in 2011   Difficult intubation    hypoxia with endoscopy in 2016   History of chemotherapy    5-FU/AC in 1990 under the care of Dr. Oliva Bustard. pt also tx with tamoxifen and arimidex   History of radiation therapy    radiation treat at [facility]   Hypertension 2008   Personal history of chemotherapy 1990   BREAST CA   Personal history of malignant neoplasm of breast 1990    left mastectomy    Personal history of radiation therapy 1997   BREAST CA   Sinusitis    Sleep apnea    history of, went away after gastric surgery    FH:  Family History  Problem Relation Age of Onset   Heart failure Mother    Hypertension Mother    Hyperlipidemia Mother    Kidney disease Mother    CVA Mother    Hypothyroidism Mother    Breast cancer Maternal Aunt 76    SH:  Social History   Socioeconomic History   Marital status: Widowed    Spouse name: Not on file   Number of children: Not on file   Years of education: Not on file   Highest education level: Not on file  Occupational History  Not on file  Tobacco Use   Smoking status: Never Smoker   Smokeless tobacco: Never Used  Vaping Use   Vaping Use: Never used  Substance and Sexual Activity   Alcohol use: No   Drug use: No   Sexual activity: Not on file  Other Topics Concern   Not on file  Social History Narrative   Not on file   Social Determinants of Health   Financial Resource Strain:    Difficulty of Paying Living Expenses:   Food Insecurity:    Worried About Charity fundraiser in the Last Year:    Arboriculturist in the Last Year:   Transportation Needs:    Film/video editor (Medical):    Lack of Transportation  (Non-Medical):   Physical Activity:    Days of Exercise per Week:    Minutes of Exercise per Session:   Stress:    Feeling of Stress :   Social Connections:    Frequency of Communication with Friends and Family:    Frequency of Social Gatherings with Friends and Family:    Attends Religious Services:    Active Member of Clubs or Organizations:    Attends Archivist Meetings:    Marital Status:   Intimate Partner Violence:    Fear of Current or Ex-Partner:    Emotionally Abused:    Physically Abused:    Sexually Abused:     PSH:  Past Surgical History:  Procedure Laterality Date   [place]  2008, 2015   Dr. Jamal Collin   COLONOSCOPY WITH PROPOFOL N/A 10/12/2019   Procedure: COLONOSCOPY WITH PROPOFOL;  Surgeon: Robert Bellow, MD;  Location: [facility] ENDOSCOPY;  Service: Endoscopy;  Laterality: N/A;  DR BYRNETT WILL UPDATE H&P ON PROCEDURE DATE   DILATION AND CURETTAGE OF UTERUS     KNEE ARTHROPLASTY Left 09/07/2017   Procedure: COMPUTER ASSISTED TOTAL KNEE ARTHROPLASTY;  Surgeon: Dereck Leep, MD;  Location: [facility] ORS;  Service: Orthopedics;  Laterality: Left;   KNEE ARTHROSCOPY Bilateral    KNEE SURGERY Left 2009, 2010, 2012   LAPAROSCOPIC GASTRIC RESTRICTIVE DUODENAL PROCEDURE (DUODENAL SWITCH) N/A 04/17/2015   Procedure: LAPAROSCOPIC GASTRIC RESTRICTIVE DUODENAL PROCEDURE, single anastamosis ;  Surgeon: Bonner Puna, MD;  Location: [facility] ORS;  Service: General;  Laterality: N/A;   MASTECTOMY Left 1990   BREAST CA   TONSILLECTOMY     UPPER GI ENDOSCOPY N/A 04/17/2015   Procedure: UPPER GI ENDOSCOPY;  Surgeon: Bonner Puna, MD;  Location: [facility] ORS;  Service: General;  Laterality: N/A;    Physical  Exam: BP 127/61 (BP Location: Left Arm)    Pulse (!) 57    Temp 97.9 F (36.6 C) (Oral)    Resp 16    Ht 5\' 6"  (1.676 m)    Wt 93.9 kg    LMP  (LMP Unknown)    SpO2 96%    BMI 33.41 kg/m   Awake and alert in no acute  distress.  No stridor. Speaking in complete sentences.  Left facial edema improved. She has induration and tenderness along the left mandible.  No palpable fluctuance. No skin erythema.   Facial sensation intact bilaterally aside from mild left V3 distribution decreased sensation.  Facial mobility is intact bilaterally, mildly limited by edema.  Trismus improved. Left posterior mandible and RMT I/D site edematous with fibrinous tissue. No fluctuance.. Floor of mouth is soft. Oropharynx is clear and patent, no edema.  Skin warm and dry.External nose and ears without masses or lesions. EOMI,   CT maxillofacial with contrast (05/26/20): Personally reviewed.   1. Odontogenic soft tissue infection with a suspected 2-3 cm air and fluid containing abscess just lateral to the posterior left mandible tooth extraction site and involving the anterior left masticator space. See series 2, image 52.  2. Extension of inflammation caudally into both submandibular spaces and anterior to the thyroid. But no other soft tissue gas or drainable fluid collection.  3. Reactive left level 1 and level 2 lymph nodes.    A/P: 2F with left odontogenic abscess following left mandibular wisdom tooth extraction s/p incision and drainage 7/3. Her edema and trismus are improving. She continues to have induration without fluctuance. Induration will take several days up to 2 weeks to fully resolved. Left ear pain is referred from the jaw. Her exam and clinical picture are reassuring.   - Appreciate hospital medicine care - Recommend continued IV antibiotics; she is tolerating Unasyn well - Decadron 10mg  today to help symptomatically; anticipate this may elevate WBC tomorrow.  - Recommend compress with massage, posterior to anterior - Soft diet - Mouth rinses with saline and mouthwash TID after meals - If improving, can discharge on Augmentin x 14 with follow-up with oral surgeon - If worsening, can consider repeat  contrasted CT imaging to look for drainable collection; low suspicion for this at this time   Gaye Alken 05/28/2020 10:51 AM

## 2020-05-29 LAB — CBC
HCT: 35.7 % — ABNORMAL LOW (ref 36.0–46.0)
Hemoglobin: 12 g/dL (ref 12.0–15.0)
MCH: 29.9 pg (ref 26.0–34.0)
MCHC: 33.6 g/dL (ref 30.0–36.0)
MCV: 89 fL (ref 80.0–100.0)
Platelets: 140 10*3/uL — ABNORMAL LOW (ref 150–400)
RBC: 4.01 MIL/uL (ref 3.87–5.11)
RDW: 13.1 % (ref 11.5–15.5)
WBC: 7.5 10*3/uL (ref 4.0–10.5)
nRBC: 0 % (ref 0.0–0.2)

## 2020-05-29 MED ORDER — OXYCODONE-ACETAMINOPHEN 5-325 MG PO TABS: 1.0000 | ORAL_TABLET | ORAL | 0 refills | Status: DC | PRN

## 2020-05-29 MED ORDER — FLUCONAZOLE 150 MG PO TABS
150.0000 mg | ORAL_TABLET | ORAL | 0 refills | Status: AC
Start: 2020-05-29 — End: 2020-05-31

## 2020-05-29 MED ORDER — AMOXICILLIN-POT CLAVULANATE 875-125 MG PO TABS
1.0000 | ORAL_TABLET | Freq: Two times a day (BID) | ORAL | 0 refills | Status: AC
Start: 2020-05-29 — End: 2020-06-12

## 2020-05-29 NOTE — Discharge Instructions (Signed)
Dental Abscess  A dental abscess is an area of pus in or around a tooth. It comes from an infection. It can cause pain and other symptoms. Treatment will help with symptoms and prevent the infection from spreading. Follow these instructions at home: Medicines  Take over-the-counter and prescription medicines only as told by your dentist.  If you were prescribed an antibiotic medicine, take it as told by your dentist. Do not stop taking it even if you start to feel better.  If you were prescribed a gel that has numbing medicine in it, use it exactly as told.  Do not drive or use heavy machinery (like a Conservation officer, nature) while taking prescription pain medicine. General instructions  Rinse out your mouth often with salt water. ? To make salt water, dissolve -1 tsp of salt in 1 cup of warm water.  Eat a soft diet while your mouth is healing.  Drink enough fluid to keep your urine pale yellow.  Do not apply heat to the outside of your mouth.  Do not use any products that contain nicotine or tobacco. These include cigarettes and e-cigarettes. If you need help quitting, ask your doctor.  Keep all follow-up visits as told by your dentist. This is important. Prevent an abscess  Brush your teeth every morning and every night. Use fluoride toothpaste.  Floss your teeth each day.  Get dental cleanings as often as told by your dentist.  Think about getting dental sealant put on teeth that have deep holes (decay).  Drink water that has fluoride in it. ? Most tap water has fluoride. ? Check the label on bottled water to see if it has fluoride in it.  Drink water instead of sugary drinks.  Eat healthy meals and snacks.  Wear a mouth guard or face shield when you play sports. Contact a doctor if:  Your pain is worse, and medicine does not help. Get help right away if:  You have a fever or chills.  Your symptoms suddenly get worse.  You have a very bad headache.  You have problems  breathing or swallowing.  You have trouble opening your mouth.  You have swelling in your neck or close to your eye. Summary  A dental abscess is an area of pus in or around a tooth. It is caused by an infection.  Treatment will help with symptoms and prevent the infection from spreading.  Take over-the-counter and prescription medicines only as told by your dentist.  To prevent an abscess, take good care of your teeth. Brush your teeth every morning and night. Use floss every day.  Get dental cleanings as often as told by your dentist. This information is not intended to replace advice given to you by your health care provider. Make sure you discuss any questions you have with your health care provider. Document Revised: 03/02/2019 Document Reviewed: 07/13/2017 Elsevier Patient Education  [place].   Cellulitis, Adult  Cellulitis is a skin infection. The infected area is often warm, red, swollen, and sore. It occurs most often in the arms and lower legs. It is very important to get treated for this condition. What are the causes? This condition is caused by bacteria. The bacteria enter through a break in the skin, such as a cut, burn, insect bite, open sore, or crack. What increases the risk? This condition is more likely to occur in people who:  Have a weak body defense system (immune system).  Have open cuts, burns, bites, or  scrapes on the skin.  Are older than 59 years of age.  Have a blood sugar problem (diabetes).  Have a long-lasting (chronic) liver disease (cirrhosis) or kidney disease.  Are very overweight (obese).  Have a skin problem, such as: ? Itchy rash (eczema). ? Slow movement of blood in the veins (venous stasis). ? Fluid buildup below the skin (edema).  Have been treated with high-energy rays (radiation).  Use IV drugs. What are the signs or symptoms? Symptoms of this condition include:  Skin that  is: ? Red. ? Streaking. ? Spotting. ? Swollen. ? Sore or painful when you touch it. ? Warm.  A fever.  Chills.  Blisters. How is this diagnosed? This condition is diagnosed based on:  Medical history.  Physical exam.  Blood tests.  Imaging tests. How is this treated? Treatment for this condition may include:  Medicines to treat infections or allergies.  Home care, such as: ? Rest. ? Placing cold or warm cloths (compresses) on the skin.  Hospital care, if the condition is very bad. Follow these instructions at home: Medicines  Take over-the-counter and prescription medicines only as told by your doctor.  If you were prescribed an antibiotic medicine, take it as told by your doctor. Do not stop taking it even if you start to feel better. General instructions   Drink enough fluid to keep your pee (urine) pale yellow.  Do not touch or rub the infected area.  Raise (elevate) the infected area above the level of your heart while you are sitting or lying down.  Place cold or warm cloths on the area as told by your doctor.  Keep all follow-up visits as told by your doctor. This is important. Contact a doctor if:  You have a fever.  You do not start to get better after 1-2 days of treatment.  Your bone or joint under the infected area starts to hurt after the skin has healed.  Your infection comes back. This can happen in the same area or another area.  You have a swollen bump in the area.  You have new symptoms.  You feel ill and have muscle aches and pains. Get help right away if:  Your symptoms get worse.  You feel very sleepy.  You throw up (vomit) or have watery poop (diarrhea) for a long time.  You see red streaks coming from the area.  Your red area gets larger.  Your red area turns dark in color. These symptoms may represent a serious problem that is an emergency. Do not wait to see if the symptoms will go away. Get medical help right  away. Call your local emergency services (911 in the [place] Do not drive yourself to the hospital. Summary  Cellulitis is a skin infection. The area is often warm, red, swollen, and sore.  This condition is treated with medicines, rest, and cold and warm cloths.  Take all medicines only as told by your doctor.  Tell your doctor if symptoms do not start to get better after 1-2 days of treatment. This information is not intended to replace advice given to you by your health care provider. Make sure you discuss any questions you have with your health care provider. Document Revised: 04/01/2018 Document Reviewed: 04/01/2018 Elsevier Patient Education  [place].

## 2020-05-29 NOTE — Discharge Summary (Signed)
[facility]  Physician Discharge Summary   Patient ID: Mandy Roberts MRN: [id number] DOB/AGE: 08/14/1961 59 y.o.  Admit date: 05/26/2020 Discharge date: 05/29/2020  PCP: Perrin Maltese, MD  DISCHARGE DIAGNOSES:  Left odontogenic abscess and facial cellulitis, improved Essential hypertension Thrombocytopenia Diet-controlled diabetes type 2   RECOMMENDATIONS FOR OUTPATIENT FOLLOW UP: 1. 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 Delfino Lovett Daily, presented with left facial pain, swelling, subjective fever and chills.  Admitted for left odontogenic abscess with facial cellulitis, ENT consulted, s/p I&D 7/3, continuing IV antibiotics.  Somewhat worse today, requested ENT to reassess.  Consultations:  ENT-Dr. Brayton Mars  Procedures: I&D of left odontogenic abscess on 7/3.   HOSPITAL COURSE:   Left odontogenic abscess and facial cellulitis: Complicating recent left mandibular wisdom tooth extraction on 05/24/2020.    She was placed on Unasyn.  ENT consulted and s/p I&D on 7/3.  Cultures have been negative.  She was also given Decadron.  Patient feels much better this morning.  Recommendation is to discharge patient on Augmentin for 14 days.  She will follow-up with her oral surgeon sometime this week.  Essential hypertension: Not on meds at home.  Controlled.  Thrombocytopenia: Chronic and stable.  Also mildly anemic.    History of diet-controlled DM: Reportedly resolved after duodenal switch surgery in 2016 and  states that her last A1c was 5.6.  History of A. fib?  Paroxysmal: Listed on her past medical history but not on rate control medications or anticoagulation.  Clinically in sinus rhythm.  Outpatient follow-up.  Obesity Estimated body mass index is 33.41 kg/m as calculated from the following:   Height as of this encounter: 5\' 6"  (1.676 m).   Weight as of this encounter: 93.9 kg.   Overall stable.  Okay for discharge home today.   PERTINENT LABS:  The results of significant diagnostics from this hospitalization (including imaging, microbiology, ancillary and laboratory) are listed below for reference.    Microbiology: Recent Results (from the past 240 hour(s))  SARS Coronavirus 2 by RT PCR (hospital order, performed in [facility] hospital lab) Nasopharyngeal Nasopharyngeal Swab     Status: None   Collection Time: 05/26/20 11:01 AM   Specimen: Nasopharyngeal Swab  Result Value Ref Range Status   SARS Coronavirus 2 NEGATIVE NEGATIVE Final    Comment: (NOTE) SARS-CoV-2 target nucleic acids are NOT DETECTED.  The SARS-CoV-2 RNA is generally detectable in upper and lower respiratory specimens during the acute phase of infection. The lowest concentration of SARS-CoV-2 viral copies this assay can detect is 250 copies / mL. A negative result does not preclude SARS-CoV-2 infection and should not be used as the sole basis for treatment or other patient management decisions.  A negative result may occur with improper specimen collection / handling, submission of specimen other than nasopharyngeal swab, presence of viral mutation(s) within the areas targeted by this assay, and inadequate number of viral copies (<250 copies / mL). A negative result must be combined with clinical observations, patient history, and epidemiological information.  Fact Sheet for Patients:   StrictlyIdeas.no  Fact Sheet  for Healthcare  Providers: BankingDealers.co.za  This test is not yet approved or  cleared by the [place] and has been authorized for detection and/or diagnosis of SARS-CoV-2 by FDA under an Emergency Use Authorization (EUA).  This EUA will remain in effect (meaning this test can be used) for the duration of the COVID-19 declaration under Section 564(b)(1) of the Act, 21 U.S.C. section 360bbb-3(b)(1), unless the authorization is terminated or revoked sooner.  Performed at [facility], [address] [place], [place] [address]   CULTURE, BLOOD (ROUTINE X 2) w Reflex to ID Panel     Status: None (Preliminary result)   Collection Time: 05/26/20  4:41 PM   Specimen: BLOOD  Result Value Ref Range Status   Specimen Description BLOOD LEFT ANTECUBITAL  Final   Special Requests   Final    BOTTLES DRAWN AEROBIC AND ANAEROBIC Blood Culture adequate volume   Culture   Final    NO GROWTH 3 DAYS Performed at [facility], [address] [place], [place] [address]    Report Status PENDING  Incomplete  CULTURE, BLOOD (ROUTINE X 2) w Reflex to ID Panel     Status: None (Preliminary result)   Collection Time: 05/26/20  6:03 PM   Specimen: BLOOD  Result Value Ref Range Status   Specimen Description BLOOD LEFT ANTECUBITAL  Final   Special Requests   Final    BOTTLES DRAWN AEROBIC AND ANAEROBIC Blood Culture results may not be optimal due to an excessive volume of blood received in culture bottles   Culture   Final    NO GROWTH 3 DAYS Performed at [facility], [address] [place], [place] [address]    Report Status PENDING  Incomplete     Labs:  COVID-19 Labs  Recent Labs    05/26/20 1641  CRP 7.3*    Lab Results  Component Value Date   SARSCOV2NAA NEGATIVE 05/26/2020   [place] NEGATIVE 10/07/2019      Basic Metabolic Panel: Recent Labs  Lab 05/26/20 1054 05/27/20 0434  NA 139 138  K 4.2 4.5  CL 105 105  CO2 27 27   GLUCOSE 103* 146*  BUN 11 13  CREATININE 0.67 0.78  CALCIUM 9.1 9.2   Liver Function Tests: Recent Labs  Lab 05/26/20 1054  AST 17  ALT 21  ALKPHOS 114  BILITOT 0.9  PROT 7.4  ALBUMIN 3.9   CBC: Recent Labs  Lab 05/26/20 1054 05/27/20 0434 05/28/20 0720 05/29/20 0444  WBC 8.6 12.4* 9.8 7.5  NEUTROABS 6.9  --   --   --   HGB 13.6 12.7 11.4* 12.0  HCT 41.5 38.7 35.0* 35.7*  MCV 90.8 88.8 90.4 89.0  PLT 119* 130* 124* 140*     IMAGING STUDIES CT Maxillofacial W Contrast  Result Date: 05/26/2020 CLINICAL DATA:  59 year old female status post left side dental extraction 2 days ago with facial swelling. Pain. EXAM: CT MAXILLOFACIAL WITH CONTRAST TECHNIQUE: Multidetector CT imaging of the maxillofacial structures was performed with intravenous contrast. Multiplanar CT image reconstructions were also generated. CONTRAST:  3mL OMNIPAQUE IOHEXOL 300 MG/ML  SOLN COMPARISON:  None. FINDINGS: Osseous: Left mandible posterior molar or wisdom tooth extraction site (series 7, image 59). No superimposed mandible fracture, the adjacent bone appears intact. However, there is abnormal soft tissue gas in the lateral subperiosteal soft tissues here, continuing into the anterior left masticator space (series 2, image 50 1) suspicious for a small 2-3 cm air and  fluid containing abscess just lateral to the extraction site (series 4, images 38 and 39). More anterior and inferior to this area there is low-density but more phlegmon than abscess-like soft tissue change on series 2, image 66. No other acute dental finding. Other facial bones, central skull base, visible calvarium and cervical vertebrae appear intact. Orbits: Intact orbital walls. Symmetric and normal orbits soft tissues. Sinuses: Clear. Soft tissues: In addition to the abnormal soft tissues adjacent to the left mandible and in the left lower masticator space there is widespread left face subcutaneous stranding and swelling. And this wraps  around the submental region, with left greater than right submandibular space involvement. Fluid also tracks inferiorly along the strap muscles and anterior to the thyroid space toward the lower neck. No other areas of soft tissue gas. No other drainable fluid identified. Mild reactive appearing left level 1 and level 2 lymph nodes. No cystic or necrotic nodes. Limited intracranial: The major vascular structures in the visible neck and at the skull base are enhancing and patent. Negative visible brain parenchyma. Mild Calcified atherosclerosis at the skull base. IMPRESSION: 1. Odontogenic soft tissue infection with a suspected 2-3 cm air and fluid containing abscess just lateral to the posterior left mandible tooth extraction site and involving the anterior left masticator space. See series 2, image 52. 2. Extension of inflammation caudally into both submandibular spaces and anterior to the thyroid. But no other soft tissue gas or drainable fluid collection. 3. Reactive left level 1 and level 2 lymph nodes. Electronically Signed   By: Genevie Ann M.D.   On: 05/26/2020 12:29    DISCHARGE EXAMINATION: Vitals:   05/28/20 0817 05/28/20 1600 05/28/20 2336 05/29/20 0742  BP: 127/61 125/74 135/67 (!) 148/68  Pulse: (!) 57 66 (!) 55 (!) 56  Resp: 16 15 16 17   Temp: 97.9 F (36.6 C) 98.6 F (37 C) 97.9 F (36.6 C) 98.2 F (36.8 C)  TempSrc: Oral Oral Oral Oral  SpO2: 96% 98% 98% 97%  Weight:      Height:         DISPOSITION: Home  Discharge Instructions    Call MD for:  extreme fatigue   Complete by: As directed    Call MD for:  persistant dizziness or light-headedness   Complete by: As directed    Call MD for:  persistant nausea and vomiting   Complete by: As directed    Call MD for:  severe uncontrolled pain   Complete by: As directed    Call MD for:  temperature >100.4   Complete by: As directed    Diet general   Complete by: As directed    Discharge instructions   Complete by: As  directed    Please be sure to follow-up with your oral surgeon.  Take your medications as prescribed.  Seek attention immediately if you develop high fever or worsening swelling of your face.  You were cared for by a hospitalist during your hospital stay. If you have any questions about your discharge medications or the care you received while you were in the hospital after you are discharged, you can call the unit and asked to speak with the hospitalist on call if the hospitalist that took care of you is not available. Once you are discharged, your primary care physician will handle any further medical issues. Please note that NO REFILLS for any discharge medications will be authorized once you are discharged, as it is imperative that you return to  your primary care physician (or establish a relationship with a primary care physician if you do not have one) for your aftercare needs so that they can reassess your need for medications and monitor your lab values. If you do not have a primary care physician, you can call [phone] for a physician referral.   Increase activity slowly   Complete by: As directed        Allergies as of 05/29/2020      Reactions   Tdap [tetanus-diphth-acell Pertussis] Shortness Of Breath   Penicillins Rash   Has patient had a PCN reaction causing immediate rash, facial/tongue/throat swelling, SOB or lightheadedness with hypotension: Yes Has patient had a PCN reaction causing severe rash involving mucus membranes or skin necrosis: No Has patient had a PCN reaction that required hospitalization: No Has patient had a PCN reaction occurring within the last 10 years: No If all of the above answers are "NO", then may proceed with Cephalosporin use.      Medication List    STOP taking these medications   amoxicillin 875 MG tablet Commonly known as: AMOXIL   traMADol 50 MG tablet Commonly known as: ULTRAM     TAKE these medications   amoxicillin-clavulanate 875-125 MG  tablet Commonly known as: Augmentin Take 1 tablet by mouth 2 (two) times daily for 14 days.   Cholecalciferol 1.25 MG (50000 UT) capsule cholecalciferol (vitamin D3) 1,250 mcg (50,000 unit) capsule   fluconazole 150 MG tablet Commonly known as: Diflucan Take 1 tablet (150 mg total) by mouth every 3 (three) days for 2 days.   multivitamin with minerals Tabs tablet Take 1 tablet by mouth daily.   oxyCODONE-acetaminophen 5-325 MG tablet Commonly known as: PERCOCET/ROXICET Take 1 tablet by mouth every 4 (four) hours as needed for moderate pain.   phentermine 37.5 MG tablet Commonly known as: ADIPEX-P         Follow-up Information    Carol Ada, MD On 05/31/2020.   Specialty: Dentistry Why: @ 8:45 am Contact information: [place] [address] [phone]               TOTAL DISCHARGE TIME: 35 mins  [place]  [facility] Pager on [email or web address]  05/29/2020, 3:29 PM

## 2020-05-31 ENCOUNTER — Encounter: Payer: Self-pay | Admitting: *Deleted

## 2020-05-31 ENCOUNTER — Other Ambulatory Visit: Payer: Self-pay | Admitting: *Deleted

## 2020-05-31 LAB — CULTURE, BLOOD (ROUTINE X 2)
Culture: NO GROWTH
Culture: NO GROWTH
Special Requests: ADEQUATE

## 2020-05-31 NOTE — Patient Outreach (Signed)
[facility]) Care Management  05/31/2020  Mandy Roberts [birth year] [id number]   Transition of care call/case closure   Referral received:05/29/20 Initial outreach:05/31/20 Insurance: [place] [employer]    Subjective: Initial successful telephone call to patient's preferred number in order to complete transition of care assessment; 2 HIPAA identifiers verified. Explained purpose of call and completed transition of care assessment.  Mandy Roberts states that she is doing great, states that she has returned to work.. She reports seeing dentist on day of discharge and has plans to see ENT in the next 2 weeks. She report pain controlled, she is tolerating diet. She continues to have some swelling at facial area, no increase, no increase, no fever. pain well managed with prescribed medications  She denies any ongoing health issues and says she does not need a referral to one of the [place] chronic disease management programs.  She does have  hospital indemnity plan and will file claim.  She uses a  [facility] outpatient pharmacy at [place].      Objective:  Mandy Roberts  was hospitalized at [facility] from 7/3-7/6  For  Abscess of left mandible  with   facial cellulitis  Comorbidities include: s/p dental extraction 05/24/20  Obesity , left breast CA, s/p knee replacement.  She was discharged to home on 05/30/20 without the need for home health services or DME.   Assessment:  Patient voices good understanding of all discharge instructions.  See transition of care flowsheet for assessment details.   Plan:  Reviewed hospital discharge diagnosis of Facial cellulitis, abscess of left mandible   and discharge treatment plan using hospital discharge instructions, assessing medication adherence, reviewing problems requiring provider notification, and discussing the importance of follow up with surgeon, primary care provider and/or specialists as  directed.  Reviewed [place] healthy lifestyle program information to receive discounted premium for  2022   Step 1: Get  your annual physical  Step 2: Complete your health assessment  Step 3:Identify your current health status and complete the corresponding action step between January 1, and July 25, 2020.    No ongoing care management needs identified so will close case to [facility] [employer] services and route successful outreach letter with [place] [employer] pamphlet and 24 Hour Nurse Line Magnet to [place] clinical pool to be mailed to patient's home address.  Thanked patient for their services to [facility].   Joylene Draft, RN, BSN  [place] Management Coordinator  [phone]- Mobile [phone]- Toll Free Main Office

## 2020-06-05 DIAGNOSIS — K047 Periapical abscess without sinus: Secondary | ICD-10-CM | POA: Diagnosis not present

## 2020-06-15 ENCOUNTER — Other Ambulatory Visit: Payer: Self-pay | Admitting: Internal Medicine

## 2020-06-15 DIAGNOSIS — R030 Elevated blood-pressure reading, without diagnosis of hypertension: Secondary | ICD-10-CM | POA: Diagnosis not present

## 2020-06-15 DIAGNOSIS — F411 Generalized anxiety disorder: Secondary | ICD-10-CM | POA: Diagnosis not present

## 2020-06-15 DIAGNOSIS — R69 Illness, unspecified: Secondary | ICD-10-CM | POA: Diagnosis not present

## 2020-07-16 ENCOUNTER — Other Ambulatory Visit: Payer: Self-pay | Admitting: Internal Medicine

## 2020-07-16 DIAGNOSIS — I10 Essential (primary) hypertension: Secondary | ICD-10-CM | POA: Diagnosis not present

## 2020-07-16 DIAGNOSIS — R7302 Impaired glucose tolerance (oral): Secondary | ICD-10-CM | POA: Diagnosis not present

## 2020-07-16 DIAGNOSIS — E669 Obesity, unspecified: Secondary | ICD-10-CM | POA: Diagnosis not present

## 2020-07-16 DIAGNOSIS — E559 Vitamin D deficiency, unspecified: Secondary | ICD-10-CM | POA: Diagnosis not present

## 2020-08-13 DIAGNOSIS — H0012 Chalazion right lower eyelid: Secondary | ICD-10-CM | POA: Diagnosis not present

## 2020-09-10 DIAGNOSIS — R7302 Impaired glucose tolerance (oral): Secondary | ICD-10-CM | POA: Diagnosis not present

## 2020-09-10 DIAGNOSIS — F411 Generalized anxiety disorder: Secondary | ICD-10-CM | POA: Diagnosis not present

## 2020-09-10 DIAGNOSIS — E559 Vitamin D deficiency, unspecified: Secondary | ICD-10-CM | POA: Diagnosis not present

## 2020-09-17 DIAGNOSIS — C50912 Malignant neoplasm of unspecified site of left female breast: Secondary | ICD-10-CM | POA: Diagnosis not present

## 2020-09-28 DIAGNOSIS — C50912 Malignant neoplasm of unspecified site of left female breast: Secondary | ICD-10-CM | POA: Diagnosis not present

## 2020-10-08 ENCOUNTER — Other Ambulatory Visit: Payer: Self-pay | Admitting: Internal Medicine

## 2020-10-16 ENCOUNTER — Other Ambulatory Visit: Payer: Self-pay | Admitting: Emergency Medicine

## 2020-11-06 ENCOUNTER — Ambulatory Visit: Payer: 59

## 2020-11-13 ENCOUNTER — Other Ambulatory Visit: Payer: Self-pay

## 2020-11-13 ENCOUNTER — Ambulatory Visit
Admission: RE | Admit: 2020-11-13 | Discharge: 2020-11-13 | Disposition: A | Discharge status: Home / Self Care | Payer: 59 | Source: Ambulatory Visit | Attending: General Surgery | Admitting: General Surgery

## 2020-11-13 DIAGNOSIS — Z1231 Encounter for screening mammogram for malignant neoplasm of breast: Secondary | ICD-10-CM | POA: Insufficient documentation

## 2020-11-13 DIAGNOSIS — C801 Malignant (primary) neoplasm, unspecified: Secondary | ICD-10-CM | POA: Diagnosis not present

## 2020-11-15 DIAGNOSIS — Z853 Personal history of malignant neoplasm of breast: Secondary | ICD-10-CM | POA: Diagnosis not present

## 2020-11-15 DIAGNOSIS — Z86010 Personal history of colonic polyps: Secondary | ICD-10-CM | POA: Diagnosis not present

## 2020-11-20 ENCOUNTER — Ambulatory Visit: Payer: 59 | Attending: Internal Medicine

## 2020-11-20 ENCOUNTER — Other Ambulatory Visit: Payer: Self-pay | Admitting: Internal Medicine

## 2020-11-20 DIAGNOSIS — Z23 Encounter for immunization: Secondary | ICD-10-CM

## 2020-11-20 NOTE — Progress Notes (Signed)
   Covid-19 Vaccination Clinic  Name:  Mandy Roberts    MRN: [id number] DOB: [birth year]  11/20/2020  Mandy Roberts was observed post Covid-19 immunization for 15 minutes without incident. She was provided with Vaccine Information Sheet and instruction to access the V-Safe system.   Mandy Roberts was instructed to call 911 with any severe reactions post vaccine: Marland Kitchen Difficulty breathing  . Swelling of face and throat  . A fast heartbeat  . A bad rash all over body  . Dizziness and weakness   Immunizations Administered    Name Date Dose VIS Date Route   Pfizer COVID-19 Vaccine 11/20/2020  2:05 PM 0.3 mL 09/12/2020 Intramuscular   Manufacturer: [employer], [employer]   Lot: [id number]   NDC: [address]-8

## 2021-03-04 DIAGNOSIS — S93401A Sprain of unspecified ligament of right ankle, initial encounter: Secondary | ICD-10-CM | POA: Diagnosis not present

## 2021-04-14 DIAGNOSIS — Z20822 Contact with and (suspected) exposure to covid-19: Secondary | ICD-10-CM | POA: Diagnosis not present

## 2021-05-10 ENCOUNTER — Other Ambulatory Visit: Payer: Self-pay

## 2021-05-10 DIAGNOSIS — E559 Vitamin D deficiency, unspecified: Secondary | ICD-10-CM | POA: Diagnosis not present

## 2021-05-10 DIAGNOSIS — Z01810 Encounter for preprocedural cardiovascular examination: Secondary | ICD-10-CM | POA: Diagnosis not present

## 2021-05-10 DIAGNOSIS — R7302 Impaired glucose tolerance (oral): Secondary | ICD-10-CM | POA: Diagnosis not present

## 2021-05-10 DIAGNOSIS — Z0000 Encounter for general adult medical examination without abnormal findings: Secondary | ICD-10-CM | POA: Diagnosis not present

## 2021-05-10 DIAGNOSIS — I340 Nonrheumatic mitral (valve) insufficiency: Secondary | ICD-10-CM | POA: Diagnosis not present

## 2021-05-10 DIAGNOSIS — E538 Deficiency of other specified B group vitamins: Secondary | ICD-10-CM | POA: Diagnosis not present

## 2021-05-10 MED ORDER — FUROSEMIDE 20 MG PO TABS
ORAL_TABLET | Freq: Every day | ORAL | 2 refills | Status: DC
  Filled 2021-05-10: qty 60, 60d supply, fill #0

## 2021-05-13 ENCOUNTER — Other Ambulatory Visit: Payer: Self-pay

## 2021-05-13 MED ORDER — VITAMIN D3 1.25 MG (50000 UT) PO CAPS
ORAL_CAPSULE | ORAL | 3 refills | Status: DC
  Filled 2021-05-13: qty 12, 84d supply, fill #0
  Filled 2022-04-15: qty 12, 84d supply, fill #1

## 2021-05-13 MED ORDER — PHENTERMINE HCL 37.5 MG PO TABS
37.5000 mg | ORAL_TABLET | Freq: Every day | ORAL | 3 refills | Status: DC
  Filled 2021-05-13: qty 30, 30d supply, fill #0
  Filled 2021-07-22: qty 30, 30d supply, fill #1
  Filled 2021-08-30: qty 30, 30d supply, fill #2

## 2021-05-20 DIAGNOSIS — I340 Nonrheumatic mitral (valve) insufficiency: Secondary | ICD-10-CM | POA: Diagnosis not present

## 2021-05-23 ENCOUNTER — Other Ambulatory Visit: Payer: Self-pay

## 2021-05-23 DIAGNOSIS — I10 Essential (primary) hypertension: Secondary | ICD-10-CM | POA: Diagnosis not present

## 2021-05-23 DIAGNOSIS — I429 Cardiomyopathy, unspecified: Secondary | ICD-10-CM | POA: Diagnosis not present

## 2021-05-23 DIAGNOSIS — E782 Mixed hyperlipidemia: Secondary | ICD-10-CM | POA: Diagnosis not present

## 2021-05-23 MED ORDER — CARVEDILOL 3.125 MG PO TABS
ORAL_TABLET | Freq: Two times a day (BID) | ORAL | 0 refills | Status: DC
  Filled 2021-05-23: qty 60, 30d supply, fill #0

## 2021-05-23 MED ORDER — ENTRESTO 24-26 MG PO TABS
ORAL_TABLET | Freq: Two times a day (BID) | ORAL | 0 refills | Status: DC
  Filled 2021-05-23: qty 60, 30d supply, fill #0

## 2021-06-03 ENCOUNTER — Other Ambulatory Visit: Payer: Self-pay

## 2021-06-03 ENCOUNTER — Ambulatory Visit (HOSPITAL_COMMUNITY)
Admission: RE | Admit: 2021-06-03 | Discharge: 2021-06-03 | Disposition: A | Discharge status: Home / Self Care | Payer: 59 | Source: Ambulatory Visit | Attending: Internal Medicine | Admitting: Internal Medicine

## 2021-06-03 VITALS — BP 112/80 | HR 79 | Ht 66.0 in | Wt 216.0 lb

## 2021-06-03 DIAGNOSIS — I10 Essential (primary) hypertension: Secondary | ICD-10-CM

## 2021-06-03 DIAGNOSIS — Z923 Personal history of irradiation: Secondary | ICD-10-CM | POA: Diagnosis not present

## 2021-06-03 DIAGNOSIS — Z880 Allergy status to penicillin: Secondary | ICD-10-CM | POA: Insufficient documentation

## 2021-06-03 DIAGNOSIS — G4733 Obstructive sleep apnea (adult) (pediatric): Secondary | ICD-10-CM | POA: Diagnosis not present

## 2021-06-03 DIAGNOSIS — I509 Heart failure, unspecified: Secondary | ICD-10-CM | POA: Diagnosis not present

## 2021-06-03 DIAGNOSIS — I5022 Chronic systolic (congestive) heart failure: Secondary | ICD-10-CM | POA: Diagnosis not present

## 2021-06-03 DIAGNOSIS — Z8616 Personal history of COVID-19: Secondary | ICD-10-CM | POA: Insufficient documentation

## 2021-06-03 DIAGNOSIS — Z01810 Encounter for preprocedural cardiovascular examination: Secondary | ICD-10-CM

## 2021-06-03 DIAGNOSIS — Z9884 Bariatric surgery status: Secondary | ICD-10-CM | POA: Insufficient documentation

## 2021-06-03 DIAGNOSIS — Z8249 Family history of ischemic heart disease and other diseases of the circulatory system: Secondary | ICD-10-CM | POA: Insufficient documentation

## 2021-06-03 DIAGNOSIS — Z79899 Other long term (current) drug therapy: Secondary | ICD-10-CM | POA: Diagnosis not present

## 2021-06-03 DIAGNOSIS — Z887 Allergy status to serum and vaccine status: Secondary | ICD-10-CM | POA: Insufficient documentation

## 2021-06-03 DIAGNOSIS — I110 Hypertensive heart disease with heart failure: Secondary | ICD-10-CM | POA: Insufficient documentation

## 2021-06-03 NOTE — Patient Instructions (Signed)
You are cleared for your surgery  Your physician recommends that you schedule a follow-up appointment in: 4 months with an echocardiogram  If you have any questions or concerns before your next appointment please send [place] a message through [place] or call our office at [phone].    TO LEAVE A MESSAGE FOR THE NURSE SELECT OPTION 2, PLEASE LEAVE A MESSAGE INCLUDING: YOUR NAME DATE OF BIRTH CALL BACK NUMBER REASON FOR CALL**this is important as we prioritize the call backs  YOU WILL RECEIVE A CALL BACK THE SAME DAY AS LONG AS YOU CALL BEFORE 4:00 PM

## 2021-06-03 NOTE — Progress Notes (Signed)
ADVANCED HF CLINIC CONSULT NOTE  Referring 80, Neelam Primary Care: Lamonte Sakai Primary Cardiologist: Andi Devon  HPI: Mandy Roberts is a 60 yo female with PMH of OSA, PAF, Obesity w/hx of gastric bypass, Venous insufficiency, hx of breast cancer 1990 s/p left mastectomy, chemo 5FU/AC, and radiation therapy.    Echocardiogram August 2008 showing ejection fraction 40-45%, G1DD  Prior stress test July 2011 showing attenuation artifact in the anterior wall, ejection fraction 50%. Never had a cath.   Echocardiogram July 2011 showing ejection fraction 45-50%, moderate LVH  08/2013 saw Dr. Rockey Situ outpatient for palpitations.  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  She was started on metoprolol and eliquis.  Origin was thought to be severe OSA and possibly a component of diastolic CHF.    Managed in the outpatient setting since then one visit with cardiology Dr. Humphrey Rolls in 2018 in [place] but I cannot view the note.  Many visits to vascular for venous insufficiency.    In 2016 had bariatric surgery with duodenal switch in [facility]. No peri-op complications.   Tested positive for covid 19 on 04/14/21.  Had an ECHO 05/23/21 with EF 44%, mildly dilated hypokinetic septal wall, G1DD, mildly dilated RV. She has lost 100 pounds and is pending cosmetic surgery to remove loose skin. Dr. Chancy Milroy ordered Myoview but she wated to come here for a second opinion.   Says she feels great. 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  Walks on treadmill for 1 mile routinely in 20 mins with a little incline. No CP. Does not wear CPAP after weight loss from bypass.  Mainly here for surgical clearance and to see if a stress test is warranted.  Takes lasix prn 20mg , took it once on 4th of July weekend otherwise has not needed  it in some time.     Review of Systems: [y] = yes, [ ]  = no   General: Weight gain [ ] ; Weight loss [ ] ; Anorexia [ ] ; Fatigue Blue.Reese ]; Fever [ ] ; Chills [ ] ; Weakness [ ]   Cardiac: Chest pain/pressure [ ] ; Resting SOB [ ] ; Exertional SOB [ ] ; Orthopnea [ ] ; Pedal Edema [ ] ; Palpitations [ ] ; Syncope [ ] ; Presyncope [ ] ; Paroxysmal nocturnal dyspnea[ ]   Pulmonary: Cough [ ] ; Wheezing[ ] ; Hemoptysis[ ] ; Sputum [ ] ; Snoring [ ]   GI: Vomiting[ ] ; Dysphagia[ ] ; Melena[ ] ; Hematochezia [ ] ; Heartburn[ ] ; Abdominal pain [ ] ; Constipation [ ] ; Diarrhea [ ] ; BRBPR [ ]   GU: Hematuria[ ] ; Dysuria [ ] ; Nocturia[ ]   Vascular: Pain in legs with walking [ ] ; Pain in feet with lying flat [ ] ; Non-healing sores [ ] ; Stroke [ ] ; TIA [ ] ; Slurred speech [ ] ;  Neuro: Headaches[ ] ; Vertigo[ ] ; Seizures[ ] ; Paresthesias[ ] ;Blurred vision [ ] ; Diplopia [ ] ; Vision changes [ ]   Ortho/Skin: Arthritis Blue.Reese ]; Joint pain Blue.Reese ]; Muscle pain [ ] ; Joint swelling [ ] ; Back Pain [ ] ; Rash [ ]   Psych: Depression[ ] ; Anxiety[ ]   Heme: Bleeding problems [ ] ; Clotting disorders [ ] ; Anemia [ ]   Endocrine: Diabetes [ ] ; Thyroid dysfunction[ ]    Past Medical History:  Diagnosis Date   Atrial fibrillation ([place])    Breast cancer ([place]) 1990/1997    left  mastectomy ,had recurrence in left suprclavic nodes several yrs ago-treated with radiation and chemo   Colon polyp 0102   Complication of anesthesia    Diabetes mellitus without complication ([place])    history of, loss of 105 lbs   Difficult intubation    expectorated blood 3-4 days after surgery in 2011   Difficult intubation    hypoxia with endoscopy in 2016   History of chemotherapy    5-FU/AC in 1990 under the care of Dr. Oliva Bustard. pt also tx with tamoxifen and arimidex   History of radiation therapy    radiation treat at [facility]   Hypertension 2008   Personal history of chemotherapy 1990   BREAST CA   Personal history of malignant neoplasm of breast 1990    left mastectomy     Personal history of radiation therapy 1997   BREAST CA   Sinusitis    Sleep apnea    history of, went away after gastric surgery    Current Outpatient Medications  Medication Sig Dispense Refill   albuterol (VENTOLIN HFA) 108 (90 Base) MCG/ACT inhaler INHALE 2 PUFFS INTO THE LUNGS FOUR TIMES A DAY 18 g 1   carvedilol (COREG) 3.125 MG tablet twice a day (Patient taking differently: Take 3.125 mg by mouth 2 (two) times daily.) 60 tablet 0   Cholecalciferol (VITAMIN D3) 1.25 MG (50000 UT) CAPS once a week (Patient taking differently: Take 1 tablet by mouth once a week.) 12 capsule 3   COVID-19 mRNA vaccine, Pfizer, 30 MCG/0.3ML injection USE AS DIRECTED .3 mL 0   furosemide (LASIX) 20 MG tablet TAKE 1 TABLET BY MOUTH ONCE DAILY (Patient taking differently: Take 20 mg by mouth as needed.) 60 tablet 2   Multiple Vitamin (MULTIVITAMIN WITH MINERALS) TABS tablet Take 1 tablet by mouth daily.     NON FORMULARY Unifine Pentips 31 gauge x 3/16" needle     phentermine (ADIPEX-P) 37.5 MG tablet Take 1 tablet (37.5 mg total) by mouth daily. 30 tablet 3   sacubitril-valsartan (ENTRESTO) 24-26 MG twice a day (Patient taking differently: Take 1 tablet by mouth 2 (two) times daily.) 60 tablet 0   No current facility-administered medications for this encounter.    Allergies  Allergen Reactions   Tdap [Tetanus-Diphth-Acell Pertussis] Shortness Of Breath   Penicillins Rash    Has patient had a PCN reaction causing immediate rash, facial/tongue/throat swelling, SOB or lightheadedness with hypotension: Yes Has patient had a PCN reaction causing severe rash involving mucus membranes or skin necrosis: No Has patient had a PCN reaction that required hospitalization: No Has patient had a PCN reaction occurring within the last 10 years: No If all of the above answers are "NO", then may proceed with Cephalosporin use.       Social History   Socioeconomic History   Marital status: Widowed    Spouse  name: Not on file   Number of children: Not on file   Years of education: Not on file   Highest education level: Not on file  Occupational History   Not on file  Tobacco Use   Smoking status: Never   Smokeless tobacco: Never  Vaping Use   Vaping Use: Never used  Substance and Sexual Activity   Alcohol use: No   Drug use: No   Sexual activity: Not on file  Other Topics Concern   Not on file  Social History Narrative   Not on file   Social Determinants of Health   Financial Resource Strain:  Not on file  Food Insecurity: Not on file  Transportation Needs: Not on file  Physical Activity: Not on file  Stress: Not on file  Social Connections: Not on file  Intimate Partner Violence: Not on file      Family History  Problem Relation Age of Onset   Heart failure Mother    Hypertension Mother    Hyperlipidemia Mother    Kidney disease Mother    CVA Mother    Hypothyroidism Mother    Breast cancer Maternal Aunt 76    Vitals:   06/03/21 0950  BP: 112/80  Pulse: 79  SpO2: 98%  Weight: 98 kg (216 lb)  Height: 5\' 6"  (1.676 m)    PHYSICAL EXAM: General:  Well appearing. No resp difficulty HEENT: normal Neck: supple. no JVD. Carotids 2+ bilat; no bruits. No lymphadenopathy or thryomegaly appreciated. Cor: PMI nondisplaced. Regular rate & rhythm. No rubs, gallops or murmurs. Lungs: clear Abdomen: soft, nontender, nondistended. No hepatosplenomegaly. No bruits or masses. Good bowel sounds. Extremities: no cyanosis, clubbing, rash, edema Neuro: alert & orientedx3, cranial nerves grossly intact. moves all 4 extremities w/o difficulty. Affect pleasant   ECG: NSR 80 LVH No ST-T wave abnormalities. Personally reviewed   ASSESSMENT & PLAN:  1. Pre-operative CV risk stratification - LVEF is mildly reduced but this is table from previous. She is active without any signs/symptoms of ischemia and has tolerated recent surgeries without any problems - I think she is low risk  for surgery and can proceed without further CV testing  2. Chronic HF - HFmrEF - EF stable 40-45% - NYHA I-II - Volume status looks good with prn lasix  - Continue carvedilol and Entresto - can consider SGLT2i and spiro when she returns   3. OSA: -stopped using cpap after weight loss but has not had formal sleep study  4. HTN: -well controlled on entresto and carvedilol   Glori Bickers, MD  11:31 PM

## 2021-06-11 ENCOUNTER — Other Ambulatory Visit: Payer: Self-pay

## 2021-06-11 MED ORDER — CARESTART COVID-19 HOME TEST VI KIT
PACK | 0 refills | Status: DC
Start: 2021-06-11 — End: 2021-10-04
  Filled 2021-06-11: qty 4, 4d supply, fill #0

## 2021-07-22 ENCOUNTER — Other Ambulatory Visit: Payer: Self-pay | Admitting: Emergency Medicine

## 2021-07-22 ENCOUNTER — Other Ambulatory Visit: Payer: Self-pay

## 2021-07-22 MED ORDER — FUROSEMIDE 20 MG PO TABS
ORAL_TABLET | Freq: Every day | ORAL | 2 refills | Status: DC
  Filled 2021-07-22: qty 60, 60d supply, fill #0
  Filled 2021-08-30: qty 60, 60d supply, fill #1

## 2021-07-22 MED ORDER — CARVEDILOL 3.125 MG PO TABS
ORAL_TABLET | Freq: Two times a day (BID) | ORAL | 0 refills | Status: DC
  Filled 2021-07-22: qty 60, 30d supply, fill #0

## 2021-07-22 MED ORDER — SACUBITRIL-VALSARTAN 24-26 MG PO TABS
ORAL_TABLET | Freq: Two times a day (BID) | ORAL | 0 refills | Status: DC
  Filled 2021-07-22 (×2): qty 60, 30d supply, fill #0

## 2021-07-25 ENCOUNTER — Other Ambulatory Visit: Payer: Self-pay

## 2021-07-25 MED ORDER — ALBUTEROL SULFATE HFA 108 (90 BASE) MCG/ACT IN AERS
INHALATION_SPRAY | RESPIRATORY_TRACT | 1 refills | Status: DC
  Filled 2021-07-25: qty 18, 30d supply, fill #0
  Filled 2021-08-12: qty 18, 25d supply, fill #0
  Filled 2021-08-30: qty 18, 25d supply, fill #1

## 2021-08-05 ENCOUNTER — Other Ambulatory Visit: Payer: Self-pay

## 2021-08-09 DIAGNOSIS — I10 Essential (primary) hypertension: Secondary | ICD-10-CM | POA: Diagnosis not present

## 2021-08-09 DIAGNOSIS — I429 Cardiomyopathy, unspecified: Secondary | ICD-10-CM | POA: Diagnosis not present

## 2021-08-09 DIAGNOSIS — R7302 Impaired glucose tolerance (oral): Secondary | ICD-10-CM | POA: Diagnosis not present

## 2021-08-09 DIAGNOSIS — E559 Vitamin D deficiency, unspecified: Secondary | ICD-10-CM | POA: Diagnosis not present

## 2021-08-12 ENCOUNTER — Other Ambulatory Visit: Payer: Self-pay

## 2021-08-12 MED ORDER — VITAMIN D3 1.25 MG (50000 UT) PO CAPS
ORAL_CAPSULE | ORAL | 3 refills | Status: DC
  Filled 2021-08-12: qty 12, 84d supply, fill #0

## 2021-08-30 ENCOUNTER — Other Ambulatory Visit: Payer: Self-pay

## 2021-09-02 ENCOUNTER — Encounter: Payer: Self-pay | Admitting: General Surgery

## 2021-09-03 ENCOUNTER — Other Ambulatory Visit: Payer: Self-pay

## 2021-09-09 ENCOUNTER — Other Ambulatory Visit: Payer: Self-pay

## 2021-09-10 ENCOUNTER — Other Ambulatory Visit (HOSPITAL_COMMUNITY): Payer: Self-pay

## 2021-09-10 DIAGNOSIS — I5022 Chronic systolic (congestive) heart failure: Secondary | ICD-10-CM

## 2021-10-02 ENCOUNTER — Other Ambulatory Visit: Payer: Self-pay | Admitting: General Surgery

## 2021-10-02 DIAGNOSIS — Z1231 Encounter for screening mammogram for malignant neoplasm of breast: Secondary | ICD-10-CM

## 2021-10-04 ENCOUNTER — Ambulatory Visit (HOSPITAL_COMMUNITY)
Admission: RE | Admit: 2021-10-04 | Discharge: 2021-10-04 | Disposition: A | Discharge status: Home / Self Care | Payer: 59 | Source: Ambulatory Visit | Attending: Internal Medicine | Admitting: Internal Medicine

## 2021-10-04 ENCOUNTER — Other Ambulatory Visit: Payer: Self-pay

## 2021-10-04 ENCOUNTER — Other Ambulatory Visit (HOSPITAL_COMMUNITY): Payer: Self-pay | Admitting: Internal Medicine

## 2021-10-04 ENCOUNTER — Ambulatory Visit (HOSPITAL_BASED_OUTPATIENT_CLINIC_OR_DEPARTMENT_OTHER)
Admission: RE | Admit: 2021-10-04 | Discharge: 2021-10-04 | Disposition: A | Discharge status: Home / Self Care | Payer: 59 | Source: Ambulatory Visit | Attending: Internal Medicine | Admitting: Internal Medicine

## 2021-10-04 ENCOUNTER — Encounter (HOSPITAL_COMMUNITY): Payer: Self-pay | Admitting: Internal Medicine

## 2021-10-04 VITALS — BP 150/100 | HR 66 | Wt 212.0 lb

## 2021-10-04 DIAGNOSIS — Z9012 Acquired absence of left breast and nipple: Secondary | ICD-10-CM | POA: Diagnosis not present

## 2021-10-04 DIAGNOSIS — I872 Venous insufficiency (chronic) (peripheral): Secondary | ICD-10-CM | POA: Insufficient documentation

## 2021-10-04 DIAGNOSIS — F40240 Claustrophobia: Secondary | ICD-10-CM | POA: Insufficient documentation

## 2021-10-04 DIAGNOSIS — Z79899 Other long term (current) drug therapy: Secondary | ICD-10-CM | POA: Diagnosis not present

## 2021-10-04 DIAGNOSIS — Z853 Personal history of malignant neoplasm of breast: Secondary | ICD-10-CM | POA: Diagnosis not present

## 2021-10-04 DIAGNOSIS — I10 Essential (primary) hypertension: Secondary | ICD-10-CM

## 2021-10-04 DIAGNOSIS — Z9884 Bariatric surgery status: Secondary | ICD-10-CM | POA: Diagnosis not present

## 2021-10-04 DIAGNOSIS — I480 Paroxysmal atrial fibrillation: Secondary | ICD-10-CM | POA: Diagnosis not present

## 2021-10-04 DIAGNOSIS — Z8616 Personal history of COVID-19: Secondary | ICD-10-CM | POA: Diagnosis not present

## 2021-10-04 DIAGNOSIS — G4733 Obstructive sleep apnea (adult) (pediatric): Secondary | ICD-10-CM | POA: Insufficient documentation

## 2021-10-04 DIAGNOSIS — I5022 Chronic systolic (congestive) heart failure: Secondary | ICD-10-CM | POA: Insufficient documentation

## 2021-10-04 DIAGNOSIS — Z841 Family history of disorders of kidney and ureter: Secondary | ICD-10-CM | POA: Insufficient documentation

## 2021-10-04 DIAGNOSIS — R002 Palpitations: Secondary | ICD-10-CM

## 2021-10-04 DIAGNOSIS — E119 Type 2 diabetes mellitus without complications: Secondary | ICD-10-CM | POA: Diagnosis not present

## 2021-10-04 DIAGNOSIS — Z923 Personal history of irradiation: Secondary | ICD-10-CM | POA: Insufficient documentation

## 2021-10-04 DIAGNOSIS — R0683 Snoring: Secondary | ICD-10-CM | POA: Diagnosis not present

## 2021-10-04 DIAGNOSIS — Z9221 Personal history of antineoplastic chemotherapy: Secondary | ICD-10-CM | POA: Insufficient documentation

## 2021-10-04 DIAGNOSIS — E669 Obesity, unspecified: Secondary | ICD-10-CM | POA: Insufficient documentation

## 2021-10-04 DIAGNOSIS — I110 Hypertensive heart disease with heart failure: Secondary | ICD-10-CM | POA: Insufficient documentation

## 2021-10-04 LAB — COMPREHENSIVE METABOLIC PANEL
ALT: 28 U/L (ref 0–44)
AST: 22 U/L (ref 15–41)
Albumin: 3.7 g/dL (ref 3.5–5.0)
Alkaline Phosphatase: 103 U/L (ref 38–126)
Anion gap: 7 (ref 5–15)
BUN: 11 mg/dL (ref 6–20)
CO2: 29 mmol/L (ref 22–32)
Calcium: 9.4 mg/dL (ref 8.9–10.3)
Chloride: 104 mmol/L (ref 98–111)
Creatinine, Ser: 0.71 mg/dL (ref 0.44–1.00)
GFR, Estimated: >60 mL/min (ref >60)
Glucose, Bld: 91 mg/dL (ref 70–99)
Potassium: 4.5 mmol/L (ref 3.5–5.1)
Sodium: 140 mmol/L (ref 135–145)
Total Bilirubin: 0.8 mg/dL (ref 0.3–1.2)
Total Protein: 6.9 g/dL (ref 6.5–8.1)

## 2021-10-04 LAB — ECHOCARDIOGRAM COMPLETE
Area-P 1/2: 3.42 cm2
Calc EF: 45.9 %
S' Lateral: 3.7 cm
Single Plane A2C EF: 45.6 %
Single Plane A4C EF: 46.8 %

## 2021-10-04 LAB — BRAIN NATRIURETIC PEPTIDE: B Natriuretic Peptide: 56.6 pg/mL (ref 0.0–100.0)

## 2021-10-04 MED ORDER — FUROSEMIDE 20 MG PO TABS
20.0000 mg | ORAL_TABLET | Freq: Every day | ORAL | 11 refills | Status: DC | PRN
  Filled 2021-10-04 – 2021-11-08 (×2): qty 30, 30d supply, fill #0
  Filled 2022-01-13: qty 30, 30d supply, fill #1
  Filled 2022-02-07: qty 30, 30d supply, fill #2
  Filled 2022-04-15: qty 30, 30d supply, fill #3
  Filled 2022-05-19: qty 30, 30d supply, fill #4
  Filled 2022-07-22 – 2022-08-28 (×2): qty 30, 30d supply, fill #5

## 2021-10-04 MED ORDER — ENTRESTO 97-103 MG PO TABS
1.0000 | ORAL_TABLET | Freq: Two times a day (BID) | ORAL | 6 refills | Status: DC
  Filled 2021-10-04: qty 60, 30d supply, fill #0
  Filled 2021-11-08: qty 60, 30d supply, fill #1
  Filled 2022-01-13: qty 60, 30d supply, fill #2
  Filled 2022-02-07 – 2022-05-19 (×2): qty 60, 30d supply, fill #3
  Filled 2022-06-16: qty 60, 30d supply, fill #4
  Filled 2022-07-22 – 2022-08-28 (×2): qty 60, 30d supply, fill #5
  Filled 2022-09-21: qty 60, 30d supply, fill #6

## 2021-10-04 MED ORDER — CARVEDILOL 3.125 MG PO TABS
ORAL_TABLET | Freq: Two times a day (BID) | ORAL | 3 refills | Status: DC
  Filled 2021-10-04: qty 180, 90d supply, fill #0
  Filled 2022-01-13: qty 180, 90d supply, fill #1
  Filled 2022-02-07 – 2022-05-19 (×2): qty 180, 90d supply, fill #2
  Filled 2022-07-22 – 2022-08-28 (×2): qty 180, 90d supply, fill #3

## 2021-10-04 NOTE — Patient Instructions (Signed)
Increase Entresto to 97/103 mg Twice daily   Labs done today, your results will be available in MyChart, we will contact you for abnormal readings.  Your physician has requested that you have cardiac CT. Cardiac computed tomography (CT) is a painless test that uses an x-ray machine to take clear, detailed pictures of your heart. For further information please visit [email or web address]. Please follow instruction sheet as given. ONCE APPROVED BY YOUR INSURANCE COMPANY WE WILL CALL YOU TO SCHEDULE  Your provider has recommended that  you wear a Zio Patch for 14 days.  This monitor will record your heart rhythm for our review.  IF you have any symptoms while wearing the monitor please press the button.  If you have any issues with the patch or you notice a red or orange light on it please call the company at [phone].  Once you remove the patch please mail it back to the company as soon as possible so we can get the results.  Your provider has recommended that you have a home sleep study.  We have provided you with the equipment in our office today. Please download the app and follow the instructions. YOUR PIN NUMBER IS: [id number]. Once you have completed the test you just dispose of the equipment, the information is automatically uploaded to [place] via blue-tooth technology. If your test is positive for sleep apnea and you need a home CPAP machine you will be contacted by Dr Theodosia Blender office [facility]) to set this up.  Your physician recommends that you schedule a follow-up appointment in: 6 months (May 2023), **PLEASE CALL OUR OFFICE IN MARCH TO SCHEDULE THIS APPOINTMENT  Do the following things EVERYDAY: Weigh yourself in the morning before breakfast. Write it down and keep it in a log. Take your medicines as prescribed Eat low salt foods--Limit salt (sodium) to 2000 mg per day.  Stay as active as you can everyday Limit all fluids for the day to less than 2 liters  If you have any questions or  concerns before your next appointment please send [place] a message through [place] or call our office at [phone].    TO LEAVE A MESSAGE FOR THE NURSE SELECT OPTION 2, PLEASE LEAVE A MESSAGE INCLUDING: YOUR NAME DATE OF BIRTH CALL BACK NUMBER REASON FOR CALL**this is important as we prioritize the call backs  YOU WILL RECEIVE A CALL BACK THE SAME DAY AS LONG AS YOU CALL BEFORE 4:00 PM  At the [facility], you and your health needs are our priority. As part of our continuing mission to provide you with exceptional heart care, we have created designated Provider Care Teams. These Care Teams include your primary Cardiologist (physician) and Advanced Practice Providers (APPs- Physician Assistants and Nurse Practitioners) who all work together to provide you with the care you need, when you need it.   You may see any of the following providers on your designated Care Team at your next follow up: Dr Glori Bickers Dr Haynes Kerns, NP Lyda Jester, [place] [facility] [place], [place] Audry Riles, PharmD   Please be sure to bring in all your medications bottles to every appointment.

## 2021-10-04 NOTE — Addendum Note (Signed)
Encounter addended by: Scarlette Calico, RN on: 10/04/2021 12:16 PM  Actions taken: Clinical Note Signed

## 2021-10-04 NOTE — Progress Notes (Addendum)
ADVANCED HF CLINIC CONSULT NOTE  Referring 60, Neelam Primary Care: Lamonte Sakai Primary Cardiologist: Andi Devon  HPI: Mandy Roberts is a 60 yo female with PMH of OSA, PAF, Obesity w/hx of gastric bypass, Venous insufficiency, hx of breast cancer 1990 s/p left mastectomy, chemo 5FU/AC, and radiation therapy.    Echocardiogram August 2008 showing ejection fraction 40-45%, G1DD  Prior stress test July 2011 showing attenuation artifact in the anterior wall, ejection fraction 50%. Never had a cath.   EchoJuly 2011 showing ejection fraction 45-50%, moderate LVH  08/2013 saw Dr. Rockey Situ outpatient for palpitations.  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  She was started on metoprolol and eliquis.  Origin was thought to be severe OSA and possibly a component of diastolic CHF.    Tested positive for covid 19 on 04/14/21.  Had an ECHO 05/23/21 with EF 44%, mildly dilated hypokinetic septal wall, G1DD, mildly dilated RV.   Had abdominoplasty in 7/22. Took 8.5 pounds of skin. No problem  Here for f/u. Says she feels good. Not exercising as much because her brother has been sick with renal failure. Denies CP, SOB or edema. Over past 2 weeks started having HAs. BP running 130-140 at work. Takes lasix as needed  Echo today 10/04/21  EF 45-50% Personally reviewed    Past Medical History:  Diagnosis Date   Atrial fibrillation ([place])    Breast cancer ([place]) 1990/1997    left mastectomy ,had recurrence in left suprclavic nodes several yrs ago-treated with radiation and chemo   Colon polyp 9983   Complication of anesthesia    Diabetes mellitus without complication ([place])    history of, loss of 105 lbs   Difficult intubation    expectorated blood 3-4 days after surgery in 2011   Difficult intubation    hypoxia with endoscopy in 2016   History of chemotherapy    5-FU/AC in  1990 under the care of Dr. Oliva Bustard. pt also tx with tamoxifen and arimidex   History of radiation therapy    radiation treat at [facility]   Hypertension 2008   Personal history of chemotherapy 1990   BREAST CA   Personal history of malignant neoplasm of breast 1990    left mastectomy    Personal history of radiation therapy 1997   BREAST CA   Sinusitis    Sleep apnea    history of, went away after gastric surgery    Current Outpatient Medications  Medication Sig Dispense Refill   albuterol (VENTOLIN HFA) 108 (90 Base) MCG/ACT inhaler INHALE 2 PUFFS INTO THE LUNGS FOUR TIMES A DAY 18 g 1   carvedilol (COREG) 3.125 MG tablet twice a day 60 tablet 0   Cholecalciferol (VITAMIN D3) 1.25 MG (50000 UT) CAPS once a week 12 capsule 3   Cholecalciferol (VITAMIN D3) 1.25 MG (50000 UT) CAPS once a week 12 capsule 3   furosemide (LASIX) 20 MG tablet Take 20 mg by mouth as needed.     Multiple Vitamin (MULTIVITAMIN WITH MINERALS) TABS tablet Take 1 tablet by mouth daily.     NON FORMULARY Unifine Pentips 31 gauge x 3/16" needle     phentermine (ADIPEX-P) 37.5 MG tablet Take 1 tablet (37.5 mg total) by mouth daily. 30 tablet 3   sacubitril-valsartan (ENTRESTO) 24-26 MG twice a day 60 tablet 0   No current facility-administered medications for this  encounter.    Allergies  Allergen Reactions   Tdap [Tetanus-Diphth-Acell Pertussis] Shortness Of Breath   Penicillins Rash    Has patient had a PCN reaction causing immediate rash, facial/tongue/throat swelling, SOB or lightheadedness with hypotension: Yes Has patient had a PCN reaction causing severe rash involving mucus membranes or skin necrosis: No Has patient had a PCN reaction that required hospitalization: No Has patient had a PCN reaction occurring within the last 10 years: No If all of the above answers are "NO", then may proceed with Cephalosporin use.       Social History   Socioeconomic History   Marital status: Widowed    Spouse name:  Not on file   Number of children: Not on file   Years of education: Not on file   Highest education level: Not on file  Occupational History   Not on file  Tobacco Use   Smoking status: Never   Smokeless tobacco: Never  Vaping Use   Vaping Use: Never used  Substance and Sexual Activity   Alcohol use: No   Drug use: No   Sexual activity: Not on file  Other Topics Concern   Not on file  Social History Narrative   Not on file   Social Determinants of Health   Financial Resource Strain: Not on file  Food Insecurity: Not on file  Transportation Needs: Not on file  Physical Activity: Not on file  Stress: Not on file  Social Connections: Not on file  Intimate Partner Violence: Not on file      Family History  Problem Relation Age of Onset   Heart failure Mother    Hypertension Mother    Hyperlipidemia Mother    Kidney disease Mother    CVA Mother    Hypothyroidism Mother    Breast cancer Maternal Aunt 91    Vitals:   10/04/21 0957  BP: (!) 150/100  Pulse: 66  SpO2: 98%  Weight: 96.2 kg (212 lb)    PHYSICAL EXAM: General:  Well appearing. No resp difficulty HEENT: normal Neck: supple. no JVD. Carotids 2+ bilat; no bruits. No lymphadenopathy or thryomegaly appreciated. Cor: PMI nondisplaced. Regular rate & rhythm. No rubs, gallops or murmurs. Lungs: clear Abdomen: obese soft, nontender, nondistended. No hepatosplenomegaly. No bruits or masses. Good bowel sounds. Extremities: no cyanosis, clubbing, rash, edema Neuro: alert & orientedx3, cranial nerves grossly intact. moves all 4 extremities w/o difficulty. Affect pleasant  ASSESSMENT & PLAN:  1. Chronic HF - HFmrEF - EF stable 40-45% Unclear etiology - Echo today 10/04/21 EF 45-50% - Unable to due MRI due to severe claustrophobia  - Stable NYHA I-II - Volume status looks good with prn lasix  - Continue carvedilol 3.125 bid - Increase Entresto to 97/103 with HTN - labs today - can consider SGLT2i and spiro  when she returns  - Will need cardiac CT to evaluate for potential ischemic cause  2. HTN - BP up. Increase Entesto as above  3. OSA: - stopped using cpap after weight loss but has not had formal sleep study - still snoring. Will place HST.  4. PAF - had one episode of AF that was caught on strip at work. No known AF since - will place Zio to see if any further AF. If so, will need to restart Eliquis  Glori Bickers, MD  10:27 AM

## 2021-10-04 NOTE — Progress Notes (Signed)
Zio patch placed onto patient.  All instructions and information reviewed with patient, they verbalize understanding with no questions. 

## 2021-10-04 NOTE — Addendum Note (Signed)
Encounter addended by: Scarlette Calico, RN on: 10/04/2021 11:15 AM  Actions taken: Visit diagnoses modified, Order list changed, Diagnosis association updated, Clinical Note Signed, Charge Capture section accepted

## 2021-10-04 NOTE — Addendum Note (Signed)
Encounter addended by: Scarlette Calico, RN on: 10/04/2021 11:37 AM  Actions taken: Order list changed, Diagnosis association updated

## 2021-10-04 NOTE — Progress Notes (Signed)
Height: 5'6"    Weight: 212 lb BMI: 34.2   Today's Date: 10/04/21  STOP BANG RISK ASSESSMENT S (snore) Have you been told that you snore?     YES   T (tired) Are you often tired, fatigued, or sleepy during the day?   YES  O (obstruction) Do you stop breathing, choke, or gasp during sleep? NO   P (pressure) Do you have or are you being treated for high blood pressure? NO   B (BMI) Is your body index greater than 35 kg/m? NO   A (age) Are you 60 years old or older? YES   N (neck) Do you have a neck circumference greater than 16 inches?      G (gender) Are you a female? NO   TOTAL STOP/BANG "YES" ANSWERS 3                                                                       For Office Use Only              Procedure Order Form    YES to 3+ Stop Bang questions OR two clinical symptoms - patient qualifies for WatchPAT (CPT 95800)      Clinical Notes: Will consult Sleep Specialist and refer for management of therapy due to patient increased risk of Sleep Apnea. Ordering a sleep study due to the following two clinical symptoms: Excessive daytime sleepiness G47.10 /  Loud snoring R06.83

## 2021-10-04 NOTE — Progress Notes (Signed)
  Echocardiogram 2D Echocardiogram has been performed.  Mandy Roberts 10/04/2021, 9:49 AM

## 2021-10-04 NOTE — Addendum Note (Signed)
Encounter addended by: Jerl Mina, RN on: 10/04/2021 10:53 AM  Actions taken: Order list changed, Diagnosis association updated

## 2021-10-10 ENCOUNTER — Telehealth (HOSPITAL_COMMUNITY): Payer: Self-pay | Admitting: Surgery

## 2021-10-10 NOTE — Telephone Encounter (Signed)
I attempted to contact patient to let her know to proceed with ordered home sleep study as insurance pre cert not req.  I left a brief message to indicate this and requested a return call.

## 2021-10-16 ENCOUNTER — Other Ambulatory Visit: Payer: Self-pay

## 2021-10-16 ENCOUNTER — Other Ambulatory Visit (HOSPITAL_COMMUNITY): Payer: Self-pay | Admitting: Emergency Medicine

## 2021-10-16 ENCOUNTER — Encounter (HOSPITAL_COMMUNITY): Payer: Self-pay

## 2021-10-16 DIAGNOSIS — R002 Palpitations: Secondary | ICD-10-CM

## 2021-10-16 MED ORDER — METOPROLOL TARTRATE 100 MG PO TABS
100.0000 mg | ORAL_TABLET | Freq: Once | ORAL | 0 refills | Status: DC
  Filled 2021-10-16: qty 1, 1d supply, fill #0

## 2021-10-16 NOTE — Progress Notes (Signed)
One time dose 100mg  metoprolol tartrate prescribed to pharm on file [facility] outpatient pharm) for CCTA HR control Marchia Bond RN Navigator Cardiac Imaging [facility] [phone] Office  [phone] Cell

## 2021-10-18 ENCOUNTER — Telehealth (HOSPITAL_COMMUNITY): Payer: Self-pay | Admitting: Emergency Medicine

## 2021-10-18 NOTE — Telephone Encounter (Signed)
Reaching out to patient to offer assistance regarding upcoming cardiac imaging study; pt verbalizes understanding of appt date/time, parking situation and where to check in, pre-test NPO status and medications ordered, and verified current allergies; name and call back number provided for further questions should they arise Marchia Bond RN Navigator Cardiac Imaging [facility] [phone] office [phone] cell  Arrival 215 Denies iv issues 100mg  metoprolol tart

## 2021-10-21 ENCOUNTER — Other Ambulatory Visit: Payer: Self-pay

## 2021-10-21 ENCOUNTER — Ambulatory Visit
Admission: RE | Admit: 2021-10-21 | Discharge: 2021-10-21 | Disposition: A | Discharge status: Home / Self Care | Payer: 59 | Source: Ambulatory Visit | Attending: Internal Medicine | Admitting: Internal Medicine

## 2021-10-21 DIAGNOSIS — I5022 Chronic systolic (congestive) heart failure: Secondary | ICD-10-CM | POA: Insufficient documentation

## 2021-10-21 MED ORDER — NITROGLYCERIN 0.4 MG SL SUBL
0.8000 mg | SUBLINGUAL_TABLET | Freq: Once | SUBLINGUAL | Status: AC
  Administered 2021-10-21: 15:00:00 0.8 mg via SUBLINGUAL

## 2021-10-21 MED ORDER — IOHEXOL 350 MG/ML SOLN
100.0000 mL | Freq: Once | INTRAVENOUS | Status: AC | PRN
  Administered 2021-10-21: 15:00:00 100 mL via INTRAVENOUS

## 2021-10-21 MED ORDER — METOPROLOL TARTRATE 5 MG/5ML IV SOLN
5.0000 mg | Freq: Once | INTRAVENOUS | Status: AC
  Administered 2021-10-21: 15:00:00 5 mg via INTRAVENOUS

## 2021-10-21 NOTE — Progress Notes (Signed)
Patient tolerated procedure well. Ambulate w/o difficulty. Denies light headedness or being dizzy. Sitting in chair drinking water provided. Encouraged to drink extra water today and reasoning explained. Verbalized understanding. All questions answered. ABC intact. No further needs. Discharge from procedure area w/o issues.   °

## 2021-10-28 ENCOUNTER — Other Ambulatory Visit: Payer: Self-pay

## 2021-10-28 DIAGNOSIS — E668 Other obesity: Secondary | ICD-10-CM | POA: Diagnosis not present

## 2021-10-28 DIAGNOSIS — I509 Heart failure, unspecified: Secondary | ICD-10-CM | POA: Diagnosis not present

## 2021-10-28 MED ORDER — INSULIN PEN NEEDLE 32G X 6 MM MISC
0 refills | Status: DC
  Filled 2022-09-21: qty 100, 90d supply, fill #0

## 2021-10-28 MED ORDER — GLUCOSE BLOOD VI STRP: ORAL_STRIP | 0 refills | Status: DC

## 2021-10-28 MED ORDER — OZEMPIC (0.25 OR 0.5 MG/DOSE) 2 MG/1.5ML ~~LOC~~ SOPN
PEN_INJECTOR | SUBCUTANEOUS | 0 refills | Status: DC
  Filled 2021-10-28: qty 1.5, 28d supply, fill #0
  Filled 2021-11-08 – 2021-11-21 (×2): qty 1.5, 28d supply, fill #1

## 2021-10-29 ENCOUNTER — Telehealth (HOSPITAL_COMMUNITY): Payer: Self-pay | Admitting: Surgery

## 2021-10-29 NOTE — Telephone Encounter (Signed)
I called patient again to remind her of the ordered home sleep study.  She tells me that she will have her fingernail polish removed on Thursday and plans to complete after that time.

## 2021-10-30 DIAGNOSIS — R002 Palpitations: Secondary | ICD-10-CM | POA: Diagnosis not present

## 2021-10-30 NOTE — Addendum Note (Signed)
Encounter addended by: Micki Riley, RN on: 10/30/2021 2:36 PM  Actions taken: Imaging Exam ended

## 2021-11-07 ENCOUNTER — Encounter (HOSPITAL_BASED_OUTPATIENT_CLINIC_OR_DEPARTMENT_OTHER): Payer: 59 | Admitting: Cardiology

## 2021-11-07 DIAGNOSIS — G4733 Obstructive sleep apnea (adult) (pediatric): Secondary | ICD-10-CM

## 2021-11-08 ENCOUNTER — Other Ambulatory Visit: Payer: Self-pay

## 2021-11-09 NOTE — Procedures (Signed)
° °  Sleep Study Report  Patient Information  Study Date: 11/07/21 Patient Name: Mandy Roberts Patient ID: [id number] Birth Date: [birth year] Age: 60 Gender: Female Referring Physician: Glori Bickers, MD  TEST DESCRIPTION: Home sleep apnea testing was completed using the WatchPat, a Type 1 device, utilizing peripheral arterial tonometry (PAT), chest movement, actigraphy, pulse oximetry, pulse rate, body position and snore. AHI was calculated with apnea and hypopnea using valid sleep time as the denominator. RDI includes apneas, hypopneas, and RERAs. The data acquired and the scoring of sleep and all associated events were performed in accordance with the recommended standards and specifications as outlined in the AASM Manual for the Scoring of Sleep and Associated Events 2.2.0 (2015).  FINDINGS: 1. Mild Obstructive Sleep Apnea with AHI 12.1/hr. 2. No Central Sleep Apnea with pAHIc 0.9/hr. 3. Oxygen desaturations as low as 76%. 4. Minimal snoring was present. O2 sats were < 88% for 0.9 min. 5. Total sleep time was 7 hrs and 35 min. 6. 18.4% of total sleep time was spent in REM sleep. 7. Normal sleep onset latency at 24 min. 8. Shortened REM sleep onset latency at 61 min. 9. Total awakenings were 4.  DIAGNOSIS: Mild Obstructive Sleep Apnea (G47.33)  RECOMMENDATIONS: 1. Clinical correlation of these findings is necessary. The decision to treat obstructive sleep apnea (OSA) is usually based on the presence of apnea symptoms or the presence of associated medical conditions such as Hypertension, Congestive Heart Failure, Atrial Fibrillation or Obesity. The most common symptoms of OSA are snoring, gasping for breath while sleeping, daytime sleepiness and fatigue.  2. Initiating apnea therapy is recommended given the presence of symptoms and/or associated conditions. Recommend proceeding with one of the following:   a. Auto-CPAP therapy with a pressure range of 5-20cm H2O.   b. An  oral appliance (OA) that can be obtained from certain dentists with expertise in sleep medicine. These are primarily of use in non-obese patients with mild and moderate disease.   c. An ENT consultation which may be useful to look for specific causes of obstruction and possible treatment options.   d. If patient is intolerant to PAP therapy, consider referral to ENT for evaluation for hypoglossal nerve stimulator.  3. Close follow-up is necessary to ensure success with CPAP or oral appliance therapy for maximum benefit .  4. A follow-up oximetry study on CPAP is recommended to assess the adequacy of therapy and determine the need for supplemental oxygen or the potential need for Bi-level therapy. An arterial blood gas to determine the adequacy of baseline ventilation and oxygenation should also be considered.  5. Healthy sleep recommendations include: adequate nightly sleep (normal 7-9 hrs/night), avoidance of caffeine after noon and alcohol near bedtime, and maintaining a sleep environment that is cool, dark and quiet.  6. Weight loss for overweight patients is recommended. Even modest amounts of weight loss can significantly improve the severity of sleep apnea.  7. Snoring recommendations include: weight loss where appropriate, side sleeping, and avoidance of alcohol before bed.  8. Operation of motor vehicle should be avoided when sleepy.  Signature: Electronically Signed: 11/09/21 Fransico Him, MD; [facility]; [place], American Board of Sleep Medicine

## 2021-11-11 ENCOUNTER — Other Ambulatory Visit: Payer: Self-pay

## 2021-11-11 MED ORDER — ALBUTEROL SULFATE HFA 108 (90 BASE) MCG/ACT IN AERS
INHALATION_SPRAY | RESPIRATORY_TRACT | 1 refills | Status: DC
Start: 2021-11-11 — End: 2022-02-07
  Filled 2021-11-11: qty 18, 25d supply, fill #0
  Filled 2022-01-13: qty 18, 25d supply, fill #1

## 2021-11-13 DIAGNOSIS — C50912 Malignant neoplasm of unspecified site of left female breast: Secondary | ICD-10-CM | POA: Diagnosis not present

## 2021-11-14 ENCOUNTER — Other Ambulatory Visit: Payer: Self-pay

## 2021-11-14 ENCOUNTER — Ambulatory Visit
Admission: RE | Admit: 2021-11-14 | Discharge: 2021-11-14 | Disposition: A | Discharge status: Home / Self Care | Payer: 59 | Source: Ambulatory Visit | Attending: General Surgery | Admitting: General Surgery

## 2021-11-14 DIAGNOSIS — Z1231 Encounter for screening mammogram for malignant neoplasm of breast: Secondary | ICD-10-CM | POA: Insufficient documentation

## 2021-11-19 DIAGNOSIS — Z853 Personal history of malignant neoplasm of breast: Secondary | ICD-10-CM | POA: Diagnosis not present

## 2021-11-20 DIAGNOSIS — H524 Presbyopia: Secondary | ICD-10-CM | POA: Diagnosis not present

## 2021-11-21 ENCOUNTER — Other Ambulatory Visit: Payer: Self-pay

## 2021-11-26 ENCOUNTER — Ambulatory Visit: Payer: 59

## 2021-11-26 DIAGNOSIS — I5022 Chronic systolic (congestive) heart failure: Secondary | ICD-10-CM

## 2021-11-26 DIAGNOSIS — G4733 Obstructive sleep apnea (adult) (pediatric): Secondary | ICD-10-CM

## 2021-12-04 ENCOUNTER — Telehealth: Payer: Self-pay | Admitting: *Deleted

## 2021-12-04 NOTE — Telephone Encounter (Signed)
-----   Message from Cleon Gustin, [place] sent at 11/13/2021 10:49 AM EST -----  ----- Message ----- From: Sueanne Margarita, MD Sent: 11/09/2021   9:30 AM EST To: Freada Bergeron, CMA  Please let patient know that they have sleep apnea and recommend treating with CPAP.  Please order an auto CPAP from 4-15cm H2O with heated humidity and mask of choice.  Order overnight pulse ox on CPAP.  Followup with me in 6 weeks.

## 2021-12-04 NOTE — Telephone Encounter (Signed)
Patient notified of watchpat results and recommendations. She agrees to proceed with CPAP therapy. She also informs me that she has been on CPAP in the past about 7 years ago, and still has her old machine. She would like to try to have it set to the recommended pressures and use it until she can get her new machine. She will go home and try to " find it." We will attempt to get it set via [employer]. New order for APAP with ONO will be sent to [employer]. Patient has [employer].

## 2021-12-09 ENCOUNTER — Other Ambulatory Visit: Payer: Self-pay

## 2021-12-09 DIAGNOSIS — I509 Heart failure, unspecified: Secondary | ICD-10-CM | POA: Diagnosis not present

## 2021-12-09 DIAGNOSIS — R7302 Impaired glucose tolerance (oral): Secondary | ICD-10-CM | POA: Diagnosis not present

## 2021-12-09 DIAGNOSIS — E559 Vitamin D deficiency, unspecified: Secondary | ICD-10-CM | POA: Diagnosis not present

## 2021-12-09 MED ORDER — OZEMPIC (2 MG/DOSE) 8 MG/3ML ~~LOC~~ SOPN
2.0000 mg | PEN_INJECTOR | SUBCUTANEOUS | 5 refills | Status: DC
  Filled 2021-12-09: qty 3, 28d supply, fill #0
  Filled 2022-01-13: qty 3, 28d supply, fill #1
  Filled 2022-02-07: qty 3, 28d supply, fill #2

## 2022-01-03 ENCOUNTER — Other Ambulatory Visit: Payer: Self-pay

## 2022-01-03 MED ORDER — AMOXICILLIN-POT CLAVULANATE 875-125 MG PO TABS
1.0000 | ORAL_TABLET | Freq: Two times a day (BID) | ORAL | 0 refills | Status: DC
  Filled 2022-01-03: qty 14, 7d supply, fill #0

## 2022-01-03 MED ORDER — DOXYCYCLINE HYCLATE 100 MG PO CAPS
100.0000 mg | ORAL_CAPSULE | Freq: Two times a day (BID) | ORAL | 0 refills | Status: DC
  Filled 2022-01-03: qty 14, 7d supply, fill #0

## 2022-01-03 MED ORDER — CIPROFLOXACIN HCL 500 MG PO TABS
500.0000 mg | ORAL_TABLET | Freq: Two times a day (BID) | ORAL | 0 refills | Status: DC
  Filled 2022-01-03: qty 14, 7d supply, fill #0

## 2022-01-13 ENCOUNTER — Other Ambulatory Visit: Payer: Self-pay

## 2022-02-07 ENCOUNTER — Other Ambulatory Visit: Payer: Self-pay

## 2022-02-07 MED ORDER — ALBUTEROL SULFATE HFA 108 (90 BASE) MCG/ACT IN AERS
INHALATION_SPRAY | RESPIRATORY_TRACT | 1 refills | Status: DC
  Filled 2022-02-07: qty 18, 30d supply, fill #0
  Filled 2022-04-15: qty 18, 25d supply, fill #1

## 2022-02-11 ENCOUNTER — Other Ambulatory Visit: Payer: Self-pay

## 2022-02-24 ENCOUNTER — Other Ambulatory Visit: Payer: Self-pay

## 2022-02-25 ENCOUNTER — Ambulatory Visit
Admission: RE | Admit: 2022-02-25 | Discharge: 2022-02-25 | Disposition: A | Discharge status: Home / Self Care | Payer: PRIVATE HEALTH INSURANCE | Source: Ambulatory Visit | Attending: Physician Assistant | Admitting: Physician Assistant

## 2022-02-25 ENCOUNTER — Other Ambulatory Visit: Payer: Self-pay | Admitting: Physician Assistant

## 2022-02-25 ENCOUNTER — Ambulatory Visit
Admission: RE | Admit: 2022-02-25 | Payer: No Typology Code available for payment source | Source: Ambulatory Visit | Admitting: *Deleted

## 2022-02-25 DIAGNOSIS — M25561 Pain in right knee: Secondary | ICD-10-CM | POA: Insufficient documentation

## 2022-03-03 ENCOUNTER — Other Ambulatory Visit: Payer: Self-pay

## 2022-03-10 ENCOUNTER — Other Ambulatory Visit: Payer: Self-pay

## 2022-03-10 DIAGNOSIS — I509 Heart failure, unspecified: Secondary | ICD-10-CM | POA: Diagnosis not present

## 2022-03-10 DIAGNOSIS — R7302 Impaired glucose tolerance (oral): Secondary | ICD-10-CM | POA: Diagnosis not present

## 2022-03-10 DIAGNOSIS — E668 Other obesity: Secondary | ICD-10-CM | POA: Diagnosis not present

## 2022-03-10 MED ORDER — WEGOVY 2.4 MG/0.75ML ~~LOC~~ SOAJ
SUBCUTANEOUS | 3 refills | Status: DC
  Filled 2022-03-10 – 2022-03-24 (×3): qty 3, 28d supply, fill #0
  Filled 2022-03-31 – 2022-04-15 (×2): qty 3, 28d supply, fill #1
  Filled 2022-05-19: qty 3, 28d supply, fill #2
  Filled 2022-06-16: qty 3, 28d supply, fill #3

## 2022-03-11 ENCOUNTER — Other Ambulatory Visit: Payer: Self-pay

## 2022-03-17 ENCOUNTER — Other Ambulatory Visit: Payer: Self-pay

## 2022-03-19 ENCOUNTER — Ambulatory Visit: Payer: PRIVATE HEALTH INSURANCE | Attending: Orthopaedic Surgery

## 2022-03-19 DIAGNOSIS — R262 Difficulty in walking, not elsewhere classified: Secondary | ICD-10-CM | POA: Insufficient documentation

## 2022-03-19 DIAGNOSIS — M25561 Pain in right knee: Secondary | ICD-10-CM | POA: Diagnosis present

## 2022-03-19 NOTE — Patient Instructions (Signed)
Access Code: [email or web address] ?URL: [email or web address] ?Date: 03/19/2022 ?Prepared by: Janna Arch ? ?Exercises ?- Leg Extension  - 1 x daily - 7 x weekly - 2 sets - 10 reps - 3 hold ?- Seated Hip Adduction Isometrics with Ball  - 1 x daily - 7 x weekly - 2 sets - 10 reps - 5 hold ?- Seated Heel Toe Raises  - 1 x daily - 7 x weekly - 2 sets - 10 reps - 5 hold ? ?

## 2022-03-19 NOTE — Therapy (Signed)
[facility] ?[place], [place], [address] ?Phone: [phone]   Fax:  [phone] ? ?Physical Therapy Evaluation ? ?Patient Details  ?Name: Mandy Roberts ?MRN: [id number] ?Date of Birth: 04-29-1961 ?Referring Provider (PT): Melrose Nakayama ? ? ?Encounter Date: 03/19/2022 ? ? PT End of Session - 03/19/22 1215   ? ? Visit Number 1   ? Number of Visits 9   ? Date for PT Re-Evaluation 05/14/22   ? Authorization Type workers comp: 1 eval and 8 treats allowed.   ? PT Start Time 1100   ? PT Stop Time 1601   ? PT Time Calculation (min) 38 min   ? Activity Tolerance Patient limited by pain   ? Behavior During Therapy [facility] for tasks assessed/performed   ? ?  ?  ? ?  ? ? ?Past Medical History:  ?Diagnosis Date  ? Atrial fibrillation ([place])   ? Breast cancer [facility]) 1990/1997  ?  left mastectomy ,had recurrence in left suprclavic nodes several yrs ago-treated with radiation and chemo  ? Colon polyp 2008  ? Complication of anesthesia   ? Difficult intubation   ? expectorated blood 3-4 days after surgery in 2011  ? Difficult intubation   ? hypoxia with endoscopy in 2016  ? History of chemotherapy   ? 5-FU/AC in 1990 under the care of Dr. Oliva Bustard. pt also tx with tamoxifen and arimidex  ? History of radiation therapy   ? radiation treat at [facility]  ? Hypertension 2008  ? Personal history of chemotherapy 1990  ? BREAST CA  ? Personal history of malignant neoplasm of breast 1990  ?  left mastectomy   ? Personal history of radiation therapy 1997  ? BREAST CA  ? Sinusitis   ? Sleep apnea   ? history of, went away after gastric surgery  ? ? ?Past Surgical History:  ?Procedure Laterality Date  ? ABDOMINAL HYSTERECTOMY  1996  ? COLONOSCOPY  2008, 2015  ? Dr. Jamal Collin  ? COLONOSCOPY WITH PROPOFOL N/A 10/12/2019  ? Procedure: COLONOSCOPY WITH PROPOFOL;  Surgeon: Robert Bellow, MD;  Location: [facility] ENDOSCOPY;  Service: Endoscopy;  Laterality: N/A;  DR BYRNETT WILL UPDATE H&P ON  PROCEDURE DATE  ? DILATION AND CURETTAGE OF UTERUS    ? KNEE ARTHROPLASTY Left 09/07/2017  ? Procedure: COMPUTER ASSISTED TOTAL KNEE ARTHROPLASTY;  Surgeon: Dereck Leep, MD;  Location: [facility] ORS;  Service: Orthopedics;  Laterality: Left;  ? KNEE ARTHROSCOPY Bilateral   ? KNEE SURGERY Left 2009, 2010, 2012  ? LAPAROSCOPIC GASTRIC RESTRICTIVE DUODENAL PROCEDURE (DUODENAL SWITCH) N/A 04/17/2015  ? Procedure: LAPAROSCOPIC GASTRIC RESTRICTIVE DUODENAL PROCEDURE, single anastamosis ;  Surgeon: Bonner Puna, MD;  Location: [facility] ORS;  Service: General;  Laterality: N/A;  ? MASTECTOMY Left 1990  ? BREAST CA  ? TONSILLECTOMY    ? UPPER GI ENDOSCOPY N/A 04/17/2015  ? Procedure: UPPER GI ENDOSCOPY;  Surgeon: Bonner Puna, MD;  Location: [facility] ORS;  Service: General;  Laterality: N/A;  ? ? ?There were no vitals filed for this visit. ? ? ? Subjective Assessment - 03/19/22 1111   ? ? Subjective Patient presents to PT for knee pain.   ? Pertinent History Patient presents for diagnosis of patellofemoral arthritis of R knee. PMH includes breast cancer (hx of chemo and radiation therapy) HTN, OSA, a fib, type II DM   ? Limitations Walking;House hold activities   ? How long can you walk comfortably? painful  with walking   ? Diagnostic tests X ray on 02/25/22; No evidence of fracture, dislocation, or joint effusion. Mild  narrowing of patellofemoral space is noted with minimal patellar  spurring. Soft tissues are unremarkable   ? Patient Stated Goals to have decreased knee pain.   ? Currently in Pain? No/denies   ? ?  ?  ? ?  ? ? ? ? [facility] PT Assessment - 03/19/22 0001   ? ?  ? Assessment  ? Medical Diagnosis R knee pain   ? Referring Provider (PT) Melrose Nakayama   ? Onset Date/Surgical Date --   about a month-2 months  ? Hand Dominance Right   ? Prior Therapy yes for knee surgery on other knee   ?  ? Precautions  ? Precautions None   ?  ? Restrictions  ? Weight Bearing Restrictions No   ?  ? Balance Screen  ? Has the patient fallen in the  past 6 months Yes   ? How many times? 1   ? Has the patient had a decrease in activity level because of a fear of falling?  Yes   ? Is the patient reluctant to leave their home because of a fear of falling?  No   ?  ? Home Environment  ? Living Environment Private residence   ? Living Arrangements Other relatives   ? Home Access Stairs to enter   ? Entrance Stairs-Number of Steps 7 front, 4 back enstrance   ? Entrance Stairs-Rails Right;Left   ? Home Layout One level   ? Home Equipment Walker - standard;Cane - single point;Wheelchair - manual;Other (comment)   ?  ? Prior Function  ? Level of Independence Independent   ? Vocation Full time employment   ? Vocation Requirements sitting, computer, manage ED, standing, walking   ? Leisure play bingo, family, travel   ?  ? Cognition  ? Overall Cognitive Status Within Functional Limits for tasks assessed   ?  ? Observation/Other Assessments  ? Focus on Therapeutic Outcomes (FOTO)  61%   ? ?  ?  ? ?  ? ? ? ? ?SUBJECTIVE ?Chief complaint:   ?History:Patient presents for patellofemoral arthritis of R knee. PMH includes breast cancer (hx of chemo and radiation therapy) HTN, OSA, a fib, type II DM. Patient is an emergency department coordinator. Patient reports pain began the day after she slipped about a month or two ago.  ? ?Referring KW:[id number] arthritis of R knee ?Referring Provider: Melrose Nakayama MD  ?Follow-up appt scheduled with provider: No  ?Pain Location:medial aspect of R knee  ?Pain quality: stabbing ?Pain Intensity: Present:0 /10, Best: 0/10, Worst: 8/10 ?24 hour pain behavior: random ?Aggravating factors:high heels make it worse ?Easing factors: wearing flats  ?Radiating symptoms: No  ?Numbness/Tingling: No ?Popping, clicking, locking, or swelling at time or injury or currently: Yes ?Prior history of knee injury, surgery, and/or pain: No ?Prior history of back, hip, or ankle injury, surgery, and/or pain: No ?Falls in the last 6 months: Yes  ?Dominant  hand: right ?Imaging: Yes  ?How long can you sit:n/a ?How long can you stand:painful in knee when heels elevated ?How long can you walk: painful but not limiting if in flat shoes ? ?Typical footwear:heels ?Goals for therapy: to reduce pain and return to heels  ? ? ?OBJECTIVE ? ?MUSCULOSKELETAL: ?Tremor: Absent ?Bulk: Normal ?Tone: Normal, no spasticity, rigidity, or clonus appreciated ?Trophic Changes: No ecchymosis, erythema, or edema noted around knee. No gross knee deformity  noted ? ?Posture ?No gross abnormalities noted in standing or seated posture ? ?Lumbar/Hip ?AROM: WFL and painless with overpressure in all planes ?L hip painful from compensation  ? ?Gait ?Slight antalgic gait patterning with decreased weightbearing on RLE  ? ?Palpation ?Graded on 0-4 scale (0 = no pain, 1 = pain, 2 = pain with wincing/grimacing/flinching, 3 = pain with withdrawal, 4 = unwilling to allow palpation), NT= not tested ?Location Medial  Lateral   Structure Comments  ?MCL 1 0 0   ?LCL 0 0 0   ?Joint Line 3 0 0 Medial meniscus very painful   ?Patella 0 0 0   ?Patellar Tendon 0 0 0   ?Quadriceps Tendon 0 0 0   ?Quadriceps 0 0 0   ?Hamstring Tendons 0 0    ?Hamstrings 0 0    ? ? ?Strength ?R/L ?4+/5 Hip flexion ?5/5 Hip extension  ?5/5 Hip abduction ?4/5 Hip adduction ?4*/5 Knee extension ?5/5 Knee flexion ?5/5 Ankle Dorsiflexion ?5/5 Ankle Plantarflexion ?5/5 Ankle Inversion ?5/5 Ankle Eversion ?*indicates pain ? ?AROM ?Knee ?R/L ?Flexion: limited by pain for full range/WFL  ?Extension: 0* painful when transitioning from flexed to extended/0 ?*indicates pain ? ?Passive Accessory Motion ?Superior Tibiofibular Joint: tender medial ?Knee:pain in medial meniscus ?Patella: WFL ?Ankle:WFL ? ? ? ? ?NEUROLOGICAL: ? ?Mental Status ?Patient is oriented to person, place and time.  ?Recent memory is intact.  ?Remote memory is intact.  ?Attention span and concentration are intact.  ?Expressive speech is intact.  ?Patient's fund of knowledge is  within normal limits for educational level. ? ?Sensation ?Grossly intact to light touch bilateral LEs as determined by testing dermatomes L2-S2 ?Proprioception and hot/cold testing deferred on this date ?

## 2022-03-24 ENCOUNTER — Other Ambulatory Visit: Payer: Self-pay

## 2022-03-24 ENCOUNTER — Ambulatory Visit: Payer: No Typology Code available for payment source

## 2022-03-26 ENCOUNTER — Ambulatory Visit: Payer: PRIVATE HEALTH INSURANCE | Attending: Internal Medicine

## 2022-03-26 DIAGNOSIS — M6281 Muscle weakness (generalized): Secondary | ICD-10-CM | POA: Diagnosis present

## 2022-03-26 DIAGNOSIS — R262 Difficulty in walking, not elsewhere classified: Secondary | ICD-10-CM | POA: Diagnosis present

## 2022-03-26 DIAGNOSIS — M25561 Pain in right knee: Secondary | ICD-10-CM

## 2022-03-26 NOTE — Therapy (Signed)
[facility] ?[place], [place], [address] ?Phone: [phone]   Fax:  [phone] ? ?Physical Therapy Treatment ? ?Patient Details  ?Name: Mandy Roberts ?MRN: [id number] ?Date of Birth: Nov 29, 1960 ?Referring Provider (PT): Melrose Nakayama ? ? ?Encounter Date: 03/26/2022 ? ? PT End of Session - 03/26/22 1702   ? ? Visit Number 2   ? Number of Visits 9   ? Date for PT Re-Evaluation 05/14/22   ? Authorization Type workers comp: 1 eval and 8 treats allowed.   ? PT Start Time 1017   ? PT Stop Time 1058   ? PT Time Calculation (min) 41 min   ? Activity Tolerance Patient limited by pain   ? Behavior During Therapy [facility] for tasks assessed/performed   ? ?  ?  ? ?  ? ? ?Past Medical History:  ?Diagnosis Date  ? Atrial fibrillation ([place])   ? Breast cancer [facility]) 1990/1997  ?  left mastectomy ,had recurrence in left suprclavic nodes several yrs ago-treated with radiation and chemo  ? Colon polyp 2008  ? Complication of anesthesia   ? Difficult intubation   ? expectorated blood 3-4 days after surgery in 2011  ? Difficult intubation   ? hypoxia with endoscopy in 2016  ? History of chemotherapy   ? 5-FU/AC in 1990 under the care of Dr. Oliva Bustard. pt also tx with tamoxifen and arimidex  ? History of radiation therapy   ? radiation treat at [facility]  ? Hypertension 2008  ? Personal history of chemotherapy 1990  ? BREAST CA  ? Personal history of malignant neoplasm of breast 1990  ?  left mastectomy   ? Personal history of radiation therapy 1997  ? BREAST CA  ? Sinusitis   ? Sleep apnea   ? history of, went away after gastric surgery  ? ? ?Past Surgical History:  ?Procedure Laterality Date  ? ABDOMINAL HYSTERECTOMY  1996  ? COLONOSCOPY  2008, 2015  ? Dr. Jamal Collin  ? COLONOSCOPY WITH PROPOFOL N/A 10/12/2019  ? Procedure: COLONOSCOPY WITH PROPOFOL;  Surgeon: Robert Bellow, MD;  Location: [facility] ENDOSCOPY;  Service: Endoscopy;  Laterality: N/A;  DR BYRNETT WILL UPDATE H&P ON  PROCEDURE DATE  ? DILATION AND CURETTAGE OF UTERUS    ? KNEE ARTHROPLASTY Left 09/07/2017  ? Procedure: COMPUTER ASSISTED TOTAL KNEE ARTHROPLASTY;  Surgeon: Dereck Leep, MD;  Location: [facility] ORS;  Service: Orthopedics;  Laterality: Left;  ? KNEE ARTHROSCOPY Bilateral   ? KNEE SURGERY Left 2009, 2010, 2012  ? LAPAROSCOPIC GASTRIC RESTRICTIVE DUODENAL PROCEDURE (DUODENAL SWITCH) N/A 04/17/2015  ? Procedure: LAPAROSCOPIC GASTRIC RESTRICTIVE DUODENAL PROCEDURE, single anastamosis ;  Surgeon: Bonner Puna, MD;  Location: [facility] ORS;  Service: General;  Laterality: N/A;  ? MASTECTOMY Left 1990  ? BREAST CA  ? TONSILLECTOMY    ? UPPER GI ENDOSCOPY N/A 04/17/2015  ? Procedure: UPPER GI ENDOSCOPY;  Surgeon: Bonner Puna, MD;  Location: [facility] ORS;  Service: General;  Laterality: N/A;  ? ? ?There were no vitals filed for this visit. ? ? Subjective Assessment - 03/26/22 1018   ? ? Subjective Pt reports no pain but some soreness. Pt has not experienced knee pinch sensation today.   ? Pertinent History Patient presents for diagnosis of patellofemoral arthritis of R knee. PMH includes breast cancer (hx of chemo and radiation therapy) HTN, OSA, a fib, type II DM   ? Limitations Walking;House hold activities   ?  How long can you walk comfortably? painful with walking   ? Diagnostic tests X ray on 02/25/22; No evidence of fracture, dislocation, or joint effusion. Mild  narrowing of patellofemoral space is noted with minimal patellar  spurring. Soft tissues are unremarkable   ? Patient Stated Goals to have decreased knee pain.   ? Currently in Pain? No/denies   ? Pain Onset 1 to 4 weeks ago   ? ?  ?  ? ?  ? ? ? ? ?INTERVENTIONS -  ? ?LAQ 1x15 BLE; Pt rates as medium  ? Progressed to 2 lb weights 1x10 B; 1x4 RLE (pain limited), 1x10 LLE  ? ?Seated pball adductor squeeze 10 reps x 2 sets with 3 sec holds. Pt notes muscles feel as if they are trembling  ? ?Seated heel-toe raises 2x10  ? ?Seated RTB hip abduction/ER 2x20; pt rates as  medium ? ?On plinth, bolster supporting knees: ?Hamstring flexibility lacking approx 5 deg B.  ? ?Hamstring stretch to tolerance - 30 sec with ankle pumps for nerve glide BLEs. Felt most on LLE  ? ?SLR 2x6 RLE, LLE 2x10 ? ?Sidelying hip abduction 10x B ?Clamshell 10x B. Pt reports pain in L hip, discontinued. ? ?LTRs 10x pain limited ? ?Attempted figure 4 and IT band stretch on LLE. Pt reports tightness felt in L hip, however, unable to continue stretches beyond 15-20 sec as they are currently pain-limited. ? ?Heat donned to L hip as pt performs the following-  ? ?Supine adductor squeezes with pball 10x with 3 sec holds ? ?Pt reports heat feels good, improving L hip pain. No adverse reaction to treatment.  ? ?Pt educated throughout session about proper posture and technique with exercises. Improved exercise technique, movement at target joints, use of target muscles after min to mod verbal, visual, tactile cues. ? ? ? ?Pt notes increased swelling L inferior patella. Thinks it is due to compensation and increased time on LLE ? ? ? PT Education - 03/26/22 1701   ? ? Education provided Yes   ? Education Details exercise technique   ? Person(s) Educated Patient   ? Methods Explanation;Demonstration;Tactile cues;Verbal cues   ? Comprehension Verbalized understanding;Returned demonstration;Tactile cues required;Need further instruction   ? ?  ?  ? ?  ? ? ? PT Short Term Goals - 03/19/22 1218   ? ?  ? PT SHORT TERM GOAL #1  ? Title Pt will be independent with HEP in order to decrease knee pain and increase strength in order to improve pain-free function at home and work.   ? Baseline 4/26: HEP given   ? Time 4   ? Period Weeks   ? Status New   ? Target Date 04/16/22   ? ?  ?  ? ?  ? ? ? ? PT Long Term Goals - 03/19/22 1219   ? ?  ? PT LONG TERM GOAL #1  ? Title Patient will increase FOTO score to equal to or greater than  71%   to demonstrate statistically significant improvement in mobility and quality of life.   ?  Baseline 4/26: 61%   ? Time 8   ? Period Weeks   ? Status New   ? Target Date 05/14/22   ?  ? PT LONG TERM GOAL #2  ? Title Patient will report a worst pain of 3/10 on VAS in R knee to improve tolerance with ADLs and reduced symptoms with activities.   ? Baseline 4/26: 8/10   ?  Time 8   ? Period Weeks   ? Status New   ? Target Date 05/14/22   ?  ? PT LONG TERM GOAL #3  ? Title Patient will be able to squat and return to wearing heeled shoes without pain to return to PLOF.   ? Baseline 4/26: unable to squat or wear heels   ? Time 8   ? Period Weeks   ? Status New   ? Target Date 05/14/22   ?  ? PT LONG TERM GOAL #4  ? Title Patient will ascend/descend 4 stairs without rail assist independently without loss of balance to improve ability to get in/out of home.   ? Baseline 4/26: step to patterning with BUE support   ? Time 8   ? Period Weeks   ? Status New   ? Target Date 05/14/22   ? ?  ?  ? ?  ? ? ? ? ? ? ? ? Plan - 03/26/22 1703   ? ? Clinical Impression Statement Pt tolerates interventions fair this session. LE stretching and hip abduction strengthening limited due primarily to L hip pain. Pt able to progress to weighted LAQ (2 lb), but second set discontinued due to increase in R knee pain. Will continue to monitor L hip as pt reports this started developing recently.  Pt to bring this up with her physician.  Hip pain did respond well to use of heat to area while pt performed supine adduction. The pt will benefit from further skilled PT services to improve pain, strength, and mobility.   ? Personal Factors and Comorbidities Age;Comorbidity 3+;Past/Current Experience;Profession;Time since onset of injury/illness/exacerbation   ? Comorbidities breast cancer (hx of chemo and radiation therapy) HTN, OSA, a fib, type II DM   ? Examination-Activity Limitations [place];Caring for Others;Carry;Locomotion Level;Lift;Squat;Stairs;Stand;Toileting;Transfers   ? Examination-Participation Restrictions Cleaning;Community  Activity;Occupation;Laundry;Shop;Volunteer;[place] Work   ? Stability/Clinical Decision Making Stable/Uncomplicated   ? Rehab Potential Good   ? PT Frequency 1x / week   ? PT Duration 8 weeks   ? PT Treatment/Interventions [employer]

## 2022-03-31 ENCOUNTER — Other Ambulatory Visit: Payer: Self-pay

## 2022-04-01 ENCOUNTER — Ambulatory Visit: Payer: PRIVATE HEALTH INSURANCE

## 2022-04-01 DIAGNOSIS — M25561 Pain in right knee: Secondary | ICD-10-CM

## 2022-04-01 DIAGNOSIS — R262 Difficulty in walking, not elsewhere classified: Secondary | ICD-10-CM

## 2022-04-01 DIAGNOSIS — M6281 Muscle weakness (generalized): Secondary | ICD-10-CM

## 2022-04-01 NOTE — Therapy (Signed)
[facility] ?[place], [place], [address] ?Phone: [phone]   Fax:  [phone] ? ?Physical Therapy Treatment ? ?Patient Details  ?Name: Mandy Roberts ?MRN: [id number] ?Date of Birth: 1961/02/05 ?Referring Provider (PT): Melrose Nakayama ? ? ?Encounter Date: 04/01/2022 ? ? PT End of Session - 04/01/22 4818   ? ? Visit Number 3   ? Number of Visits 9   ? Date for PT Re-Evaluation 05/14/22   ? Authorization Type 2/8 treats allowed.   ? PT Start Time [phone]   ? PT Stop Time 0844   ? PT Time Calculation (min) 40 min   ? Activity Tolerance Patient limited by pain   ? Behavior During Therapy [facility] for tasks assessed/performed   ? ?  ?  ? ?  ? ? ?Past Medical History:  ?Diagnosis Date  ? Atrial fibrillation ([place])   ? Breast cancer [facility]) 1990/1997  ?  left mastectomy ,had recurrence in left suprclavic nodes several yrs ago-treated with radiation and chemo  ? Colon polyp 2008  ? Complication of anesthesia   ? Difficult intubation   ? expectorated blood 3-4 days after surgery in 2011  ? Difficult intubation   ? hypoxia with endoscopy in 2016  ? History of chemotherapy   ? 5-FU/AC in 1990 under the care of Dr. Oliva Bustard. pt also tx with tamoxifen and arimidex  ? History of radiation therapy   ? radiation treat at [facility]  ? Hypertension 2008  ? Personal history of chemotherapy 1990  ? BREAST CA  ? Personal history of malignant neoplasm of breast 1990  ?  left mastectomy   ? Personal history of radiation therapy 1997  ? BREAST CA  ? Sinusitis   ? Sleep apnea   ? history of, went away after gastric surgery  ? ? ?Past Surgical History:  ?Procedure Laterality Date  ? ABDOMINAL HYSTERECTOMY  1996  ? COLONOSCOPY  2008, 2015  ? Dr. Jamal Collin  ? COLONOSCOPY WITH PROPOFOL N/A 10/12/2019  ? Procedure: COLONOSCOPY WITH PROPOFOL;  Surgeon: Robert Bellow, MD;  Location: [facility] ENDOSCOPY;  Service: Endoscopy;  Laterality: N/A;  DR BYRNETT WILL UPDATE H&P ON PROCEDURE DATE  ?  DILATION AND CURETTAGE OF UTERUS    ? KNEE ARTHROPLASTY Left 09/07/2017  ? Procedure: COMPUTER ASSISTED TOTAL KNEE ARTHROPLASTY;  Surgeon: Dereck Leep, MD;  Location: [facility] ORS;  Service: Orthopedics;  Laterality: Left;  ? KNEE ARTHROSCOPY Bilateral   ? KNEE SURGERY Left 2009, 2010, 2012  ? LAPAROSCOPIC GASTRIC RESTRICTIVE DUODENAL PROCEDURE (DUODENAL SWITCH) N/A 04/17/2015  ? Procedure: LAPAROSCOPIC GASTRIC RESTRICTIVE DUODENAL PROCEDURE, single anastamosis ;  Surgeon: Bonner Puna, MD;  Location: [facility] ORS;  Service: General;  Laterality: N/A;  ? MASTECTOMY Left 1990  ? BREAST CA  ? TONSILLECTOMY    ? UPPER GI ENDOSCOPY N/A 04/17/2015  ? Procedure: UPPER GI ENDOSCOPY;  Surgeon: Bonner Puna, MD;  Location: [facility] ORS;  Service: General;  Laterality: N/A;  ? ? ?There were no vitals filed for this visit. ? ? Subjective Assessment - 04/01/22 0820   ? ? Subjective Patient reports she had a pinch sensation yesterday when walking.   ? Pertinent History Patient presents for diagnosis of patellofemoral arthritis of R knee. PMH includes breast cancer (hx of chemo and radiation therapy) HTN, OSA, a fib, type II DM   ? Limitations Walking;House hold activities   ? How long can you walk comfortably? painful with walking   ?  Diagnostic tests X ray on 02/25/22; No evidence of fracture, dislocation, or joint effusion. Mild  narrowing of patellofemoral space is noted with minimal patellar  spurring. Soft tissues are unremarkable   ? Patient Stated Goals to have decreased knee pain.   ? Currently in Pain? No/denies   ? ?  ?  ? ?  ? ? ? ? ?INTERVENTIONS -  ?Supine: ?Hamstring lengthening technique with leg on PT shoulder 60 seconds each LE ?Sciatic nerve glide 20x each LE with leg on PT shoulder ?Figure four stretch 60 seconds each LE ?Gentle tib/fib mobilizations for stiffness reduction ?Patella mobilizations x 2 minutes all directions ? ? ?Bridge with adduction ball squeeze 10x; x2 sets ?SLR 10x each LE; x2 sets each LE   ? ?Seated: ?LAQ 10x 3 second holds ?LAQ with adduction ball squeeze between ankles 15x  ?Abduction GTB single limb at a time 10x  ?Prostretch 20x 3 second holds ?RTB march with band across feet with df 12x each LE  ?Seated hamstring lengthening stretch 30 seconds each LE ?Heel toe raises 20x ? ? ?Pt educated throughout session about proper posture and technique with exercises. Improved exercise technique, movement at target joints, use of target muscles after min to mod verbal, visual, tactile cues. ? ? ?Patient is highly motivated throughout physical therapy session for progression of care. She does continue to have intermittent catching of meniscus limiting her daily life. Patient will benefit from imaging to address meniscal involvement. The pt will benefit from further skilled PT services to improve pain, strength, and mobility. ?  ? ? ? ? ? ? ? ? ? ? ? ? ? ? ? ? ? ? ? ? ? ? ? ? PT Education - 04/01/22 0821   ? ? Education provided Yes   ? Education Details exercise technique, body mechanics   ? Person(s) Educated Patient   ? Methods Explanation;Demonstration;Tactile cues;Verbal cues   ? Comprehension Verbalized understanding;Returned demonstration;Verbal cues required;Tactile cues required   ? ?  ?  ? ?  ? ? ? PT Short Term Goals - 03/19/22 1218   ? ?  ? PT SHORT TERM GOAL #1  ? Title Pt will be independent with HEP in order to decrease knee pain and increase strength in order to improve pain-free function at home and work.   ? Baseline 4/26: HEP given   ? Time 4   ? Period Weeks   ? Status New   ? Target Date 04/16/22   ? ?  ?  ? ?  ? ? ? ? PT Long Term Goals - 03/19/22 1219   ? ?  ? PT LONG TERM GOAL #1  ? Title Patient will increase FOTO score to equal to or greater than  71%   to demonstrate statistically significant improvement in mobility and quality of life.   ? Baseline 4/26: 61%   ? Time 8   ? Period Weeks   ? Status New   ? Target Date 05/14/22   ?  ? PT LONG TERM GOAL #2  ? Title Patient will report  a worst pain of 3/10 on VAS in R knee to improve tolerance with ADLs and reduced symptoms with activities.   ? Baseline 4/26: 8/10   ? Time 8   ? Period Weeks   ? Status New   ? Target Date 05/14/22   ?  ? PT LONG TERM GOAL #3  ? Title Patient will be able to squat and return to wearing  heeled shoes without pain to return to PLOF.   ? Baseline 4/26: unable to squat or wear heels   ? Time 8   ? Period Weeks   ? Status New   ? Target Date 05/14/22   ?  ? PT LONG TERM GOAL #4  ? Title Patient will ascend/descend 4 stairs without rail assist independently without loss of balance to improve ability to get in/out of home.   ? Baseline 4/26: step to patterning with BUE support   ? Time 8   ? Period Weeks   ? Status New   ? Target Date 05/14/22   ? ?  ?  ? ?  ? ? ? ? ? ? ? ? Plan - 04/01/22 0845   ? ? Clinical Impression Statement Patient is highly motivated throughout physical therapy session for progression of care. She does continue to have intermittent catching of meniscus limiting her daily life. Patient will benefit from imaging to address meniscal involvement. The pt will benefit from further skilled PT services to improve pain, strength, and mobility.   ? Personal Factors and Comorbidities Age;Comorbidity 3+;Past/Current Experience;Profession;Time since onset of injury/illness/exacerbation   ? Comorbidities breast cancer (hx of chemo and radiation therapy) HTN, OSA, a fib, type II DM   ? Examination-Activity Limitations [place];Caring for Others;Carry;Locomotion Level;Lift;Squat;Stairs;Stand;Toileting;Transfers   ? Examination-Participation Restrictions Cleaning;Community Activity;Occupation;Laundry;Shop;Volunteer;[place] Work   ? Stability/Clinical Decision Making Stable/Uncomplicated   ? Rehab Potential Good   ? PT Frequency 1x / week   ? PT Duration 8 weeks   ? PT Treatment/Interventions Electrical Stimulation;Cryotherapy;Moist Heat;Gait training;Patient/family education;Neuromuscular re-education;Therapeutic  exercise;Balance training;Manual techniques;Scar mobilization;Ultrasound;Traction;DME Instruction;Stair training;Therapeutic activities;Compression bandaging;Passive range of motion;Dry needling;Energy conservation;Splinting;[place]

## 2022-04-03 ENCOUNTER — Encounter: Payer: 59 | Admitting: Physical Therapy

## 2022-04-07 ENCOUNTER — Other Ambulatory Visit: Payer: Self-pay | Admitting: Orthopaedic Surgery

## 2022-04-07 DIAGNOSIS — M25561 Pain in right knee: Secondary | ICD-10-CM

## 2022-04-08 ENCOUNTER — Ambulatory Visit: Payer: PRIVATE HEALTH INSURANCE

## 2022-04-11 ENCOUNTER — Ambulatory Visit
Admission: RE | Admit: 2022-04-11 | Discharge: 2022-04-11 | Disposition: A | Discharge status: Home / Self Care | Payer: PRIVATE HEALTH INSURANCE | Source: Ambulatory Visit | Attending: Orthopaedic Surgery | Admitting: Orthopaedic Surgery

## 2022-04-11 DIAGNOSIS — M25561 Pain in right knee: Secondary | ICD-10-CM | POA: Insufficient documentation

## 2022-04-16 ENCOUNTER — Other Ambulatory Visit: Payer: Self-pay

## 2022-04-17 ENCOUNTER — Encounter: Payer: 59 | Admitting: Physical Therapy

## 2022-04-18 ENCOUNTER — Other Ambulatory Visit: Payer: Self-pay

## 2022-04-24 ENCOUNTER — Encounter: Payer: 59 | Admitting: Physical Therapy

## 2022-04-29 ENCOUNTER — Encounter: Payer: 59 | Admitting: Physical Therapy

## 2022-05-01 ENCOUNTER — Encounter: Payer: 59 | Admitting: Physical Therapy

## 2022-05-05 ENCOUNTER — Encounter: Payer: 59 | Admitting: Physical Therapy

## 2022-05-07 ENCOUNTER — Encounter: Payer: 59 | Admitting: Physical Therapy

## 2022-05-14 ENCOUNTER — Encounter: Payer: 59 | Admitting: Physical Therapy

## 2022-05-19 ENCOUNTER — Encounter: Payer: 59 | Admitting: Physical Therapy

## 2022-05-19 ENCOUNTER — Other Ambulatory Visit: Payer: Self-pay

## 2022-05-21 ENCOUNTER — Other Ambulatory Visit: Payer: Self-pay | Admitting: Orthopaedic Surgery

## 2022-05-21 ENCOUNTER — Encounter: Payer: 59 | Admitting: Physical Therapy

## 2022-05-26 ENCOUNTER — Encounter: Payer: 59 | Admitting: Physical Therapy

## 2022-06-02 ENCOUNTER — Encounter: Payer: 59 | Admitting: Physical Therapy

## 2022-06-06 NOTE — Progress Notes (Signed)
Sent message, via epic in basket, requesting order in epic from surgeon     06/06/22 1527  Preop Orders  Has preop orders? No  Name of staff/physician contacted for orders(Indicate phone or IB message) Marlin Canary

## 2022-06-11 ENCOUNTER — Encounter: Payer: 59 | Admitting: Physical Therapy

## 2022-06-12 NOTE — Progress Notes (Addendum)
COVID Vaccine Completed: yes x1  Date of COVID positive in last 90 days: no  PCP - Lamonte Sakai, MD Cardiologist - Janalee Dane, MD  Chest x-ray - n/a EKG - 06/13/22 Epic/chart Stress Test - 06/07/13 Epic ECHO - 10/04/21 Epic Cardiac Cath - n/a Pacemaker/ICD device last checked: n/a Spinal Cord Stimulator: n/a  Bowel Prep - no  Sleep Study - yes, positive CPAP - no  Fasting Blood Sugar - n/a Checks Blood Sugar _____ times a day  Blood Thinner Instructions: n/a Aspirin Instructions: Last Dose:  Activity level: Can go up a flight of stairs and perform activities of daily living without stopping and without symptoms of chest pain or shortness of breath.  Anesthesia review: HTN, a fib, OSA, thrombocytopenia, difficult intubation   Patient denies shortness of breath, fever, cough and chest pain at PAT appointment  Patient verbalized understanding of instructions that were given to them at the PAT appointment. Patient was also instructed that they will need to review over the PAT instructions again at home before surgery.

## 2022-06-12 NOTE — Patient Instructions (Addendum)
DUE TO COVID-19 ONLY TWO VISITORS  (aged 61 and older)  ARE ALLOWED TO COME WITH YOU AND STAY IN THE WAITING ROOM ONLY DURING PRE OP AND PROCEDURE.   **NO VISITORS ARE ALLOWED IN THE SHORT STAY AREA OR RECOVERY ROOM!!**    Your procedure is scheduled on: 06/24/22   Report to [facility] Main Entrance    Report to admitting at 10:35 AM   Call this number if you have problems the morning of surgery [phone]   Do not eat food :After Midnight.   After Midnight you may have the following liquids until 9:50 AM DAY OF SURGERY  Water Black Coffee (sugar ok, NO MILK/CREAM OR CREAMERS)  Tea (sugar ok, NO MILK/CREAM OR CREAMERS) regular and decaf                             Plain Jell-O (NO RED)                                           Fruit ices (not with fruit pulp, NO RED)                                     Popsicles (NO RED)                                                                  Juice: apple, WHITE grape, WHITE cranberry Sports drinks like Gatorade (NO RED)              FOLLOW BOWEL PREP AND ANY ADDITIONAL PRE OP INSTRUCTIONS YOU RECEIVED FROM YOUR SURGEON'S OFFICE!!!     Oral Hygiene is also important to reduce your risk of infection.                                    Remember - BRUSH YOUR TEETH THE MORNING OF SURGERY WITH YOUR REGULAR TOOTHPASTE   Take these medicines the morning of surgery with A SIP OF WATER: Inhalers, Carvedilol.                               You may not have any metal on your body including hair pins, jewelry, and body piercing             Do not wear make-up, lotions, powders, perfumes, or deodorant  Do not wear nail polish including gel and S&S, artificial/acrylic nails, or any other type of covering on natural nails including finger and toenails. If you have artificial nails, gel coating, etc. that needs to be removed by a nail salon please have this removed prior to surgery or surgery may need to be canceled/ delayed if the surgeon/  anesthesia feels like they are unable to be safely monitored.   Do not shave  48 hours prior to surgery.    Do not bring valuables to the hospital. [facility] IS NOT  RESPONSIBLE   FOR VALUABLES.   Contacts, dentures or bridgework may not be worn into surgery.  DO NOT [place]. PHARMACY WILL DISPENSE MEDICATIONS LISTED ON YOUR MEDICATION LIST TO YOU DURING YOUR ADMISSION [place]!    Patients discharged on the day of surgery will not be allowed to drive home.  Someone NEEDS to stay with you for the first 24 hours after anesthesia.              Please read over the following fact sheets you were given: IF YOU HAVE QUESTIONS ABOUT YOUR PRE-OP INSTRUCTIONS PLEASE CALL [place] - Preparing for Surgery Before surgery, you can play an important role.  Because skin is not sterile, your skin needs to be as free of germs as possible.  You can reduce the number of germs on your skin by washing with CHG (chlorahexidine gluconate) soap before surgery.  CHG is an antiseptic cleaner which kills germs and bonds with the skin to continue killing germs even after washing. Please DO NOT use if you have an allergy to CHG or antibacterial soaps.  If your skin becomes reddened/irritated stop using the CHG and inform your nurse when you arrive at Short Stay. Do not shave (including legs and underarms) for at least 48 hours prior to the first CHG shower.  You may shave your face/neck.  Please follow these instructions carefully:  1.  Shower with CHG Soap the night before surgery and the  morning of surgery.  2.  If you choose to wash your hair, wash your hair first as usual with your normal  shampoo.  3.  After you shampoo, rinse your hair and body thoroughly to remove the shampoo.                             4.  Use CHG as you would any other liquid soap.  You can apply chg directly to the skin and wash.  Gently with a scrungie or  clean washcloth.  5.  Apply the CHG Soap to your body ONLY FROM THE NECK DOWN.   Do   not use on face/ open                           Wound or open sores. Avoid contact with eyes, ears mouth and   genitals (private parts).                       Wash face,  Genitals (private parts) with your normal soap.             6.  Wash thoroughly, paying special attention to the area where your    surgery  will be performed.  7.  Thoroughly rinse your body with warm water from the neck down.  8.  DO NOT shower/wash with your normal soap after using and rinsing off the CHG Soap.                9.  Pat yourself dry with a clean towel.            10.  Wear clean pajamas.            11.  Place clean sheets on your bed the night of your first shower and do not  sleep with pets. Day of  Surgery : Do not apply any lotions/deodorants the morning of surgery.  Please wear clean clothes to the hospital/surgery center.  FAILURE TO FOLLOW THESE INSTRUCTIONS MAY RESULT IN THE CANCELLATION OF YOUR SURGERY  PATIENT SIGNATURE_________________________________  NURSE SIGNATURE__________________________________  ________________________________________________________________________

## 2022-06-12 NOTE — Progress Notes (Signed)
Please place orders for PAT appointment scheduled 06/13/22.

## 2022-06-13 ENCOUNTER — Encounter (HOSPITAL_COMMUNITY)
Admission: RE | Admit: 2022-06-13 | Discharge: 2022-06-13 | Disposition: A | Discharge status: Home / Self Care | Payer: PRIVATE HEALTH INSURANCE | Source: Ambulatory Visit | Attending: Orthopaedic Surgery | Admitting: Orthopaedic Surgery

## 2022-06-13 ENCOUNTER — Encounter (HOSPITAL_COMMUNITY): Payer: Self-pay

## 2022-06-13 VITALS — BP 135/69 | HR 63 | Temp 97.8°F | Resp 12 | Ht 66.5 in | Wt 202.0 lb

## 2022-06-13 DIAGNOSIS — I10 Essential (primary) hypertension: Secondary | ICD-10-CM | POA: Diagnosis not present

## 2022-06-13 DIAGNOSIS — Z01818 Encounter for other preprocedural examination: Secondary | ICD-10-CM | POA: Insufficient documentation

## 2022-06-13 HISTORY — DX: Gastro-esophageal reflux disease without esophagitis: K21.9

## 2022-06-13 HISTORY — DX: Heart failure, unspecified: I50.9

## 2022-06-13 LAB — CBC
HCT: 41.9 % (ref 36.0–46.0)
Hemoglobin: 13.4 g/dL (ref 12.0–15.0)
MCH: 29.7 pg (ref 26.0–34.0)
MCHC: 32 g/dL (ref 30.0–36.0)
MCV: 92.9 fL (ref 80.0–100.0)
Platelets: 145 10*3/uL — ABNORMAL LOW (ref 150–400)
RBC: 4.51 MIL/uL (ref 3.87–5.11)
RDW: 12.3 % (ref 11.5–15.5)
WBC: 3.3 10*3/uL — ABNORMAL LOW (ref 4.0–10.5)
nRBC: 0 % (ref 0.0–0.2)

## 2022-06-13 LAB — BASIC METABOLIC PANEL
Anion gap: 5 (ref 5–15)
BUN: 15 mg/dL (ref 8–23)
CO2: 26 mmol/L (ref 22–32)
Calcium: 9.6 mg/dL (ref 8.9–10.3)
Chloride: 110 mmol/L (ref 98–111)
Creatinine, Ser: 0.75 mg/dL (ref 0.44–1.00)
GFR, Estimated: >60 mL/min (ref >60)
Glucose, Bld: 84 mg/dL (ref 70–99)
Potassium: 3.9 mmol/L (ref 3.5–5.1)
Sodium: 141 mmol/L (ref 135–145)

## 2022-06-16 ENCOUNTER — Other Ambulatory Visit: Payer: Self-pay

## 2022-06-17 NOTE — Anesthesia Preprocedure Evaluation (Addendum)
Anesthesia Evaluation  Patient identified by MRN, date of birth, ID band Patient awake    Reviewed: Allergy & Precautions, NPO status , Patient's Chart, lab work & pertinent test results  History of Anesthesia Complications (+) DIFFICULT AIRWAY and history of anesthetic complications  Airway Mallampati: II  TM Distance: >3 FB Neck ROM: Full    Dental no notable dental hx. (+) Teeth Intact, Dental Advisory Given   Pulmonary sleep apnea and Continuous Positive Airway Pressure Ventilation ,    Pulmonary exam normal breath sounds clear to auscultation       Cardiovascular hypertension, Pt. on medications +CHF  Normal cardiovascular exam Rhythm:Regular Rate:Normal  Echo 10/04/2021 1. Left ventricular ejection fraction, by estimation, is 45 to 50%. The  left ventricle has mildly decreased function. The left ventricle has no  regional wall motion abnormalities. Left ventricular diastolic parameters  are consistent with Grade I  diastolic dysfunction (impaired relaxation).  2. Right ventricular systolic function is normal. The right ventricular  size is normal. There is normal pulmonary artery systolic pressure.  3. Left atrial size was mild to moderately dilated.  4. The mitral valve is normal in structure. Trivial mitral valve  regurgitation. No evidence of mitral stenosis.  5. The aortic valve is normal in structure. Aortic valve regurgitation is  not visualized. No aortic stenosis is present.    Neuro/Psych    GI/Hepatic Neg liver ROS, GERD  ,  Endo/Other  negative endocrine ROS  Renal/GU Lab Results      Component                Value               Date                      CREATININE               0.75                06/13/2022               K                        3.9                 06/13/2022                    Musculoskeletal negative musculoskeletal ROS (+)   Abdominal (+) + obese,   Peds   Hematology Lab Results      Component                Value               Date                            HGB                      13.4                06/13/2022                HCT                      41.9                06/13/2022  PLT                      145 (L)             06/13/2022              Anesthesia Other Findings All: PCN, Tdap  Reproductive/Obstetrics negative OB ROS                           Anesthesia Physical Anesthesia Plan  ASA: 3  Anesthesia Plan: General   Post-op Pain Management: Ketamine IV*   Induction: Intravenous  PONV Risk Score and Plan: Treatment may vary due to age or medical condition, Ondansetron and Midazolam  Airway Management Planned: LMA  Additional Equipment: None  Intra-op Plan:   Post-operative Plan:   Informed Consent: I have reviewed the patients History and Physical, chart, labs and discussed the procedure including the risks, benefits and alternatives for the proposed anesthesia with the patient or authorized representative who has indicated his/her understanding and acceptance.     Dental advisory given  Plan Discussed with:   Anesthesia Plan Comments: (See PAT note 06/13/2022)      Anesthesia Quick Evaluation

## 2022-06-17 NOTE — Progress Notes (Signed)
Anesthesia Chart Review   Case: [id number] Date/Time: 06/24/22 1235   Procedure: RIGHT KNEE ARTHROSCOPY (Right: Knee)   Anesthesia type: Choice   Pre-op diagnosis: RIGHT KNEE CHONDROMALACIA   [facility] RO[facility] 06 / [facility]   Surgeons: Melrose Nakayama, MD       DISCUSSION:61 y.o.n ever smoker with h/o HTN, h/o gastric bypass, sleep apnea, breast cancer (s/p chemo, radiation, left mastectomy), CHF, paroxysmal atrial fibrillation, right knee chondromalacia scheduled for above procedure 06/24/2022 with Dr. Melrose Nakayama.   Difficult intubation per previous anesthesia note 04/17/2015.  Per note difficult airway due to large tongue, reduced neck mobility, edematous airway, anterior larynx. Glidescope used. Recommend- induction with short-acting agent, and alternative techniques readily available  Pt last seen by cardiology 10/04/2021. Per OV note volume status good, cardiac CT ordered to evaluate for ischemia.   CT Coronary 10/22/2021 with no evidence of CAD.   Anticipate pt can proceed with planned procedure barring acute status change.   VS: BP 135/69   Pulse 63   Temp 36.6 C (Oral)   Resp 12   Ht 5' 6.5" (1.689 m)   Wt 91.6 kg   LMP  (LMP Unknown)   SpO2 98%   BMI 32.12 kg/m   PROVIDERS: Perrin Maltese, MD is PCP   Cardiologist - Janalee Dane, MD LABS: Labs reviewed: Acceptable for surgery. (all labs ordered are listed, but only abnormal results are displayed)  Labs Reviewed  CBC - Abnormal; Notable for the following components:      Result Value   WBC 3.3 (*)    Platelets 145 (*)    All other components within normal limits  BASIC METABOLIC PANEL     IMAGES: CT Coronary 10/22/2021 IMPRESSION: 1. Normal coronary calcium score of 0. Patient is low risk for coronary events.   2. Normal coronary origin with right dominance.   3. No evidence of CAD.   4. CAD-RADS 0.   5. Etiology for cardiomyopathy is non ischemic.  EKG: 06/13/2022 Rate 73 bpm  Normal sinus  rhythm Left axis deviation Left ventricular hypertrophy ( R in aVL , Cornell product , Romhilt-Estes ) Abnormal ECG When compared with ECG of 03-Jun-2021 10:21, PREVIOUS ECG IS PRESENT No significant change since last tracing  CV: Echo 10/04/2021 1. Left ventricular ejection fraction, by estimation, is 45 to 50%. The  left ventricle has mildly decreased function. The left ventricle has no  regional wall motion abnormalities. Left ventricular diastolic parameters  are consistent with Grade I  diastolic dysfunction (impaired relaxation).   2. Right ventricular systolic function is normal. The right ventricular  size is normal. There is normal pulmonary artery systolic pressure.   3. Left atrial size was mild to moderately dilated.   4. The mitral valve is normal in structure. Trivial mitral valve  regurgitation. No evidence of mitral stenosis.   5. The aortic valve is normal in structure. Aortic valve regurgitation is  not visualized. No aortic stenosis is present.  Past Medical History:  Diagnosis Date   Atrial fibrillation ([place])    Breast cancer [facility]) 1990/1997    left mastectomy ,had recurrence in left suprclavic nodes several yrs ago-treated with radiation and chemo   CHF (congestive heart failure) ([place])    Colon polyp 6433   Complication of anesthesia    Difficult intubation    expectorated blood 3-4 days after surgery in 2011   Difficult intubation    hypoxia with endoscopy in 2016   GERD (gastroesophageal reflux disease)  History of chemotherapy    5-FU/AC in 1990 under the care of Dr. Oliva Bustard. pt also tx with tamoxifen and arimidex   History of radiation therapy    radiation treat at [facility]   Hypertension 2008   Personal history of chemotherapy 1990   BREAST CA   Personal history of malignant neoplasm of breast 1990    left mastectomy    Personal history of radiation therapy 1997   BREAST CA   Sinusitis    Sleep apnea    history of, went away after gastric surgery     Past Surgical History:  Procedure Laterality Date   [place]   COLONOSCOPY  2008, 2015   Dr. Jamal Collin   COLONOSCOPY WITH PROPOFOL N/A 10/12/2019   Procedure: COLONOSCOPY WITH PROPOFOL;  Surgeon: Robert Bellow, MD;  Location: [facility] ENDOSCOPY;  Service: Endoscopy;  Laterality: N/A;  DR BYRNETT WILL UPDATE H&P ON PROCEDURE DATE   DILATION AND CURETTAGE OF UTERUS     KNEE ARTHROPLASTY Left 09/07/2017   Procedure: COMPUTER ASSISTED TOTAL KNEE ARTHROPLASTY;  Surgeon: Dereck Leep, MD;  Location: [facility] ORS;  Service: Orthopedics;  Laterality: Left;   KNEE ARTHROSCOPY Bilateral    KNEE SURGERY Left 2009, 2010, 2012   LAPAROSCOPIC GASTRIC RESTRICTIVE DUODENAL PROCEDURE (DUODENAL SWITCH) N/A 04/17/2015   Procedure: LAPAROSCOPIC GASTRIC RESTRICTIVE DUODENAL PROCEDURE, single anastamosis ;  Surgeon: Bonner Puna, MD;  Location: [facility] ORS;  Service: General;  Laterality: N/A;   MASTECTOMY Left 1990   BREAST CA   TONSILLECTOMY     UPPER GI ENDOSCOPY N/A 04/17/2015   Procedure: UPPER GI ENDOSCOPY;  Surgeon: Bonner Puna, MD;  Location: [facility] ORS;  Service: General;  Laterality: N/A;    MEDICATIONS:  albuterol (VENTOLIN HFA) 108 (90 Base) MCG/ACT inhaler   carvedilol (COREG) 3.125 MG tablet   Cholecalciferol (VITAMIN D3) 1.25 MG (50000 UT) CAPS   Cholecalciferol (VITAMIN D3) 1.25 MG (50000 UT) CAPS   ciprofloxacin (CIPRO) 500 MG tablet   doxycycline (VIBRAMYCIN) 100 MG capsule   furosemide (LASIX) 20 MG tablet   Insulin Pen Needle 32G X 6 MM MISC   Multiple Vitamin (MULTIVITAMIN WITH MINERALS) TABS tablet   NON FORMULARY   phentermine (ADIPEX-P) 37.5 MG tablet   sacubitril-valsartan (ENTRESTO) 97-103 MG   Semaglutide, 2 MG/DOSE, (OZEMPIC, 2 MG/DOSE,) 8 MG/3ML SOPN   Semaglutide,0.25 or 0.'5MG'$ /DOS, (OZEMPIC, 0.25 OR 0.5 MG/DOSE,) 2 MG/1.5ML SOPN   Semaglutide-Weight Management (WEGOVY) 2.4 MG/0.75ML SOAJ   No current facility-administered medications  for this encounter.    [name], PA-C [facility]-Surgical Testing [phone]

## 2022-06-18 NOTE — H&P (Deleted)
Mandy Roberts Roberts is an 61 y.o. female.   Chief Complaint: Right knee pain HPI: Mandy Roberts Roberts is here today for follow-up of her right knee.  Unfortunately it continues to bother her.  She only got minimal relief from a cortisone shot.  She states she has tried gel shots on the other knee in the past and never really gave her any relief. Since last visit she has undergone MRI scan is here today to go over the results.    MRI:  I reviewed an MRI scan films and report of a study done at [facility] on 04/11/22 of the right knee demonstrates some areas of extensive full-thickness cartilage loss of the lateral patellofemoral compartment with some subchondral reactive marrow edema.  There is also mild partial-thickness cartilage loss in the medial patellofemoral compartment.  No medial lateral joint line cartilage loss is noted.  Menisci are intact.  Past Medical History:  Diagnosis Date   Atrial fibrillation ([place])    Breast cancer [facility]) 1990/1997    left mastectomy ,had recurrence in left suprclavic nodes several yrs ago-treated with radiation and chemo   CHF (congestive heart failure) ([place])    Colon polyp 3646   Complication of anesthesia    Difficult intubation    expectorated blood 3-4 days after surgery in 2011   Difficult intubation    hypoxia with endoscopy in 2016   GERD (gastroesophageal reflux disease)    History of chemotherapy    5-FU/AC in 1990 under the care of Dr. Oliva Bustard. pt also tx with tamoxifen and arimidex   History of radiation therapy    radiation treat at [facility]   Hypertension 2008   Personal history of chemotherapy 1990   BREAST CA   Personal history of malignant neoplasm of breast 1990    left mastectomy    Personal history of radiation therapy 1997   BREAST CA   Sinusitis    Sleep apnea    history of, went away after gastric surgery    Past Surgical History:  Procedure Laterality Date   [place]   COLONOSCOPY  2008, 2015   Dr.  Jamal Collin   COLONOSCOPY WITH PROPOFOL N/A 10/12/2019   Procedure: COLONOSCOPY WITH PROPOFOL;  Surgeon: Robert Bellow, MD;  Location: [facility] ENDOSCOPY;  Service: Endoscopy;  Laterality: N/A;  DR BYRNETT WILL UPDATE H&P ON PROCEDURE DATE   DILATION AND CURETTAGE OF UTERUS     KNEE ARTHROPLASTY Left 09/07/2017   Procedure: COMPUTER ASSISTED TOTAL KNEE ARTHROPLASTY;  Surgeon: Dereck Leep, MD;  Location: [facility] ORS;  Service: Orthopedics;  Laterality: Left;   KNEE ARTHROSCOPY Bilateral    KNEE SURGERY Left 2009, 2010, 2012   LAPAROSCOPIC GASTRIC RESTRICTIVE DUODENAL PROCEDURE (DUODENAL SWITCH) N/A 04/17/2015   Procedure: LAPAROSCOPIC GASTRIC RESTRICTIVE DUODENAL PROCEDURE, single anastamosis ;  Surgeon: Bonner Puna, MD;  Location: [facility] ORS;  Service: General;  Laterality: N/A;   MASTECTOMY Left 1990   BREAST CA   TONSILLECTOMY     UPPER GI ENDOSCOPY N/A 04/17/2015   Procedure: UPPER GI ENDOSCOPY;  Surgeon: Bonner Puna, MD;  Location: [facility] ORS;  Service: General;  Laterality: N/A;    Family History  Problem Relation Age of Onset   Heart failure Mother    Hypertension Mother    Hyperlipidemia Mother    Kidney disease Mother    CVA Mother    Hypothyroidism Mother    Breast cancer Maternal Aunt 76   Social History:  reports that she has never smoked. She has never used smokeless tobacco. She reports that she does not drink alcohol and does not use drugs.  Allergies:  Allergies  Allergen Reactions   Tdap [Tetanus-Diphth-Acell Pertussis] Shortness Of Breath   Penicillins Rash    Pt says she's not allergic to penicillin    No medications prior to admission.    No results found for this or any previous visit (from the past 48 hour(s)). No results found.  Review of Systems  Musculoskeletal:  Positive for arthralgias.       Right knee  All other systems reviewed and are negative.   There were no vitals taken for this visit.   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  She is not bad enough for needing a knee replacement at this time.  She has tried Visco supplementation in the past on the opposite knee and it never gave her any relief.  She gets a sharp catching and locking sensation with stairs and steps and sometimes when she goes to extend her knee.  I did discuss a possible arthroscopy to make things better but not new for her knee.  She will continue working her regular job.I reviewed risks of anesthesia and infection as well as potential for DVT related to a knee arthroscopy.  I've stressed the importance of some postoperative physical therapy to optimize results and we will try to set up an appointment.  Two to four  weeks for recovery would be typical but that is a little variable.    Larwance Sachs [name], PA-C 06/18/2022, 1:39 PM

## 2022-06-18 NOTE — H&P (Signed)
Mandy Roberts is an 61 y.o. female.   Chief Complaint: HPI: Mandy Roberts is here today for follow-up of her right knee.  Unfortunately it continues to bother her.  She only got minimal relief from a cortisone shot.  She states she has tried gel shots on the other knee in the past and never really gave her any relief. Since last visit she has undergone MRI scan is here today to go over the results.     MRI:  I reviewed an MRI scan films and report of a study done at [facility] on 04/11/22 of the right knee demonstrates some areas of extensive full-thickness cartilage loss of the lateral patellofemoral compartment with some subchondral reactive marrow edema.  There is also mild partial-thickness cartilage loss in the medial patellofemoral compartment.  No medial lateral joint line cartilage loss is noted.  Menisci are intact.  Past Medical History:  Diagnosis Date   Atrial fibrillation (Mandy Roberts)    Breast cancer [facility]) 1990/1997    left mastectomy ,had recurrence in left suprclavic nodes several yrs ago-treated with radiation and chemo   CHF (congestive heart failure) ([place])    Colon polyp 7116   Complication of anesthesia    Difficult intubation    expectorated blood 3-4 days after surgery in 2011   Difficult intubation    hypoxia with endoscopy in 2016   GERD (gastroesophageal reflux disease)    History of chemotherapy    5-FU/AC in 1990 under the care of Dr. Oliva Bustard. pt also tx with tamoxifen and arimidex   History of radiation therapy    radiation treat at [facility]   Hypertension 2008   Personal history of chemotherapy 1990   BREAST CA   Personal history of malignant neoplasm of breast 1990    left mastectomy    Personal history of radiation therapy 1997   BREAST CA   Sinusitis    Sleep apnea    history of, went away after gastric surgery    Past Surgical History:  Procedure Laterality Date   [place]   COLONOSCOPY  2008, 2015   Dr. Jamal Collin    COLONOSCOPY WITH PROPOFOL N/A 10/12/2019   Procedure: COLONOSCOPY WITH PROPOFOL;  Surgeon: Robert Bellow, MD;  Location: [facility] ENDOSCOPY;  Service: Endoscopy;  Laterality: N/A;  DR BYRNETT WILL UPDATE H&P ON PROCEDURE DATE   DILATION AND CURETTAGE OF UTERUS     KNEE ARTHROPLASTY Left 09/07/2017   Procedure: COMPUTER ASSISTED TOTAL KNEE ARTHROPLASTY;  Surgeon: Dereck Leep, MD;  Location: [facility] ORS;  Service: Orthopedics;  Laterality: Left;   KNEE ARTHROSCOPY Bilateral    KNEE SURGERY Left 2009, 2010, 2012   LAPAROSCOPIC GASTRIC RESTRICTIVE DUODENAL PROCEDURE (DUODENAL SWITCH) N/A 04/17/2015   Procedure: LAPAROSCOPIC GASTRIC RESTRICTIVE DUODENAL PROCEDURE, single anastamosis ;  Surgeon: Bonner Puna, MD;  Location: [facility] ORS;  Service: General;  Laterality: N/A;   MASTECTOMY Left 1990   BREAST CA   TONSILLECTOMY     UPPER GI ENDOSCOPY N/A 04/17/2015   Procedure: UPPER GI ENDOSCOPY;  Surgeon: Bonner Puna, MD;  Location: [facility] ORS;  Service: General;  Laterality: N/A;    Family History  Problem Relation Age of Onset   Heart failure Mother    Hypertension Mother    Hyperlipidemia Mother    Kidney disease Mother    CVA Mother    Hypothyroidism Mother    Breast cancer Maternal Aunt 76   Social History:  reports that  she has never smoked. She has never used smokeless tobacco. She reports that she does not drink alcohol and does not use drugs.  Allergies:  Allergies  Allergen Reactions   Tdap [Tetanus-Diphth-Acell Pertussis] Shortness Of Breath   Penicillins Rash    Pt says she's not allergic to penicillin    No medications prior to admission.    No results found for this or any previous visit (from the past 48 hour(s)). No results found.  Review of Systems  Musculoskeletal:  Positive for arthralgias.       Right knee  All other systems reviewed and are negative.   There were no vitals taken for this visit. Physical Exam Constitutional:      Appearance: Normal  appearance.  HENT:     Head: Normocephalic and atraumatic.     Nose: Nose normal.     Mouth/Throat:     Pharynx: Oropharynx is clear.  Eyes:     Extraocular Movements: Extraocular movements intact.  Cardiovascular:     Rate and Rhythm: Normal rate.     Pulses: Normal pulses.  Pulmonary:     Effort: Pulmonary effort is normal.  Abdominal:     Palpations: Abdomen is soft.  Musculoskeletal:     Cervical back: Normal range of motion.     Comments: Examination of the right knee shows range of motion from 0-120 of flexion.  She has pain at and flexion anteriorly.  She has 1+ crepitation.  No effusion.  Tenderness palpation mostly along her patellar facet.  Ligaments are stable.  Calf is soft and nontender.  She is neurovascularly intact distally.  Skin:    General: Skin is warm and dry.  Neurological:     General: No focal deficit present.     Mental Status: She is alert and oriented to person, place, and time. Mental status is at baseline.  Psychiatric:        Mood and Affect: Mood normal.        Behavior: Behavior normal.        Thought Content: Thought content normal.        Judgment: Judgment normal.      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  She is not bad enough for needing a knee replacement at this time.  She has tried Visco supplementation in the past on the opposite knee and it never gave her any relief.  She gets a sharp catching and locking sensation with stairs and steps and sometimes when she goes to extend her knee.  I did discuss a possible arthroscopy to make things better but not new for her knee.  She will continue working her regular job.I reviewed risks of anesthesia and infection as well as potential for DVT related to a knee arthroscopy.  I've stressed the  importance of some postoperative physical therapy to optimize results and we will try to set up an appointment.  Two to four  weeks for recovery would be typical but that is a little variable.  Larwance Sachs Cheyane Ayon, PA-C 06/18/2022, 1:54 PM

## 2022-06-24 ENCOUNTER — Ambulatory Visit (HOSPITAL_BASED_OUTPATIENT_CLINIC_OR_DEPARTMENT_OTHER): Payer: PRIVATE HEALTH INSURANCE | Admitting: Anesthesiology

## 2022-06-24 ENCOUNTER — Encounter (HOSPITAL_COMMUNITY): Payer: Self-pay | Admitting: Orthopaedic Surgery

## 2022-06-24 ENCOUNTER — Encounter (HOSPITAL_COMMUNITY)
Admission: RE | Disposition: A | Discharge status: Home / Self Care | Payer: Self-pay | Source: Home / Self Care | Attending: Orthopaedic Surgery

## 2022-06-24 ENCOUNTER — Ambulatory Visit (HOSPITAL_COMMUNITY): Payer: PRIVATE HEALTH INSURANCE | Admitting: Physician Assistant

## 2022-06-24 ENCOUNTER — Other Ambulatory Visit: Payer: Self-pay

## 2022-06-24 ENCOUNTER — Ambulatory Visit (HOSPITAL_COMMUNITY)
Admission: RE | Admit: 2022-06-24 | Discharge: 2022-06-24 | Disposition: A | Discharge status: Home / Self Care | Payer: PRIVATE HEALTH INSURANCE | Attending: Orthopaedic Surgery | Admitting: Orthopaedic Surgery

## 2022-06-24 DIAGNOSIS — I509 Heart failure, unspecified: Secondary | ICD-10-CM

## 2022-06-24 DIAGNOSIS — M1711 Unilateral primary osteoarthritis, right knee: Secondary | ICD-10-CM | POA: Insufficient documentation

## 2022-06-24 DIAGNOSIS — I110 Hypertensive heart disease with heart failure: Secondary | ICD-10-CM

## 2022-06-24 DIAGNOSIS — M2241 Chondromalacia patellae, right knee: Secondary | ICD-10-CM | POA: Diagnosis not present

## 2022-06-24 DIAGNOSIS — G4733 Obstructive sleep apnea (adult) (pediatric): Secondary | ICD-10-CM

## 2022-06-24 DIAGNOSIS — E669 Obesity, unspecified: Secondary | ICD-10-CM | POA: Insufficient documentation

## 2022-06-24 DIAGNOSIS — G4730 Sleep apnea, unspecified: Secondary | ICD-10-CM | POA: Diagnosis not present

## 2022-06-24 DIAGNOSIS — M25561 Pain in right knee: Secondary | ICD-10-CM

## 2022-06-24 DIAGNOSIS — Z01818 Encounter for other preprocedural examination: Secondary | ICD-10-CM

## 2022-06-24 HISTORY — PX: KNEE ARTHROSCOPY: SHX127

## 2022-06-24 LAB — SURGICAL PCR SCREEN
MRSA, PCR: NEGATIVE
Staphylococcus aureus: NEGATIVE

## 2022-06-24 SURGERY — ARTHROSCOPY, KNEE
Anesthesia: General | Site: Knee | Laterality: Right

## 2022-06-24 MED ORDER — FENTANYL CITRATE (PF) 100 MCG/2ML IJ SOLN
INTRAMUSCULAR | Status: AC
  Filled 2022-06-24: qty 2

## 2022-06-24 MED ORDER — LACTATED RINGERS IV SOLN: INTRAVENOUS | Status: DC

## 2022-06-24 MED ORDER — FENTANYL CITRATE (PF) 100 MCG/2ML IJ SOLN
INTRAMUSCULAR | Status: DC | PRN
  Administered 2022-06-24 (×4): 25 ug via INTRAVENOUS

## 2022-06-24 MED ORDER — DEXAMETHASONE SODIUM PHOSPHATE 10 MG/ML IJ SOLN
INTRAMUSCULAR | Status: DC | PRN
  Administered 2022-06-24: 10 mg via INTRAVENOUS

## 2022-06-24 MED ORDER — KETAMINE HCL 10 MG/ML IJ SOLN
INTRAMUSCULAR | Status: DC | PRN
  Administered 2022-06-24: 20 mg via INTRAVENOUS

## 2022-06-24 MED ORDER — ONDANSETRON HCL 4 MG/2ML IJ SOLN
4.0000 mg | Freq: Once | INTRAMUSCULAR | Status: AC
Start: 2022-06-24 — End: 2022-06-24
  Administered 2022-06-24: 4 mg via INTRAVENOUS
  Filled 2022-06-24: qty 2

## 2022-06-24 MED ORDER — KETAMINE HCL 50 MG/5ML IJ SOSY
PREFILLED_SYRINGE | INTRAMUSCULAR | Status: AC
  Filled 2022-06-24: qty 5

## 2022-06-24 MED ORDER — ORAL CARE MOUTH RINSE: 15.0000 mL | Freq: Once | OROMUCOSAL | Status: AC

## 2022-06-24 MED ORDER — BUPIVACAINE-EPINEPHRINE (PF) 0.5% -1:200000 IJ SOLN
INTRAMUSCULAR | Status: AC
  Filled 2022-06-24: qty 30

## 2022-06-24 MED ORDER — SODIUM CHLORIDE 0.9 % IR SOLN
Status: DC | PRN
  Administered 2022-06-24: 3000 mL

## 2022-06-24 MED ORDER — ONDANSETRON HCL 4 MG/2ML IJ SOLN
INTRAMUSCULAR | Status: DC | PRN
  Administered 2022-06-24: 4 mg via INTRAVENOUS

## 2022-06-24 MED ORDER — MIDAZOLAM HCL 5 MG/5ML IJ SOLN
INTRAMUSCULAR | Status: DC | PRN
  Administered 2022-06-24: 2 mg via INTRAVENOUS

## 2022-06-24 MED ORDER — PROPOFOL 10 MG/ML IV BOLUS
INTRAVENOUS | Status: DC | PRN
  Administered 2022-06-24: 120 mg via INTRAVENOUS

## 2022-06-24 MED ORDER — PROPOFOL 10 MG/ML IV BOLUS
INTRAVENOUS | Status: AC
Start: 2022-06-24 — End: ?
  Filled 2022-06-24: qty 20

## 2022-06-24 MED ORDER — BUPIVACAINE-EPINEPHRINE 0.5% -1:200000 IJ SOLN
INTRAMUSCULAR | Status: DC | PRN
  Administered 2022-06-24: 30 mL

## 2022-06-24 MED ORDER — ONDANSETRON HCL 4 MG/2ML IJ SOLN
INTRAMUSCULAR | Status: AC
  Filled 2022-06-24: qty 2

## 2022-06-24 MED ORDER — EPINEPHRINE (ANAPHYLAXIS) 1 MG/ML IJ SOLN
INTRAMUSCULAR | Status: DC | PRN
  Administered 2022-06-24: 1 mg

## 2022-06-24 MED ORDER — MIDAZOLAM HCL 2 MG/2ML IJ SOLN
INTRAMUSCULAR | Status: AC
  Filled 2022-06-24: qty 2

## 2022-06-24 MED ORDER — VANCOMYCIN HCL IN DEXTROSE 1-5 GM/200ML-% IV SOLN
1000.0000 mg | INTRAVENOUS | Status: AC
  Administered 2022-06-24: 1000 mg via INTRAVENOUS
  Filled 2022-06-24: qty 200

## 2022-06-24 MED ORDER — DEXAMETHASONE SODIUM PHOSPHATE 10 MG/ML IJ SOLN
INTRAMUSCULAR | Status: AC
Start: 2022-06-24 — End: ?
  Filled 2022-06-24: qty 1

## 2022-06-24 MED ORDER — EPINEPHRINE PF 1 MG/ML IJ SOLN
INTRAMUSCULAR | Status: AC
  Filled 2022-06-24: qty 1

## 2022-06-24 MED ORDER — CEFAZOLIN SODIUM-DEXTROSE 2-4 GM/100ML-% IV SOLN: 2.0000 g | Freq: Once | INTRAVENOUS | Status: DC

## 2022-06-24 MED ORDER — LIDOCAINE 2% (20 MG/ML) 5 ML SYRINGE
INTRAMUSCULAR | Status: DC | PRN
  Administered 2022-06-24: 80 mg via INTRAVENOUS

## 2022-06-24 MED ORDER — MIDAZOLAM HCL 2 MG/2ML IJ SOLN: 2.0000 mg | INTRAMUSCULAR | Status: DC

## 2022-06-24 MED ORDER — CHLORHEXIDINE GLUCONATE 0.12 % MT SOLN
15.0000 mL | Freq: Once | OROMUCOSAL | Status: AC
  Administered 2022-06-24: 15 mL via OROMUCOSAL

## 2022-06-24 MED ORDER — CEFAZOLIN SODIUM-DEXTROSE 2-4 GM/100ML-% IV SOLN
INTRAVENOUS | Status: AC
  Filled 2022-06-24: qty 100

## 2022-06-24 MED ORDER — HYDROCODONE-ACETAMINOPHEN 5-325 MG PO TABS
1.0000 | ORAL_TABLET | Freq: Four times a day (QID) | ORAL | 0 refills | Status: DC | PRN
  Filled 2022-06-24: qty 15, 2d supply, fill #0

## 2022-06-24 MED ORDER — FENTANYL CITRATE PF 50 MCG/ML IJ SOSY: 100.0000 ug | PREFILLED_SYRINGE | INTRAMUSCULAR | Status: DC

## 2022-06-24 SURGICAL SUPPLY — 35 items
BAG COUNTER SPONGE SURGICOUNT (BAG) ×2 IMPLANT
BLADE EXCALIBUR 4.0X13 (MISCELLANEOUS) IMPLANT
BNDG ELASTIC 6X5.8 VLCR STR LF (GAUZE/BANDAGES/DRESSINGS) ×2 IMPLANT
BNDG GAUZE DERMACEA FLUFF (GAUZE/BANDAGES/DRESSINGS) ×1
BNDG GAUZE DERMACEA FLUFF 4 (GAUZE/BANDAGES/DRESSINGS) ×1 IMPLANT
BONE TUNNEL PLUG CANNULATED (MISCELLANEOUS) ×2 IMPLANT
BURR OVAL 8 FLU 4.0X13 (MISCELLANEOUS) ×2 IMPLANT
COVER SURGICAL LIGHT HANDLE (MISCELLANEOUS) IMPLANT
DISSECTOR 3.5MM X 13CM (MISCELLANEOUS) ×2 IMPLANT
DRAPE ARTHROSCOPY W/POUCH 114 (DRAPES) ×2 IMPLANT
DRAPE SHEET LG 3/4 BI-LAMINATE (DRAPES) ×2 IMPLANT
DRAPE U-SHAPE 47X51 STRL (DRAPES) ×2 IMPLANT
DRSG EMULSION OIL 3X3 NADH (GAUZE/BANDAGES/DRESSINGS) ×2 IMPLANT
DRSG PAD ABDOMINAL 8X10 ST (GAUZE/BANDAGES/DRESSINGS) ×2 IMPLANT
DURAPREP 26ML APPLICATOR (WOUND CARE) ×4 IMPLANT
GAUZE 4X4 16PLY ~~LOC~~+RFID DBL (SPONGE) ×1 IMPLANT
GAUZE SPONGE 4X4 12PLY STRL (GAUZE/BANDAGES/DRESSINGS) ×2 IMPLANT
GLOVE BIO SURGEON STRL SZ8 (GLOVE) ×4 IMPLANT
GLOVE BIOGEL PI IND STRL 8 (GLOVE) ×2 IMPLANT
GLOVE BIOGEL PI INDICATOR 8 (GLOVE) ×2
GOWN STRL REUS W/ TWL XL LVL3 (GOWN DISPOSABLE) ×2 IMPLANT
GOWN STRL REUS W/TWL XL LVL3 (GOWN DISPOSABLE) ×2
KIT BASIN OR (CUSTOM PROCEDURE TRAY) ×2 IMPLANT
MANIFOLD NEPTUNE II (INSTRUMENTS) ×2 IMPLANT
NDL SPNL 18GX3.5 QUINCKE PK (NEEDLE) IMPLANT
NEEDLE HYPO 22GX1.5 SAFETY (NEEDLE) ×2 IMPLANT
NEEDLE SPNL 18GX3.5 QUINCKE PK (NEEDLE) IMPLANT
PACK ARTHROSCOPY WL (CUSTOM PROCEDURE TRAY) ×2 IMPLANT
PAD ARMBOARD 7.5X6 YLW CONV (MISCELLANEOUS) ×4 IMPLANT
PADDING CAST COTTON 6X4 STRL (CAST SUPPLIES) ×1 IMPLANT
PENCIL SMOKE EVACUATOR (MISCELLANEOUS) IMPLANT
SYR CONTROL 10ML LL (SYRINGE) ×2 IMPLANT
TOWEL OR 17X26 10 PK STRL BLUE (TOWEL DISPOSABLE) ×2 IMPLANT
TUBING ARTHROSCOPY IRRIG 16FT (MISCELLANEOUS) ×2 IMPLANT
WATER STERILE IRR 1000ML POUR (IV SOLUTION) ×2 IMPLANT

## 2022-06-24 NOTE — Anesthesia Postprocedure Evaluation (Signed)
Anesthesia Post Note  Patient: Mandy Roberts  Procedure(s) Performed: RIGHT KNEE ARTHROSCOPY (Right: Knee)     Patient location during evaluation: PACU Anesthesia Type: General Level of consciousness: awake and alert Pain management: pain level controlled Vital Signs Assessment: post-procedure vital signs reviewed and stable Respiratory status: spontaneous breathing, nonlabored ventilation, respiratory function stable and patient connected to nasal cannula oxygen Cardiovascular status: blood pressure returned to baseline and stable Postop Assessment: no apparent nausea or vomiting Anesthetic complications: no   No notable events documented.  Last Vitals:  Vitals:   06/24/22 1345 06/24/22 1422  BP: 137/74 135/63  Pulse: (!) 55 61  Resp: 10   Temp:    SpO2: 98% 100%    Last Pain:  Vitals:   06/24/22 1400  TempSrc:   PainSc: 0-No pain                 Barnet Glasgow

## 2022-06-24 NOTE — Anesthesia Procedure Notes (Signed)
Procedure Name: LMA Insertion Date/Time: 06/24/2022 12:29 PM  Performed by: Maxwell Caul, CRNAPre-anesthesia Checklist: Patient identified, Emergency Drugs available, Suction available and Patient being monitored Patient Re-evaluated:Patient Re-evaluated prior to induction Oxygen Delivery Method: Circle system utilized Preoxygenation: Pre-oxygenation with 100% oxygen Induction Type: IV induction LMA: LMA with gastric port inserted LMA Size: 4.0 Number of attempts: 1 Placement Confirmation: positive ETCO2 and breath sounds checked- equal and bilateral Tube secured with: Tape Dental Injury: Teeth and Oropharynx as per pre-operative assessment

## 2022-06-24 NOTE — Interval H&P Note (Signed)
History and Physical Interval Note:  06/24/2022 11:52 AM  Mandy Roberts  has presented today for surgery, with the diagnosis of RIGHT KNEE CHONDROMALACIA.  The various methods of treatment have been discussed with the patient and family. After consideration of risks, benefits and other options for treatment, the patient has consented to  Procedure(s): RIGHT KNEE ARTHROSCOPY (Right) as a surgical intervention.  The patient's history has been reviewed, patient examined, no change in status, stable for surgery.  I have reviewed the patient's chart and labs.  Questions were answered to the patient's satisfaction.     Hessie Dibble

## 2022-06-24 NOTE — Transfer of Care (Signed)
Immediate Anesthesia Transfer of Care Note  Patient: LAMIA MARINER  Procedure(s) Performed: RIGHT KNEE ARTHROSCOPY (Right: Knee)  Patient Location: PACU  Anesthesia Type:General  Level of Consciousness: awake, alert  and oriented  Airway & Oxygen Therapy: Patient Spontanous Breathing and Patient connected to face mask oxygen  Post-op Assessment: Report given to RN and Post -op Vital signs reviewed and stable  Post vital signs: Reviewed and stable  Last Vitals:  Vitals Value Taken Time  BP 146/71 06/24/22 1309  Temp    Pulse 65 06/24/22 1310  Resp 8 06/24/22 1310  SpO2 100 % 06/24/22 1310  Vitals shown include unvalidated device data.  Last Pain:  Vitals:   06/24/22 1053  TempSrc: Oral         Complications: No notable events documented.

## 2022-06-24 NOTE — Brief Op Note (Signed)
RAEGYN RENDA [id number] 06/24/2022   PRE-OP DIAGNOSIS: right knee CP  POST-OP DIAGNOSIS: same  PROCEDURE: right knee scope  ANESTHESIA: general  Hessie Dibble   Dictation #:  [id number]

## 2022-06-24 NOTE — Op Note (Unsigned)
NAME: Mandy Roberts, Mandy Roberts MEDICAL RECORD NO: [id number] ACCOUNT NO: [id number] DATE OF BIRTH: August 19, 1961 FACILITY: [facility] LOCATION: [facility] PHYSICIAN: Monico Blitz. Rhona Raider, MD  Operative Report   DATE OF PROCEDURE: 06/24/2022  PREOPERATIVE DIAGNOSIS:  Right knee chondromalacia patella.  POSTOPERATIVE DIAGNOSIS:  Right knee chondromalacia patella.  PROCEDURE:  Right knee abrasion, chondroplasty of patellofemoral.  ANESTHESIA:  General.  ATTENDING SURGEON:  Monico Blitz. Ethan Clayburn, MD  INDICATIONS FOR PROCEDURE:  The patient is a 61 year old woman with a long history of pain related to work injury.  This has persisted despite multiple conservative interventions including injections and pills and therapy.  By MRI scan, she has  degenerative change in the knee, mostly confined to the patellofemoral portion of the knee.  She has been through gel shots, which have not helped.  At this point, is offered arthroscopic intervention.  Informed operative consent was obtained after  discussion of possible complications including reaction to anesthesia and infection.  SUMMARY OF FINDINGS OF PROCEDURE:  Under general anesthesia, an arthroscopy of the right knee was performed.  Medial compartment showed no evidence of meniscal or articular cartilage injury.  The same was true of the lateral compartment.  ACL looked  normal.  In the patellofemoral joint, most of this also looked relatively benign except for a portion of the lateral intertrochlear groove.  She had some bare bone there with some flaps with articular cartilage.  I performed a thorough chondroplasty and  abrasion of bleeding bone in some small areas.  She was scheduled to go home same day.  DESCRIPTION OF PROCEDURE:  The patient was taken to the operating suite where general anesthetic was applied without difficulty.  She was positioned supine and prepped and draped in normal sterile fashion.  After the administration of vancomycin and  Kefzol as  perioperative antibiotics and an appropriate timeout, we performed an arthroscopy of the right knee through a total of 2 portals.  Findings were as noted above and procedure consisted of the chondroplasty and abrasion, chondroplasty of  patellofemoral.  The knee was thoroughly irrigated, followed by placement of Marcaine with epinephrine and morphine.  Adaptic was applied followed by dry gauze and a loose Ace wrap.  Estimated blood loss and intraoperative fluids can be obtained from  anesthesia records.  DISPOSITION:  The patient was extubated in the operating room and taken to recovery room in stable condition.  Plans for her to go home same day and to follow up in the office in less than a week.  I will contact her by phone tonight.   PUS D: 06/24/2022 1:07:07 pm T: 06/24/2022 2:49:00 pm  JOB: [id number]/ [id number]

## 2022-06-25 ENCOUNTER — Encounter (HOSPITAL_COMMUNITY): Payer: Self-pay | Admitting: Orthopaedic Surgery

## 2022-06-26 ENCOUNTER — Other Ambulatory Visit (HOSPITAL_COMMUNITY): Payer: Self-pay | Admitting: Orthopedic Surgery

## 2022-06-26 ENCOUNTER — Ambulatory Visit (HOSPITAL_COMMUNITY)
Admission: RE | Admit: 2022-06-26 | Discharge: 2022-06-26 | Disposition: A | Discharge status: Home / Self Care | Payer: PRIVATE HEALTH INSURANCE | Source: Ambulatory Visit | Attending: Cardiovascular Disease | Admitting: Cardiovascular Disease

## 2022-06-26 DIAGNOSIS — M79604 Pain in right leg: Secondary | ICD-10-CM | POA: Diagnosis present

## 2022-06-26 DIAGNOSIS — M7989 Other specified soft tissue disorders: Secondary | ICD-10-CM | POA: Insufficient documentation

## 2022-07-07 ENCOUNTER — Ambulatory Visit: Payer: PRIVATE HEALTH INSURANCE | Attending: Orthopaedic Surgery

## 2022-07-07 DIAGNOSIS — R262 Difficulty in walking, not elsewhere classified: Secondary | ICD-10-CM | POA: Diagnosis present

## 2022-07-07 DIAGNOSIS — M6281 Muscle weakness (generalized): Secondary | ICD-10-CM | POA: Insufficient documentation

## 2022-07-07 DIAGNOSIS — M25561 Pain in right knee: Secondary | ICD-10-CM | POA: Diagnosis present

## 2022-07-07 NOTE — Therapy (Addendum)
OUTPATIENT PHYSICAL THERAPY LOWER EXTREMITY EVALUATION   Patient Name: Mandy Roberts MRN: [id number] DOB:09-21-1961, 61 y.o., female Today's Date: 07/07/2022   PT End of Session - 07/07/22 1027     Visit Number 1    Number of Visits 9   eval + 8 visits   Date for PT Re-Evaluation 07/23/22    Authorization Type 1/9    Authorization - Visit Number 8    Authorization - Number of Visits 9    PT Start Time 4098    PT Stop Time 1100    PT Time Calculation (min) 35 min    Activity Tolerance Patient limited by pain    Behavior During Therapy [facility] for tasks assessed/performed             Past Medical History:  Diagnosis Date   Atrial fibrillation ([place])    Breast cancer ([place]) 1990/1997    left mastectomy ,had recurrence in left suprclavic nodes several yrs ago-treated with radiation and chemo   CHF (congestive heart failure) ([place])    Colon polyp 1191   Complication of anesthesia    Difficult intubation    expectorated blood 3-4 days after surgery in 2011   Difficult intubation    hypoxia with endoscopy in 2016   GERD (gastroesophageal reflux disease)    History of chemotherapy    5-FU/AC in 1990 under the care of Dr. Oliva Bustard. pt also tx with tamoxifen and arimidex   History of radiation therapy    radiation treat at [facility]   Hypertension 2008   Personal history of chemotherapy 1990   BREAST CA   Personal history of malignant neoplasm of breast 1990    left mastectomy    Personal history of radiation therapy 1997   BREAST CA   Sinusitis    Sleep apnea    history of, went away after gastric surgery   Past Surgical History:  Procedure Laterality Date   [place]   COLONOSCOPY  2008, 2015   Dr. Jamal Collin   COLONOSCOPY WITH PROPOFOL N/A 10/12/2019   Procedure: COLONOSCOPY WITH PROPOFOL;  Surgeon: Robert Bellow, MD;  Location: [facility] ENDOSCOPY;  Service: Endoscopy;  Laterality: N/A;  DR BYRNETT WILL UPDATE H&P ON PROCEDURE DATE    DILATION AND CURETTAGE OF UTERUS     KNEE ARTHROPLASTY Left 09/07/2017   Procedure: COMPUTER ASSISTED TOTAL KNEE ARTHROPLASTY;  Surgeon: Dereck Leep, MD;  Location: [facility] ORS;  Service: Orthopedics;  Laterality: Left;   KNEE ARTHROSCOPY Bilateral    KNEE ARTHROSCOPY Right 06/24/2022   Procedure: RIGHT KNEE ARTHROSCOPY;  Surgeon: Melrose Nakayama, MD;  Location: [facility] ORS;  Service: Orthopedics;  Laterality: Right;   KNEE SURGERY Left 2009, 2010, 2012   LAPAROSCOPIC GASTRIC RESTRICTIVE DUODENAL PROCEDURE (DUODENAL SWITCH) N/A 04/17/2015   Procedure: LAPAROSCOPIC GASTRIC RESTRICTIVE DUODENAL PROCEDURE, single anastamosis ;  Surgeon: Bonner Puna, MD;  Location: [facility] ORS;  Service: General;  Laterality: N/A;   MASTECTOMY Left 1990   BREAST CA   TONSILLECTOMY     UPPER GI ENDOSCOPY N/A 04/17/2015   Procedure: UPPER GI ENDOSCOPY;  Surgeon: Bonner Puna, MD;  Location: [facility] ORS;  Service: General;  Laterality: N/A;   Patient Active Problem List   Diagnosis Date Noted   Dental abscess 05/27/2020   Abscess of mandible_left 05/26/2020   Thrombocytopenia ([place]) 05/26/2020   Facial cellulitis_left 05/26/2020   Varicose veins of both lower extremities with inflammation 05/12/2019   Chronic venous insufficiency  05/12/2019   Status post total left knee replacement 09/07/2017   Chronic bilateral thoracic back pain 09/01/2017   Neck pain 09/01/2017   Symptomatic mammary hypertrophy 09/01/2017   Difficult airway for intubation 04/09/2015   Chronic nasal congestion 04/09/2015   Atrial fibrillation ([place]) 09/16/2013   Obstructive sleep apnea 09/16/2013   Morbid obesity ([place]) 09/16/2013   Personal history of breast cancer 02/09/2013   Hypertension    Cancer ([place])     PCP: Perrin Maltese, MD  REFERRING PROVIDER: Melrose Nakayama, MD  REFERRING DIAG: acute pain of right knee  THERAPY DIAG:  Acute pain of right knee  Difficulty in walking, not elsewhere classified  Muscle weakness  (generalized)  Rationale for Evaluation and Treatment Rehabilitation  ONSET DATE: 06/24/2022  SUBJECTIVE:   SUBJECTIVE STATEMENT: Pt arrives to OPPT with c/o R knee and R thigh pain (thigh>knee) secondary to recent procedure. Since procedure, pt notes she's having increased pain levels, and is managing pain with over the counter medication. Pt currently stating that she does not take stronger prescription medication as she is still driving. Pt notes she ambulates with hurry cane at home. Since procedure, pt notes increased difficulty with: prolonged walking/standing, car transfers, tub transfers, LB dressing, stairs, bed mobility, driving, cooking, cleaning, grocery shopping. Pt states that due to N/T radiating into R foot she is unable to feel break/pedal, and is considering having family assist with transportation.  PERTINENT HISTORY: Pt is s/p R knee arthroscopy on 06/24/22 by Dr. Rhona Raider and was DC home with RW. Pt to attend 3 PT visits prior to surgery for conservative management.  PAIN:  Are you having pain? Yes: NPRS scale: 6-7/10 Pain location: R thigh Pain description: dull, achey, burning  PRECAUTIONS: Fall  WEIGHT BEARING RESTRICTIONS No  FALLS:  Has patient fallen in last 6 months? Yes. 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 PROCEDURE:  Right knee abrasion, chondroplasty of patellofemoral.  PATIENT SURVEYS:  FOTO 29%  COGNITION:  Overall cognitive status: Within functional limits for tasks  assessed     SENSATION: TTP along R lateral thigh, anterior knee, joint space, and medial/lateral knee  EDEMA:  Circumferential: 50cm (R knee), 70cm (R thigh)   POSTURE: rounded shoulders and forward head  PALPATION: Increased edema noted to R thigh and knee; TTP along lateral thigh and knee. Unable to tolerate patellar mobilizations secondary to pain/edema.  LOWER EXTREMITY ROM:  Active ROM Right eval Left eval  Hip flexion 40deg WFL  Hip extension    Hip abduction  [facility]  Hip adduction    Hip internal rotation  [facility]  Hip external rotation  [facility]  Knee flexion 30deg WFL  Knee extension 0deg [facility]  Ankle dorsiflexion WFL   Ankle plantarflexion WFL   Ankle inversion    Ankle eversion     (Blank rows = not tested)  LOWER EXTREMITY MMT:  MMT Right eval Left eval  Hip flexion 2-   Hip extension    Hip abduction    Hip adduction    Hip internal rotation  Hip external rotation    Knee flexion 3-   Knee extension 3-   Ankle dorsiflexion    Ankle plantarflexion    Ankle inversion    Ankle eversion     (Blank rows = not tested)   FUNCTIONAL TESTS:  5 times sit to stand: 36sec, demonstrating increased R hip ABD to avoid R knee flexion with descent. Increased time/effort for standing. BUE for support on arm rests.  GAIT: Distance walked: 30f x2 Assistive device utilized: Single point cane Level of assistance: SBA Comments: 3 point gait pattern; decreased R stance, R foot clearance, L step length, and RUE swing    TODAY'S TREATMENT: Therapeutic Exercise:  Supine SAQ on bolster x5  Seated knee flexion on towel x5, 5' hold (stopping before point of pain)  Quad set x5, 5' hold     PATIENT EDUCATION:  Education details: pain management, family assist with transportation, taking medication per MD recommendation, mechanics, HEP Person educated: Patient Education method: Explanation, Demonstration, Tactile cues, Verbal cues, and Handouts Education comprehension:  verbalized understanding   HOME EXERCISE PROGRAM: Refer to TherEx above  ASSESSMENT:  CLINICAL IMPRESSION: Patient is a 61y.o. F who was seen today for physical therapy evaluation and treatment for c/o R knee and thigh pain. Significant PMH includes:  Afib, hx breast CA, CHF, GERD, HTN, hysterectomy, and R knee arthroplasty. Due to recent R knee arthroplasty, pt presents with the following objective impairments below, which impair activity and participation limitations stated below. 5x STS score indicates pt is at increased risk of falls and therefore further injury. Pt educated to take prescription pain medication as prescribed by MD for better tolerance to activity; pt verbalized understanding. Pt with good prognosis considering activity level, health literacy, hx of therapy, and motivation. Pt will continue to benefit from skilled OPPT services to address deficits, improve functional limitations, and improve overall quality of life.   OBJECTIVE IMPAIRMENTS Abnormal gait, decreased activity tolerance, decreased balance, decreased endurance, decreased mobility, difficulty walking, decreased ROM, decreased strength, increased edema, impaired perceived functional ability, impaired flexibility, improper body mechanics, postural dysfunction, and pain.   ACTIVITY LIMITATIONS lifting, bending, sitting, standing, squatting, sleeping, stairs, transfers, bed mobility, dressing, locomotion level, and car transfers, bed mobility  PARTICIPATION LIMITATIONS: meal prep, cleaning, laundry, driving, shopping, community activity, and occupation  PERSONAL FACTORS Age and 3+ comorbidities: HTN, hx CA, CHF  are also affecting patient's functional outcome.   REHAB POTENTIAL: Good  CLINICAL DECISION MAKING: Stable/uncomplicated  EVALUATION COMPLEXITY: Low   GOALS: Goals reviewed with patient? Yes  SHORT TERM GOALS: Target date: 07/28/2022  Pt will be independent and compliant with HEP in order to decrease  knee pain and increase strength in order to improve pain-free function at home and work. Baseline: HEP provided Goal status: INITIAL   LONG TERM GOALS: Target date: 08/18/2022   Patient will increase FOTO score to equal to or greater than 71% to demonstrate statistically significant improvement in mobility and quality of life. Baseline: 61% Goal status: INITIAL  2.  Patient will report a worst pain of 1-2/10 on VAS in R knee to improve tolerance with ADLs and reduced symptoms with activities. Baseline: 6-7/10 Goal status: INITIAL  3.  Pt will be able to complete 5x STS in </= 11sec indicating decreased risk of falls per this outcome measure based on pt appropriate age group. Baseline: 36sec Goal status: INITIAL  4.  Pt will demonstrate grossly 4+/5 RLE strength for improved independence with ADL's, IADL's, and functional mobility.  Baseline: 2-/5 to 3-/5 Goal status: INITIAL    PLAN: PT FREQUENCY: 1-2x/week  PT DURATION: 6 weeks  PLANNED INTERVENTIONS: Therapeutic exercises, Therapeutic activity, Neuromuscular re-education, Balance training, Gait training, Patient/Family education, Self Care, Joint mobilization, Joint manipulation, Stair training, DME instructions, Dry Needling, Electrical stimulation, Cryotherapy, Moist heat, scar mobilization, Traction, Ultrasound, Ionotophoresis '4mg'$ /ml Dexamethasone, Manual therapy, and Re-evaluation  PLAN FOR NEXT SESSION: progress interventions as appropriate pending DOMS/pain     Herminio Commons, PT, DPT 2:40 PM,07/07/22

## 2022-07-09 ENCOUNTER — Ambulatory Visit: Payer: PRIVATE HEALTH INSURANCE | Admitting: Physical Therapy

## 2022-07-09 ENCOUNTER — Other Ambulatory Visit: Payer: Self-pay

## 2022-07-09 MED ORDER — PREDNISONE 5 MG PO TABS
ORAL_TABLET | ORAL | 0 refills | Status: DC
  Filled 2022-07-09: qty 21, 6d supply, fill #0

## 2022-07-14 ENCOUNTER — Ambulatory Visit: Payer: PRIVATE HEALTH INSURANCE | Attending: Orthopaedic Surgery

## 2022-07-14 DIAGNOSIS — M25561 Pain in right knee: Secondary | ICD-10-CM | POA: Diagnosis present

## 2022-07-14 DIAGNOSIS — M6281 Muscle weakness (generalized): Secondary | ICD-10-CM | POA: Insufficient documentation

## 2022-07-14 DIAGNOSIS — R262 Difficulty in walking, not elsewhere classified: Secondary | ICD-10-CM | POA: Insufficient documentation

## 2022-07-14 NOTE — Therapy (Signed)
OUTPATIENT PHYSICAL THERAPY LOWER EXTREMITY TREATMENT   Patient Name: Mandy Roberts MRN: [id number] DOB:01-23-1961, 61 y.o., female Today's Date: 07/14/2022   PT End of Session - 07/14/22 1021     Visit Number 2    Number of Visits 9   eval + 8 visits   Date for PT Re-Evaluation 07/23/22    Authorization Type 1/9    Authorization - Visit Number 8    Authorization - Number of Visits 9    PT Start Time 8588    PT Stop Time 1056    PT Time Calculation (min) 39 min    Activity Tolerance Patient limited by pain;Patient tolerated treatment well    Behavior During Therapy [facility] for tasks assessed/performed             Past Medical History:  Diagnosis Date   Atrial fibrillation ([place])    Breast cancer ([place]) 1990/1997    left mastectomy ,had recurrence in left suprclavic nodes several yrs ago-treated with radiation and chemo   CHF (congestive heart failure) ([place])    Colon polyp 5027   Complication of anesthesia    Difficult intubation    expectorated blood 3-4 days after surgery in 2011   Difficult intubation    hypoxia with endoscopy in 2016   GERD (gastroesophageal reflux disease)    History of chemotherapy    5-FU/AC in 1990 under the care of Dr. Oliva Bustard. pt also tx with tamoxifen and arimidex   History of radiation therapy    radiation treat at [facility]   Hypertension 2008   Personal history of chemotherapy 1990   BREAST CA   Personal history of malignant neoplasm of breast 1990    left mastectomy    Personal history of radiation therapy 1997   BREAST CA   Sinusitis    Sleep apnea    history of, went away after gastric surgery   Past Surgical History:  Procedure Laterality Date   [place]   COLONOSCOPY  2008, 2015   Dr. Jamal Collin   COLONOSCOPY WITH PROPOFOL N/A 10/12/2019   Procedure: COLONOSCOPY WITH PROPOFOL;  Surgeon: Robert Bellow, MD;  Location: [facility] END[facility]COPY;  Service: Endoscopy;  Laterality: N/A;  DR BYRNETT WILL  UPDATE H&P ON PROCEDURE DATE   DILATION AND CURETTAGE OF UTERUS     KNEE ARTHROPLASTY Left 09/07/2017   Procedure: COMPUTER ASSISTED TOTAL KNEE ARTHROPLASTY;  Surgeon: Dereck Leep, MD;  Location: [facility] ORS;  Service: Orthopedics;  Laterality: Left;   KNEE ARTHROSCOPY Bilateral    KNEE ARTHROSCOPY Right 06/24/2022   Procedure: RIGHT KNEE ARTHROSCOPY;  Surgeon: Melrose Nakayama, MD;  Location: [facility];  Service: Orthopedics;  Laterality: Right;   KNEE SURGERY Left 2009, 2010, 2012   LAPAROSCOPIC GASTRIC RESTRICTIVE DUODENAL PROCEDURE (DUODENAL SWITCH) N/A 04/17/2015   Procedure: LAPAROSCOPIC GASTRIC RESTRICTIVE DUODENAL PROCEDURE, single anastamosis ;  Surgeon: Bonner Puna, MD;  Location: [facility] ORS;  Service: General;  Laterality: N/A;   MASTECTOMY Left 1990   BREAST CA   TONSILLECTOMY     UPPER GI ENDOSCOPY N/A 04/17/2015   Procedure: UPPER GI ENDOSCOPY;  Surgeon: Bonner Puna, MD;  Location: [facility] ORS;  Service: General;  Laterality: N/A;   Patient Active Problem List   Diagnosis Date Noted   Dental abscess 05/27/2020   Abscess of mandible_left 05/26/2020   Thrombocytopenia ([place]) 05/26/2020   Facial cellulitis_left 05/26/2020   Varicose veins of both lower extremities with inflammation 05/12/2019  Chronic venous insufficiency 05/12/2019   Status post total left knee replacement 09/07/2017   Chronic bilateral thoracic back pain 09/01/2017   Neck pain 09/01/2017   Symptomatic mammary hypertrophy 09/01/2017   Difficult airway for intubation 04/09/2015   Chronic nasal congestion 04/09/2015   Atrial fibrillation ([place]) 09/16/2013   Obstructive sleep apnea 09/16/2013   Morbid obesity ([place]) 09/16/2013   Personal history of breast cancer 02/09/2013   Hypertension    Cancer ([place])     PCP: Perrin Maltese, MD  REFERRING PROVIDER: Melrose Nakayama, MD  REFERRING DIAG: acute pain of right knee  THERAPY DIAG:  Acute pain of right knee  Difficulty in walking, not elsewhere  classified  Muscle weakness (generalized)  Rationale for Evaluation and Treatment Rehabilitation  ONSET DATE: 06/24/2022  SUBJECTIVE:   SUBJECTIVE STATEMENT: Pt reports RLE has buckled with walking 3-4x since surgery. Denies falls. 3/10 NPS currently. Reports having N/T in R thigh that MD believes to be lumbar related. Prednisone taper has not been helpful with possible radicular symptoms.  PERTINENT HISTORY: Pt is s/p R knee arthroscopy on 06/24/22 by Dr. Rhona Raider and was DC home with RW. Pt to attend 3 PT visits prior to surgery for conservative management.   Pt arrives to OPPT with c/o R knee and R thigh pain (thigh>knee) secondary to recent procedure. Since procedure, pt notes she's having increased pain levels, and is managing pain with over the counter medication. Pt currently stating that she does not take stronger prescription medication as she is still driving. Pt notes she ambulates with hurry cane at home. Since procedure, pt notes increased difficulty with: prolonged walking/standing, car transfers, tub transfers, LB dressing, stairs, bed mobility, driving, cooking, cleaning, grocery shopping. Pt states that due to N/T radiating into R foot she is unable to feel break/pedal, and is considering having family assist with transportation.   PAIN:  Are you having pain? Yes: NPRS scale: 3/10 Pain location: R thigh Pain description: dull, achey, burning  PRECAUTIONS: Fall  WEIGHT BEARING RESTRICTIONS No  FALLS:  Has patient fallen in last 6 months? Yes. 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 PROCEDURE:  Right knee abrasion, chondroplasty of patellofemoral.  PATIENT SURVEYS:  FOTO 29%  COGNITION:  Overall cognitive status: Within functional limits for tasks assessed     SENSATION: TTP along R lateral thigh, anterior knee, joint space, and medial/lateral knee  EDEMA:  Circumferential: 50cm (R knee), 70cm (R thigh)   POSTURE: rounded shoulders and forward head  PALPATION: Increased edema noted to R thigh and knee; TTP along lateral thigh and knee. Unable to tolerate patellar mobilizations secondary to pain/edema.  LOWER EXTREMITY ROM:  Active ROM Right eval Left eval  Hip flexion 40deg WFL  Hip extension    Hip abduction  [facility]  Hip adduction    Hip internal rotation  [facility]  Hip external rotation  [facility]  Knee flexion 30deg WFL  Knee extension 0deg [facility]  Ankle dorsiflexion WFL   Ankle plantarflexion WFL   Ankle inversion    Ankle  eversion     (Blank rows = not tested)  LOWER EXTREMITY MMT:  MMT Right eval Left eval  Hip flexion 2-   Hip extension    Hip abduction    Hip adduction    Hip internal rotation    Hip external rotation    Knee flexion 3-   Knee extension 3-   Ankle dorsiflexion    Ankle plantarflexion    Ankle inversion    Ankle eversion     (Blank rows = not tested)   FUNCTIONAL TESTS:  5 times sit to stand: 36sec, demonstrating increased R hip ABD to avoid R knee flexion with descent. Increased time/effort for standing. BUE for support on arm rests.  GAIT: Distance walked: 25f x2 Assistive device utilized: Single point cane Level of assistance: SBA Comments: 3 point gait pattern; decreased R stance, R foot clearance, L step length, and RUE swing    TODAY'S TREATMENT: Therapeutic Exercise: 07/14/22   Supine SAQ on RLE on bolster 3x6. Significant difficulty with noted quad lag   Seated knee flexion on towel. AAROM with towel for assist.   Quad  set 3x10, 5 sec hold  Patellar joint mobs Sup/Inf/medial/lateral: grade 3 for improved knee flexion/extension, x6 with 5 sec bouts/direction.     RLE SLR: 3x6. VC's for form/technique.     L side lying R hip abduction: 3x10. Mod VC's for form/technique with exercise and improved glut med activation  PATIENT EDUCATION:  Education details: form/technique with exercise. Person educated: Patient Education method: Explanation, Demonstration, Tactile cues, Verbal cues, and Handouts Education comprehension: verbalized understanding   HOME EXERCISE PROGRAM: RLE heel slides, quad sets, SAQ, 3x10  Access Code: [email or web address]: [email or web address] Date: 07/14/2022 Prepared by: MLarna Daughters Exercises - Supine Active Straight Leg Raise  - 1 x daily - 5-7 x weekly - 3 sets - 6 reps - Sidelying Hip Abduction  - 1 x daily - 5-7 x weekly - 3 sets - 6 reps   ASSESSMENT:  CLINICAL IMPRESSION: Pt tolerating progression of RLE strengthening today demo'ing ability to perform RLE SLR. Minor quad lag appreciated with SAQ and SLR on RLE. Adequate hamstring mobility noted for R knee TKE thus indicative of expected quad weakness. Education provided on quad and lateral hip strengthening to improve needed strength for gait tasks to prevent R knee buckling. Pt understanding and highly motivated to improve. Pt will continue to benefit from skilled OPPT services to address deficits, improve functional limitations, and improve overall quality of life.    OBJECTIVE IMPAIRMENTS Abnormal gait, decreased activity tolerance, decreased balance, decreased endurance, decreased mobility, difficulty walking, decreased ROM, decreased strength, increased edema, impaired perceived functional ability, impaired flexibility, improper body mechanics, postural dysfunction, and pain.   ACTIVITY LIMITATIONS lifting, bending, sitting, standing, squatting, sleeping, stairs, transfers, bed mobility, dressing, locomotion  level, and car transfers, bed mobility  PARTICIPATION LIMITATIONS: meal prep, cleaning, laundry, driving, shopping, community activity, and occupation  PERSONAL FACTORS Age and 3+ comorbidities: HTN, hx CA, CHF  are also affecting patient's functional outcome.   REHAB POTENTIAL: Good  CLINICAL DECISION MAKING: Stable/uncomplicated  EVALUATION COMPLEXITY: Low   GOALS: Goals reviewed with patient? Yes  SHORT TERM GOALS: Target date: 08/04/2022  Pt will be independent and compliant with HEP in order to decrease knee pain and increase strength in order to improve pain-free function at home and work. Baseline: HEP provided Goal status: INITIAL   LONG TERM GOALS: Target date: 08/25/2022   Patient will increase FOTO score to  equal to or greater than 71% to demonstrate statistically significant improvement in mobility and quality of life. Baseline: 61% Goal status: INITIAL  2.  Patient will report a worst pain of 1-2/10 on VAS in R knee to improve tolerance with ADLs and reduced symptoms with activities. Baseline: 6-7/10 Goal status: INITIAL  3.  Pt will be able to complete 5x STS in </= 11sec indicating decreased risk of falls per this outcome measure based on pt appropriate age group. Baseline: 36sec Goal status: INITIAL  4.  Pt will demonstrate grossly 4+/5 RLE strength for improved independence with ADL's, IADL's, and functional mobility. Baseline: 2-/5 to 3-/5 Goal status: INITIAL    PLAN: PT FREQUENCY: 1-2x/week  PT DURATION: 6 weeks  PLANNED INTERVENTIONS: Therapeutic exercises, Therapeutic activity, Neuromuscular re-education, Balance training, Gait training, Patient/Family education, Self Care, Joint mobilization, Joint manipulation, Stair training, DME instructions, Dry Needling, Electrical stimulation, Cryotherapy, Moist heat, scar mobilization, Traction, Ultrasound, Ionotophoresis '4mg'$ /ml Dexamethasone, Manual therapy, and Re-evaluation  PLAN FOR NEXT SESSION: R knee  strengthening and flexion AROM. May benefit from NMES for R quad strengthening.     Salem Caster. Fairly IV, PT, DPT Physical Therapist- [facility]  12:05 PM,07/14/22

## 2022-07-16 ENCOUNTER — Ambulatory Visit: Payer: PRIVATE HEALTH INSURANCE | Admitting: Physical Therapy

## 2022-07-16 ENCOUNTER — Ambulatory Visit: Payer: PRIVATE HEALTH INSURANCE | Attending: Orthopaedic Surgery | Admitting: Physical Therapy

## 2022-07-16 ENCOUNTER — Encounter: Payer: Self-pay | Admitting: Physical Therapy

## 2022-07-16 DIAGNOSIS — M6281 Muscle weakness (generalized): Secondary | ICD-10-CM | POA: Diagnosis present

## 2022-07-16 DIAGNOSIS — R262 Difficulty in walking, not elsewhere classified: Secondary | ICD-10-CM | POA: Insufficient documentation

## 2022-07-16 DIAGNOSIS — M25561 Pain in right knee: Secondary | ICD-10-CM | POA: Diagnosis present

## 2022-07-16 NOTE — Therapy (Signed)
OUTPATIENT PHYSICAL THERAPY LOWER EXTREMITY TREATMENT   Patient Name: Mandy Roberts MRN: [id number] DOB:Dec 10, 1960, 61 y.o., female Today's Date: 07/16/2022   PT End of Session - 07/16/22 1350     Visit Number 3    Number of Visits 9   eval + 8 visits   Date for PT Re-Evaluation 07/23/22    Authorization Type 2/9    Authorization Time Period 9 visits ( including eval) authorized initially with workman's comp    Authorization - Visit Number 3    Authorization - Number of Visits 9    PT Start Time 4580    PT Stop Time 1430    PT Time Calculation (min) 41 min    Activity Tolerance Patient limited by pain;Patient tolerated treatment well    Behavior During Therapy [facility] for tasks assessed/performed              Past Medical History:  Diagnosis Date   Atrial fibrillation ([place])    Breast cancer ([place]) 1990/1997    left mastectomy ,had recurrence in left suprclavic nodes several yrs ago-treated with radiation and chemo   CHF (congestive heart failure) ([place])    Colon polyp 9983   Complication of anesthesia    Difficult intubation    expectorated blood 3-4 days after surgery in 2011   Difficult intubation    hypoxia with endoscopy in 2016   GERD (gastroesophageal reflux disease)    History of chemotherapy    5-FU/AC in 1990 under the care of Dr. Oliva Bustard. pt also tx with tamoxifen and arimidex   History of radiation therapy    radiation treat at [facility]   Hypertension 2008   Personal history of chemotherapy 1990   BREAST CA   Personal history of malignant neoplasm of breast 1990    left mastectomy    Personal history of radiation therapy 1997   BREAST CA   Sinusitis    Sleep apnea    history of, went away after gastric surgery   Past Surgical History:  Procedure Laterality Date   [place]   COLONOSCOPY  2008, 2015   Dr. Jamal Collin   COLONOSCOPY WITH PROPOFOL N/A 10/12/2019   Procedure: COLONOSCOPY WITH PROPOFOL;  Surgeon: Robert Bellow, MD;  Location: [facility] ENDOSCOPY;  Service: Endoscopy;  Laterality: N/A;  DR BYRNETT WILL UPDATE H&P ON PROCEDURE DATE   DILATION AND CURETTAGE OF UTERUS     KNEE ARTHROPLASTY Left 09/07/2017   Procedure: COMPUTER ASSISTED TOTAL KNEE ARTHROPLASTY;  Surgeon: Dereck Leep, MD;  Location: [facility] ORS;  Service: Orthopedics;  Laterality: Left;   KNEE ARTHROSCOPY Bilateral    KNEE ARTHROSCOPY Right 06/24/2022   Procedure: RIGHT KNEE ARTHROSCOPY;  Surgeon: Melrose Nakayama, MD;  Location: [facility] ORS;  Service: Orthopedics;  Laterality: Right;   KNEE SURGERY Left 2009, 2010, 2012   LAPAROSCOPIC GASTRIC RESTRICTIVE DUODENAL PROCEDURE (DUODENAL SWITCH) N/A 04/17/2015   Procedure: LAPAROSCOPIC GASTRIC RESTRICTIVE DUODENAL PROCEDURE, single anastamosis ;  Surgeon: Bonner Puna, MD;  Location: [facility] ORS;  Service: General;  Laterality: N/A;   MASTECTOMY Left 1990   BREAST CA   TONSILLECTOMY     UPPER GI ENDOSCOPY N/A 04/17/2015   Procedure: UPPER GI ENDOSCOPY;  Surgeon: Bonner Puna, MD;  Location: [facility] ORS;  Service: General;  Laterality: N/A;   Patient Active Problem List   Diagnosis Date Noted   Dental abscess 05/27/2020   Abscess of mandible_left 05/26/2020   Thrombocytopenia ([place]) 05/26/2020  Facial cellulitis_left 05/26/2020   Varicose veins of both lower extremities with inflammation 05/12/2019   Chronic venous insufficiency 05/12/2019   Status post total left knee replacement 09/07/2017   Chronic bilateral thoracic back pain 09/01/2017   Neck pain 09/01/2017   Symptomatic mammary hypertrophy 09/01/2017   Difficult airway for intubation 04/09/2015   Chronic nasal congestion 04/09/2015   Atrial fibrillation ([place]) 09/16/2013   Obstructive sleep apnea 09/16/2013   Morbid obesity ([place]) 09/16/2013   Personal history of breast cancer 02/09/2013   Hypertension    Cancer ([place])     PCP: Perrin Maltese, MD  REFERRING PROVIDER: Melrose Nakayama, MD  REFERRING DIAG: acute pain of right  knee  THERAPY DIAG:  Acute pain of right knee  Difficulty in walking, not elsewhere classified  Muscle weakness (generalized)  Rationale for Evaluation and Treatment Rehabilitation  ONSET DATE: 06/24/2022  SUBJECTIVE:   SUBJECTIVE STATEMENT: Patient reports continued pain in her right lower extremity following knee arthroscopy procedure.  Patient reports her right thigh pain and pain along her lower leg are much higher than her knee pain. PERTINENT HISTORY: Pt is s/p R knee arthroscopy on 06/24/22 by Dr. Rhona Raider and was DC home with RW. Pt to attend 3 PT visits prior to surgery for conservative management.   Pt arrives to [facility]PPT with c/o R knee and R thigh pain (thigh>knee) secondary to recent procedure. Since procedure, pt notes she's having increased pain levels, and is managing pain with over the counter medication. Pt currently stating that she does not take stronger prescription medication as she is still driving. Pt notes she ambulates with hurry cane at home. Since procedure, pt notes increased difficulty with: prolonged walking/standing, car transfers, tub transfers, LB dressing, stairs, bed mobility, driving, cooking, cleaning, grocery shopping. Pt states that due to N/T radiating into R foot she is unable to feel break/pedal, and is considering having family assist with transportation.   PAIN:  Are you having pain? Yes: NPRS scale: 3/10 Pain location: R thigh and right lower leg Pain description: dull, achey, burning  PRECAUTIONS: Fall  WEIGHT BEARING RESTRICTIONS No  FALLS:  Has patient fallen in last 6 months? Yes. 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 PROCEDURE:  Right knee abrasion, chondroplasty of patellofemoral.  PATIENT SURVEYS:  FOTO 29%  COGNITION:  Overall cognitive status: Within functional limits for tasks assessed     SENSATION: TTP along R lateral thigh, anterior knee, joint space, and medial/lateral knee  EDEMA:  Circumferential: 50cm (R knee), 70cm (R thigh)   POSTURE: rounded shoulders and forward head  PALPATION: Increased edema noted to R thigh and knee; TTP along lateral thigh and knee. Unable to tolerate patellar mobilizations secondary to pain/edema.  LOWER EXTREMITY ROM:  Active ROM Right eval Left eval  Hip flexion 40deg WFL  Hip extension    Hip abduction  [facility]  Hip adduction    Hip internal rotation  [facility]  Hip external rotation  [facility]  Knee flexion 30deg WFL  Knee extension 0deg [facility]  Ankle dorsiflexion  WFL   Ankle plantarflexion WFL   Ankle inversion    Ankle eversion     (Blank rows = not tested)  LOWER EXTREMITY MMT:  MMT Right eval Left eval  Hip flexion 2-   Hip extension    Hip abduction    Hip adduction    Hip internal rotation    Hip external rotation    Knee flexion 3-   Knee extension 3-   Ankle dorsiflexion    Ankle plantarflexion    Ankle inversion    Ankle eversion     (Blank rows = not tested)   FUNCTIONAL TESTS:  5 times sit to stand: 36sec, demonstrating increased R hip ABD to avoid R knee flexion with descent. Increased time/effort for standing. BUE for support on arm rests.  GAIT: Distance walked: 44f x2 Assistive device utilized: Single point cane Level of assistance: SBA Comments: 3 point gait pattern; decreased R stance, R foot clearance, L step length, and RUE swing    TODAY'S TREATMENT: 07/16/22 Manual therapy  Patellar joint mobs Sup/Inf/medial/lateral: grade 3 for improved knee  flexion/extension, x6 with 5 sec bouts/direction.  R vastus medialis STM to address pain increased levels in this area  IASTM to hamstring and gastrocnemius muscle patient reports significant pain relief with this activity.  Multiple trigger points noted in gastrocnemius and hamstring addressed with ischemic trigger point release. Therapeutic Exercise:  NuStep level 1 lower extremity only x5 minutes assistance with setting up getting lower extremities and proper positioning for completion Supine SAQ on RLE on bolster 3x6. Significant difficulty with noted quad lag   Supine  knee flexion on towel. AAROM with towel for assist.  Quad set 3x10, 5 sec hold  PATIENT EDUCATION:  Education details: form/technique with exercise. Person educated: Patient Education method: Explanation, Demonstration, Tactile cues, Verbal cues, and Handouts Education comprehension: verbalized understanding   HOME EXERCISE PROGRAM: RLE heel slides, quad sets, SAQ, 3x10  Access Code: [email or web address]: [email or web address] Date: 07/14/2022 Prepared by: MLarna Daughters Exercises - Supine Active Straight Leg Raise  - 1 x daily - 5-7 x weekly - 3 sets - 6 reps - Sidelying Hip Abduction  - 1 x daily - 5-7 x weekly - 3 sets - 6 reps   ASSESSMENT:  CLINICAL IMPRESSION:Continued with current plan of care as laid out in evaluation and recent prior sessions. Pt remains motivated to advance progress toward goals in order to maximize independence and safety at home. Pt requires high level assistance and cuing for completion of exercises in order to provide adequate level of stimulation challenge while minimizing pain and discomfort when possible. Pt closely monitored throughout session pt response and to maximize patient safety during interventions.  Patient responded well to manual therapy particular with the instrument assisted soft tissue mobilization to right hamstring and gastrocnemius musculature with multiple  trigger points noted in each of these muscle groups.  Improved right knee flexion range of motion this session as well as right knee extension with short arc quad activity.  Pt continues to demonstrate progress toward goals AEB progression of interventions this date either in volume or intensity.   Note: Portions of this document were prepared using Dragon voice recognition software and although reviewed may contain unintentional dictation errors in syntax, grammar, or spelling.    OBJECTIVE IMPAIRMENTS Abnormal gait, decreased activity tolerance, decreased balance, decreased endurance, decreased mobility, difficulty walking, decreased ROM, decreased strength, increased edema, impaired perceived functional ability, impaired flexibility, improper body mechanics, postural dysfunction, and pain.  ACTIVITY LIMITATIONS lifting, bending, sitting, standing, squatting, sleeping, stairs, transfers, bed mobility, dressing, locomotion level, and car transfers, bed mobility  PARTICIPATION LIMITATIONS: meal prep, cleaning, laundry, driving, shopping, community activity, and occupation  PERSONAL FACTORS Age and 3+ comorbidities: HTN, hx CA, CHF  are also affecting patient's functional outcome.   REHAB POTENTIAL: Good  CLINICAL DECISION MAKING: Stable/uncomplicated  EVALUATION COMPLEXITY: Low   GOALS: Goals reviewed with patient? Yes  SHORT TERM GOALS: Target date: 08/06/2022  Pt will be independent and compliant with HEP in order to decrease knee pain and increase strength in order to improve pain-free function at home and work. Baseline: HEP provided Goal status: INITIAL   LONG TERM GOALS: Target date: 08/27/2022   Patient will increase FOTO score to equal to or greater than 71% to demonstrate statistically significant improvement in mobility and quality of life. Baseline: 61% Goal status: INITIAL  2.  Patient will report a worst pain of 1-2/10 on VAS in R knee to improve tolerance with ADLs  and reduced symptoms with activities. Baseline: 6-7/10 Goal status: INITIAL  3.  Pt will be able to complete 5x STS in </= 11sec indicating decreased risk of falls per this outcome measure based on pt appropriate age group. Baseline: 36sec Goal status: INITIAL  4.  Pt will demonstrate grossly 4+/5 RLE strength for improved independence with ADL's, IADL's, and functional mobility. Baseline: 2-/5 to 3-/5 Goal status: INITIAL    PLAN: PT FREQUENCY: 1-2x/week  PT DURATION: 6 weeks  PLANNED INTERVENTIONS: Therapeutic exercises, Therapeutic activity, Neuromuscular re-education, Balance training, Gait training, Patient/Family education, Self Care, Joint mobilization, Joint manipulation, Stair training, DME instructions, Dry Needling, Electrical stimulation, Cryotherapy, Moist heat, scar mobilization, Traction, Ultrasound, Ionotophoresis '4mg'$ /ml Dexamethasone, Manual therapy, and Re-evaluation  PLAN FOR NEXT SESSION: R knee strengthening and flexion AROM. May benefit from NMES for R quad strengthening.    Particia Lather PT  4:14 PM,07/16/22

## 2022-07-21 ENCOUNTER — Ambulatory Visit: Payer: PRIVATE HEALTH INSURANCE

## 2022-07-21 DIAGNOSIS — M25561 Pain in right knee: Secondary | ICD-10-CM | POA: Diagnosis not present

## 2022-07-21 DIAGNOSIS — R262 Difficulty in walking, not elsewhere classified: Secondary | ICD-10-CM

## 2022-07-21 DIAGNOSIS — M6281 Muscle weakness (generalized): Secondary | ICD-10-CM

## 2022-07-21 NOTE — Therapy (Signed)
OUTPATIENT PHYSICAL THERAPY LOWER EXTREMITY TREATMENT   Patient Name: Mandy Roberts MRN: [id number] DOB:1961-05-17, 61 y.o., female Today's Date: 07/21/2022   PT End of Session - 07/21/22 0851     Visit Number 4    Number of Visits 9   eval + 8 visits   Date for PT Re-Evaluation 07/23/22    Authorization Type 2/9    Authorization Time Period 9 visits ( including eval) authorized initially with workman's comp    Authorization - Visit Number 3    Authorization - Number of Visits 9    PT Start Time 0807    PT Stop Time 0846    PT Time Calculation (min) 39 min    Activity Tolerance Patient limited by pain;Patient tolerated treatment well    Behavior During Therapy [facility] for tasks assessed/performed               Past Medical History:  Diagnosis Date   Atrial fibrillation ([place])    Breast cancer ([place]) 1990/1997    left mastectomy ,had recurrence in left suprclavic nodes several yrs ago-treated with radiation and chemo   CHF (congestive heart failure) ([place])    Colon polyp [id number]   Complication of anesthesia    Difficult intubation    expectorated blood 3-4 days after surgery in 2011   Difficult intubation    hypoxia with endoscopy in 2016   GERD (gastroesophageal reflux disease)    History of chemotherapy    5-FU/AC in 1990 under the care of Dr. Oliva Bustard. pt also tx with tamoxifen and arimidex   History of radiation therapy    radiation treat at [facility]   Hypertension 2008   Personal history of chemotherapy 1990   BREAST CA   Personal history of malignant neoplasm of breast 1990    left mastectomy    Personal history of radiation therapy 1997   BREAST CA   Sinusitis    Sleep apnea    history of, went away after gastric surgery   Past Surgical History:  Procedure Laterality Date   [place]   COLONOSCOPY  2008, 2015   Dr. Jamal Collin   COLONOSCOPY WITH PROPOFOL N/A 10/12/2019   Procedure: COLONOSCOPY WITH PROPOFOL;  Surgeon: Robert Bellow, MD;  Location: [facility] ENDOSCOPY;  Service: Endoscopy;  Laterality: N/A;  DR BYRNETT WILL UPDATE H&P ON PROCEDURE DATE   DILATION AND CURETTAGE OF UTERUS     KNEE ARTHROPLASTY Left 09/07/2017   Procedure: COMPUTER ASSISTED TOTAL KNEE ARTHROPLASTY;  Surgeon: Dereck Leep, MD;  Location: [facility] ORS;  Service: Orthopedics;  Laterality: Left;   KNEE ARTHROSCOPY Bilateral    KNEE ARTHROSCOPY Right 06/24/2022   Procedure: RIGHT KNEE ARTHROSCOPY;  Surgeon: Melrose Nakayama, MD;  Location: [facility] ORS;  Service: Orthopedics;  Laterality: Right;   KNEE SURGERY Left 2009, 2010, 2012   LAPAROSCOPIC GASTRIC RESTRICTIVE DUODENAL PROCEDURE (DUODENAL SWITCH) N/A 04/17/2015   Procedure: LAPAROSCOPIC GASTRIC RESTRICTIVE DUODENAL PROCEDURE, single anastamosis ;  Surgeon: Bonner Puna, MD;  Location: [facility] ORS;  Service: General;  Laterality: N/A;   MASTECTOMY Left 1990   BREAST CA   TONSILLECTOMY     UPPER GI ENDOSCOPY N/A 04/17/2015   Procedure: UPPER GI ENDOSCOPY;  Surgeon: Bonner Puna, MD;  Location: [facility] ORS;  Service: General;  Laterality: N/A;   Patient Active Problem List   Diagnosis Date Noted   Dental abscess 05/27/2020   Abscess of mandible_left 05/26/2020   Thrombocytopenia ([place]) 05/26/2020  Facial cellulitis_left 05/26/2020   Varicose veins of both lower extremities with inflammation 05/12/2019   Chronic venous insufficiency 05/12/2019   Status post total left knee replacement 09/07/2017   Chronic bilateral thoracic back pain 09/01/2017   Neck pain 09/01/2017   Symptomatic mammary hypertrophy 09/01/2017   Difficult airway for intubation 04/09/2015   Chronic nasal congestion 04/09/2015   Atrial fibrillation ([place]) 09/16/2013   Obstructive sleep apnea 09/16/2013   Morbid obesity ([place]) 09/16/2013   Personal history of breast cancer 02/09/2013   Hypertension    Cancer ([place])     PCP: Perrin Maltese, MD  REFERRING PROVIDER: Melrose Nakayama, MD  REFERRING DIAG: acute pain of right  knee  THERAPY DIAG:  Acute pain of right knee  Difficulty in walking, not elsewhere classified  Muscle weakness (generalized)  Rationale for Evaluation and Treatment Rehabilitation  ONSET DATE: 06/24/2022  SUBJECTIVE:   SUBJECTIVE STATEMENT: Pt reports coming down the steps and when she stepped her leg buckled and she twisted her R foot. Pt can still WB through foot but it's increasingly sore. Continues to have about 3/10 pain. HEP is going well; continues to be challenging at times. Went to the beach this weekend and was unable to walk on sand as it was too challenging.  PERTINENT HISTORY: Pt is s/p R knee arthroscopy on 06/24/22 by Dr. Rhona Raider and was DC home with RW. Pt to attend 3 PT visits prior to surgery for conservative management.   Pt arrives to OPPT with c/o R knee and R thigh pain (thigh>knee) secondary to recent procedure. Since procedure, pt notes she's having increased pain levels, and is managing pain with over the counter medication. Pt currently stating that she does not take stronger prescription medication as she is still driving. Pt notes she ambulates with hurry cane at home. Since procedure, pt notes increased difficulty with: prolonged walking/standing, car transfers, tub transfers, LB dressing, stairs, bed mobility, driving, cooking, cleaning, grocery shopping. Pt states that due to N/T radiating into R foot she is unable to feel break/pedal, and is considering having family assist with transportation.   PAIN:  Are you having pain? Yes: NPRS scale: 3/10 Pain location: R thigh and foot Pain description: dull/constant ache down leg, burning/tingling (foot)  PRECAUTIONS: Fall  WEIGHT BEARING RESTRICTIONS No  FALLS:  Has patient fallen in last 6 months? Yes. 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 PROCEDURE:  Right knee abrasion, chondroplasty of patellofemoral.  PATIENT SURVEYS:  FOTO 29%  COGNITION:  Overall cognitive status: Within functional limits for tasks assessed     SENSATION: TTP along R lateral thigh, anterior knee, joint space, and medial/lateral knee  EDEMA:  Circumferential: 50cm (R knee), 70cm (R thigh)   POSTURE: rounded shoulders and forward head  PALPATION: Increased edema noted to R thigh and knee; TTP along lateral thigh and knee. Unable to tolerate patellar mobilizations secondary to pain/edema.  LOWER EXTREMITY ROM:  Active ROM Right eval Left eval  Hip flexion 40deg WFL  Hip extension    Hip abduction  WFL  Hip adduction    Hip internal rotation  WFL  Hip external rotation  [facility]  Knee flexion 30deg WFL  Knee extension 0deg [facility]  Ankle dorsiflexion WFL   Ankle plantarflexion WFL   Ankle inversion    Ankle eversion     (Blank rows = not tested)  LOWER EXTREMITY MMT:  MMT Right eval Left eval  Hip flexion 2-   Hip extension    Hip abduction    Hip adduction    Hip internal rotation    Hip external rotation    Knee flexion 3-   Knee extension 3-   Ankle dorsiflexion    Ankle plantarflexion    Ankle inversion    Ankle eversion     (Blank rows = not tested)   FUNCTIONAL TESTS:  5 times sit to stand: 36sec, demonstrating increased R hip ABD to avoid R knee flexion with descent. Increased time/effort for standing. BUE for support on arm rests.  GAIT: Distance walked: 76f x2 Assistive device utilized: Single point cane Level of assistance: SBA Comments: 3 point gait pattern; decreased R stance, R foot clearance, L  step length, and RUE swing    TODAY'S TREATMENT: 07/21/22 Manual therapy  Patellar joint mobs Sup/Inf/medial/lateral: grade 3 for improved knee flexion/extension, x6 with 5 sec bouts/direction.  R vastus medialis/lateralis and proximal M gastroc head STM to address pain increased levels in this area  Therapeutic Exercise:  NuStep level 1 lower extremity only x5 minutes assistance with setting up getting lower extremities and proper positioning for completion. Seat at 9; BUE at 6 Supine SAQ on RLE on bolster 3x6. Significant difficulty with noted quad lag     Seated calf stretch 2x30s RLE - emphasis for straight knee and R weight shift for strong but comfortable stretch  PATIENT EDUCATION:  Education details: form/technique with exercise, heat to thigh for improved mm tone, icing knee for edema/pain management. Person educated: Patient Education method: Explanation, Demonstration, Tactile cues, Verbal cues, and Handouts Education comprehension: verbalized understanding   HOME EXERCISE PROGRAM: RLE heel slides, quad sets, SAQ, 3x10  Access Code: [email or web address]: [email or web address] Date: 07/21/2022 Prepared by: SAmalia Hailey Exercises - Supine Active Straight Leg Raise  - 1 x daily - 5-7 x weekly - 3 sets - 6 reps - Sidelying Hip Abduction  - 1 x daily - 5-7 x weekly - 3 sets - 6 reps - Seated Calf Stretch  - 1 x daily - 7 x weekly - 3 sets - 10 reps   ASSESSMENT:  CLINICAL IMPRESSION: Pt tolerated treatment fair with overall treatment limited secondary to increased R knee pain with extension. Significant improvements in mm tension noted s/p STM, with improved c/o pain. Incorporated seated HS/gastroc stretch to address mm tension which could be contributing to neural tension and c/o burning/tingling in RLE (along medial foot and into 1st-2nd digits). Improved RLE edema and bruising noted. Plan to address potential neural tension at next visit; pt with negative SLR  despite EV/IV bias. Pt will continue to benefit from skilled OPPT services to address deficits for return to baseline function/work.     OBJECTIVE IMPAIRMENTS Abnormal gait, decreased activity tolerance, decreased balance, decreased endurance, decreased mobility, difficulty walking, decreased ROM, decreased strength, increased edema, impaired perceived functional ability, impaired flexibility, improper body mechanics, postural dysfunction, and pain.   ACTIVITY LIMITATIONS lifting, bending, sitting, standing, squatting, sleeping, stairs, transfers, bed mobility, dressing, locomotion level, and car transfers, bed mobility  PARTICIPATION LIMITATIONS: meal prep, cleaning, laundry,  driving, shopping, community activity, and occupation  Deuel Age and 3+ comorbidities: HTN, hx CA, CHF  are also affecting patient's functional outcome.   REHAB POTENTIAL: Good  CLINICAL DECISION MAKING: Stable/uncomplicated  EVALUATION COMPLEXITY: Low   GOALS: Goals reviewed with patient? Yes  SHORT TERM GOALS: Target date: 08/11/2022  Pt will be independent and compliant with HEP in order to decrease knee pain and increase strength in order to improve pain-free function at home and work. Baseline: HEP provided Goal status: INITIAL   LONG TERM GOALS: Target date: 09/01/2022   Patient will increase FOTO score to equal to or greater than 71% to demonstrate statistically significant improvement in mobility and quality of life. Baseline: 61% Goal status: INITIAL  2.  Patient will report a worst pain of 1-2/10 on VAS in R knee to improve tolerance with ADLs and reduced symptoms with activities. Baseline: 6-7/10 Goal status: INITIAL  3.  Pt will be able to complete 5x STS in </= 11sec indicating decreased risk of falls per this outcome measure based on pt appropriate age group. Baseline: 36sec Goal status: INITIAL  4.  Pt will demonstrate grossly 4+/5 RLE strength for improved independence with  ADL's, IADL's, and functional mobility. Baseline: 2-/5 to 3-/5 Goal status: INITIAL    PLAN: PT FREQUENCY: 1-2x/week  PT DURATION: 6 weeks  PLANNED INTERVENTIONS: Therapeutic exercises, Therapeutic activity, Neuromuscular re-education, Balance training, Gait training, Patient/Family education, Self Care, Joint mobilization, Joint manipulation, Stair training, DME instructions, Dry Needling, Electrical stimulation, Cryotherapy, Moist heat, scar mobilization, Traction, Ultrasound, Ionotophoresis '4mg'$ /ml Dexamethasone, Manual therapy, and Re-evaluation  PLAN FOR NEXT SESSION: assess neural tension in the R leg (down into 1st-2nd digit). Seated knee distraction with A/P and IR/ER mobilizations. R knee strengthening and flexion AROM. May benefit from NMES for R quad strengthening.    Herminio Commons, PT, DPT 9:32 AM,07/21/22 Physical Therapist - [facility]

## 2022-07-22 ENCOUNTER — Other Ambulatory Visit: Payer: Self-pay

## 2022-07-22 NOTE — Therapy (Signed)
OUTPATIENT PHYSICAL THERAPY LOWER EXTREMITY TREATMENT/RECERT   Patient Name: Mandy Roberts MRN: [id number] DOB:[birth year], 61 y.o., female Today's Date: 07/22/2022       Past Medical History:  Diagnosis Date   Atrial fibrillation [facility])    Breast cancer [facility]) 1990/1997    left mastectomy ,had recurrence in left suprclavic nodes several yrs ago-treated with radiation and chemo   CHF (congestive heart failure) ([place])    Colon polyp 1443   Complication of anesthesia    Difficult intubation    expectorated blood 3-4 days after surgery in 2011   Difficult intubation    hypoxia with endoscopy in 2016   GERD (gastroesophageal reflux disease)    History of chemotherapy    5-FU/AC in 1990 under the care of Dr. Oliva Bustard. pt also tx with tamoxifen and arimidex   History of radiation therapy    radiation treat at [facility]   Hypertension 2008   Personal history of chemotherapy 1990   BREAST CA   Personal history of malignant neoplasm of breast 1990    left mastectomy    Personal history of radiation therapy 1997   BREAST CA   Sinusitis    Sleep apnea    history of, went away after gastric surgery   Past Surgical History:  Procedure Laterality Date   [place]   COLONOSCOPY  2008, 2015   Dr. Jamal Collin   COLONOSCOPY WITH PROPOFOL N/A 10/12/2019   Procedure: COLONOSCOPY WITH PROPOFOL;  Surgeon: Robert Bellow, MD;  Location: [facility] ENDOSCOPY;  Service: Endoscopy;  Laterality: N/A;  DR BYRNETT WILL UPDATE H&P ON PROCEDURE DATE   DILATION AND CURETTAGE OF UTERUS     KNEE ARTHROPLASTY Left 09/07/2017   Procedure: COMPUTER ASSISTED TOTAL KNEE ARTHROPLASTY;  Surgeon: Dereck Leep, MD;  Location: [facility] ORS;  Service: Orthopedics;  Laterality: Left;   KNEE ARTHROSCOPY Bilateral    KNEE ARTHROSCOPY Right 06/24/2022   Procedure: RIGHT KNEE ARTHROSCOPY;  Surgeon: Melrose Nakayama, MD;  Location: [facility] ORS;  Service: Orthopedics;  Laterality: Right;   KNEE  SURGERY Left 2009, 2010, 2012   LAPAROSCOPIC GASTRIC RESTRICTIVE DUODENAL PROCEDURE (DUODENAL SWITCH) N/A 04/17/2015   Procedure: LAPAROSCOPIC GASTRIC RESTRICTIVE DUODENAL PROCEDURE, single anastamosis ;  Surgeon: Bonner Puna, MD;  Location: [facility] ORS;  Service: General;  Laterality: N/A;   MASTECTOMY Left 1990   BREAST CA   TONSILLECTOMY     UPPER GI ENDOSCOPY N/A 04/17/2015   Procedure: UPPER GI ENDOSCOPY;  Surgeon: Bonner Puna, MD;  Location: [facility] ORS;  Service: General;  Laterality: N/A;   Patient Active Problem List   Diagnosis Date Noted   Dental abscess 05/27/2020   Abscess of mandible_left 05/26/2020   Thrombocytopenia ([place]) 05/26/2020   Facial cellulitis_left 05/26/2020   Varicose veins of both lower extremities with inflammation 05/12/2019   Chronic venous insufficiency 05/12/2019   Status post total left knee replacement 09/07/2017   Chronic bilateral thoracic back pain 09/01/2017   Neck pain 09/01/2017   Symptomatic mammary hypertrophy 09/01/2017   Difficult airway for intubation 04/09/2015   Chronic nasal congestion 04/09/2015   Atrial fibrillation ([place]) 09/16/2013   Obstructive sleep apnea 09/16/2013   Morbid obesity ([place]) 09/16/2013   Personal history of breast cancer 02/09/2013   Hypertension    Cancer ([place])     PCP: Perrin Maltese, MD  REFERRING PROVIDER: Melrose Nakayama, MD  REFERRING DIAG: acute pain of right knee  THERAPY DIAG:  No diagnosis found.  Rationale for Evaluation and Treatment Rehabilitation  ONSET DATE: 06/24/2022  SUBJECTIVE:   SUBJECTIVE STATEMENT: Patient had procedure 06/24/22 and has been having issues with her knee ever since. Has not had a pain free day since surgery.   PERTINENT HISTORY: Pt is s/p R knee arthroscopy on 06/24/22 by Dr. Rhona Raider and was DC home with RW. Pt to attend 3 PT visits prior to surgery for conservative management.   Pt arrives to OPPT with c/o R knee and R thigh pain (thigh>knee) secondary to recent procedure.  Since procedure, pt notes she's having increased pain levels, and is managing pain with over the counter medication. Pt currently stating that she does not take stronger prescription medication as she is still driving. Pt notes she ambulates with hurry cane at home. Since procedure, pt notes increased difficulty with: prolonged walking/standing, car transfers, tub transfers, LB dressing, stairs, bed mobility, driving, cooking, cleaning, grocery shopping. Pt states that due to N/T radiating into R foot she is unable to feel break/pedal, and is considering having family assist with transportation.   PAIN:  Are you having pain? Yes: NPRS scale: 3/10 Pain location: R thigh and foot Pain description: dull/constant ache down leg, burning/tingling (foot)  PRECAUTIONS: Fall  WEIGHT BEARING RESTRICTIONS No  FALLS:  Has patient fallen in last 6 months? Yes. 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 PROCEDURE:  Right knee abrasion, chondroplasty of patellofemoral.  PATIENT SURVEYS:  FOTO 29%  COGNITION:  Overall cognitive status: Within functional limits for tasks assessed     SENSATION: TTP along R lateral thigh, anterior knee, joint space, and medial/lateral knee  EDEMA:  Circumferential: 50cm (R knee), 70cm (R thigh)   POSTURE: rounded shoulders and forward head  PALPATION: Increased edema noted to R thigh and knee; TTP along lateral thigh and knee. Unable to tolerate patellar mobilizations secondary to  pain/edema.  LOWER EXTREMITY ROM:  Active ROM Right eval Left eval  Hip flexion 40deg WFL  Hip extension    Hip abduction  WFL  Hip adduction    Hip internal rotation  WFL  Hip external rotation  [facility]  Knee flexion 30deg WFL  Knee extension 0deg WFL  Ankle dorsiflexion WFL   Ankle plantarflexion WFL   Ankle inversion    Ankle eversion     (Blank rows = not tested)  LOWER EXTREMITY MMT:  MMT Right eval Left eval  Hip flexion 2-   Hip extension    Hip abduction    Hip adduction    Hip internal rotation    Hip external rotation    Knee flexion 3-   Knee extension 3-   Ankle dorsiflexion    Ankle plantarflexion    Ankle inversion    Ankle eversion     (Blank rows = not tested)   FUNCTIONAL TESTS:  5 times sit to stand: 36sec, demonstrating increased R hip ABD to avoid R knee flexion with descent. Increased time/effort for standing. BUE for support on arm rests.  GAIT: Distance walked: 27f x2 Assistive device utilized: Single  point cane Level of assistance: SBA Comments: 3 point gait pattern; decreased R stance, R foot clearance, L step length, and RUE swing    TODAY'S TREATMENT:  Right Left  Hip flexion 4-* 5  Hip Abduction 4- 5  Hip Adduction 4- 5  Knee Extension  3* 5  Knee Flexion 4+  5  DF 4 5  PF 5* 5  pain   Manual therapy  Patellar joint mobs Sup/Inf/medial/lateral: grade 3 for improved knee flexion/extension, x6 with 5 sec bouts/direction.  R vastus medialis/lateralis and proximal M gastroc head rolling stick to address pain increased levels in this area  R anterior tib STM x4 minutes  TherEx: Stair negotiation training Sit to stands  PATIENT EDUCATION:  Education details: form/technique with exercise, heat to thigh for improved mm tone, icing knee for edema/pain management. Person educated: Patient Education method: Explanation, Demonstration, Tactile cues, Verbal cues, and Handouts Education comprehension: verbalized  understanding   HOME EXERCISE PROGRAM: RLE heel slides, quad sets, SAQ, 3x10  Access Code: [email or web address] URL: [email or web address] Date: 07/21/2022 Prepared by: Amalia Hailey  Exercises - Supine Active Straight Leg Raise  - 1 x daily - 5-7 x weekly - 3 sets - 6 reps - Sidelying Hip Abduction  - 1 x daily - 5-7 x weekly - 3 sets - 6 reps - Seated Calf Stretch  - 1 x daily - 7 x weekly - 3 sets - 10 reps   ASSESSMENT:  CLINICAL IMPRESSION:   Patient's goals performed this session. Patient has had surgery since evaluation testing date and has had multiple complications. Patient recommended to follow up with specialist as post surgical limb is not progressing as a standard patient's limb would in addition to complications with bruising and nerve impingement symptoms. New goal addressing stair negotiation added to POC as patient has impaired safety with step negotiation. Her ambulation is limited by intermittent knee buckling resulting in LOB. Her strength is improving but continues to be an area of improvement. Pt will continue to benefit from skilled OPPT services to address deficits for return to baseline function/work.     OBJECTIVE IMPAIRMENTS Abnormal gait, decreased activity tolerance, decreased balance, decreased endurance, decreased mobility, difficulty walking, decreased ROM, decreased strength, increased edema, impaired perceived functional ability, impaired flexibility, improper body mechanics, postural dysfunction, and pain.   ACTIVITY LIMITATIONS lifting, bending, sitting, standing, squatting, sleeping, stairs, transfers, bed mobility, dressing, locomotion level, and car transfers, bed mobility  PARTICIPATION LIMITATIONS: meal prep, cleaning, laundry, driving, shopping, community activity, and occupation  PERSONAL FACTORS Age and 3+ comorbidities: HTN, hx CA, CHF  are also affecting patient's functional outcome.   REHAB POTENTIAL: Good  CLINICAL DECISION MAKING:  Stable/uncomplicated  EVALUATION COMPLEXITY: Low   GOALS: Goals reviewed with patient? Yes  SHORT TERM GOALS: Target date: 08/12/2022  Pt will be independent and compliant with HEP in order to decrease knee pain and increase strength in order to improve pain-free function at home and work. Baseline: HEP provided Goal status: INITIAL   LONG TERM GOALS: Target date: 09/03/2022     Patient will increase FOTO score to equal to or greater than 71% to demonstrate statistically significant improvement in mobility and quality of life. Baseline: 61% 8/30: 49%  Goal status: IN PROGRESS  2.  Patient will report a worst pain of 1-2/10 on VAS in R knee to improve tolerance with ADLs and reduced symptoms with activities. Baseline: 6-7/10; 8/30: 7/10 thigh pain and foot numb  Goal status: IN PROGRESS  3.  Pt will be able to complete 5x STS in </= 11sec indicating decreased risk of falls per this outcome measure based on pt appropriate age group. Baseline: 36sec 8/30: 32 seconds no UE support  Goal status: IN PROGRESS  4.  Pt will demonstrate grossly 4+/5 RLE strength for improved independence with ADL's, IADL's, and functional mobility. Baseline: 2-/5 to 3-/5 8/30: see note Goal status: In Progress   4.   Patient will be independent with ascend/descend 5 steps using single UE in step over step pattern without LOB. Baseline: step to pattern with BUE support  Goal status: INITIAL  PLAN: PT FREQUENCY: 1-2x/week  PT DURATION: 6 weeks  PLANNED INTERVENTIONS: Therapeutic exercises, Therapeutic activity, Neuromuscular re-education, Balance training, Gait training, Patient/Family education, Self Care, Joint mobilization, Joint manipulation, Stair training, DME instructions, Dry Needling, Electrical stimulation, Cryotherapy, Moist heat, scar mobilization, Traction, Ultrasound, Ionotophoresis '4mg'$ /ml Dexamethasone, Manual therapy, and Re-evaluation  PLAN FOR NEXT SESSION: assess neural tension in  the R leg (down into 1st-2nd digit). Seated knee distraction with A/P and IR/ER mobilizations. R knee strengthening and flexion AROM. May benefit from NMES for R quad strengthening.    Janna Arch PT  4:16 PM,07/22/22 Physical Therapist - [facility]

## 2022-07-23 ENCOUNTER — Ambulatory Visit: Payer: PRIVATE HEALTH INSURANCE

## 2022-07-23 DIAGNOSIS — M25561 Pain in right knee: Secondary | ICD-10-CM | POA: Diagnosis not present

## 2022-07-23 DIAGNOSIS — M6281 Muscle weakness (generalized): Secondary | ICD-10-CM

## 2022-07-23 DIAGNOSIS — R262 Difficulty in walking, not elsewhere classified: Secondary | ICD-10-CM

## 2022-07-24 ENCOUNTER — Other Ambulatory Visit: Payer: Self-pay

## 2022-07-24 DIAGNOSIS — R7302 Impaired glucose tolerance (oral): Secondary | ICD-10-CM | POA: Diagnosis not present

## 2022-07-24 DIAGNOSIS — I429 Cardiomyopathy, unspecified: Secondary | ICD-10-CM | POA: Diagnosis not present

## 2022-07-24 DIAGNOSIS — E559 Vitamin D deficiency, unspecified: Secondary | ICD-10-CM | POA: Diagnosis not present

## 2022-07-24 DIAGNOSIS — E668 Other obesity: Secondary | ICD-10-CM | POA: Diagnosis not present

## 2022-07-24 MED ORDER — WEGOVY 2.4 MG/0.75ML ~~LOC~~ SOAJ
SUBCUTANEOUS | 3 refills | Status: DC
  Filled 2022-07-24: qty 3, 28d supply, fill #0
  Filled 2022-08-28: qty 3, 28d supply, fill #1
  Filled 2022-09-21: qty 3, 28d supply, fill #2
  Filled 2022-10-13 – 2022-10-21 (×2): qty 3, 28d supply, fill #3

## 2022-07-25 ENCOUNTER — Ambulatory Visit: Payer: PRIVATE HEALTH INSURANCE | Attending: Orthopaedic Surgery | Admitting: Physical Therapy

## 2022-07-25 ENCOUNTER — Encounter: Payer: Self-pay | Admitting: Physical Therapy

## 2022-07-25 DIAGNOSIS — M6281 Muscle weakness (generalized): Secondary | ICD-10-CM | POA: Diagnosis present

## 2022-07-25 DIAGNOSIS — M25561 Pain in right knee: Secondary | ICD-10-CM | POA: Diagnosis not present

## 2022-07-25 DIAGNOSIS — R262 Difficulty in walking, not elsewhere classified: Secondary | ICD-10-CM | POA: Diagnosis present

## 2022-07-25 NOTE — Therapy (Signed)
OUTPATIENT PHYSICAL THERAPY LOWER EXTREMITY TREATMENT   Patient Name: Mandy Roberts MRN: [id number] DOB:12/24/1960, 61 y.o., female Today's Date: 07/25/2022   PT End of Session - 07/25/22 1035     Visit Number 6    Number of Visits 17   eval + 8 visits   Date for PT Re-Evaluation 09/03/22    Authorization Type 5/9    Authorization Time Period 9 visits ( including eval) authorized initially with workman's comp    Authorization - Visit Number 4    Authorization - Number of Visits 9    PT Start Time 0258    PT Stop Time 1115    PT Time Calculation (min) 43 min    Activity Tolerance Patient limited by pain;Patient tolerated treatment well    Behavior During Therapy [facility] for tasks assessed/performed                Past Medical History:  Diagnosis Date   Atrial fibrillation ([place])    Breast cancer ([place]) 1990/1997    left mastectomy ,had recurrence in left suprclavic nodes several yrs ago-treated with radiation and chemo   CHF (congestive heart failure) ([place])    Colon polyp [id number]   Complication of anesthesia    Difficult intubation    expectorated blood 3-4 days after surgery in 2011   Difficult intubation    hypoxia with endoscopy in 2016   GERD (gastroesophageal reflux disease)    History of chemotherapy    5-FU/AC in 1990 under the care of Dr. Oliva Bustard. pt also tx with tamoxifen and arimidex   History of radiation therapy    radiation treat at [facility]   Hypertension 2008   Personal history of chemotherapy 1990   BREAST CA   Personal history of malignant neoplasm of breast 1990    left mastectomy    Personal history of radiation therapy 1997   BREAST CA   Sinusitis    Sleep apnea    history of, went away after gastric surgery   Past Surgical History:  Procedure Laterality Date   [place]   COLONOSCOPY  2008, 2015   Dr. Jamal Collin   COLONOSCOPY WITH PROPOFOL N/A 10/12/2019   Procedure: COLONOSCOPY WITH PROPOFOL;  Surgeon:  Robert Bellow, MD;  Location: [facility] ENDOSCOPY;  Service: Endoscopy;  Laterality: N/A;  DR BYRNETT WILL UPDATE H&P ON PROCEDURE DATE   DILATION AND CURETTAGE OF UTERUS     KNEE ARTHROPLASTY Left 09/07/2017   Procedure: COMPUTER ASSISTED TOTAL KNEE ARTHROPLASTY;  Surgeon: Dereck Leep, MD;  Location: [facility] ORS;  Service: Orthopedics;  Laterality: Left;   KNEE ARTHROSCOPY Bilateral    KNEE ARTHROSCOPY Right 06/24/2022   Procedure: RIGHT KNEE ARTHROSCOPY;  Surgeon: Melrose Nakayama, MD;  Location: [facility] ORS;  Service: Orthopedics;  Laterality: Right;   KNEE SURGERY Left 2009, 2010, 2012   LAPAROSCOPIC GASTRIC RESTRICTIVE DUODENAL PROCEDURE (DUODENAL SWITCH) N/A 04/17/2015   Procedure: LAPAROSCOPIC GASTRIC RESTRICTIVE DUODENAL PROCEDURE, single anastamosis ;  Surgeon: Bonner Puna, MD;  Location: [facility] ORS;  Service: General;  Laterality: N/A;   MASTECTOMY Left 1990   BREAST CA   TONSILLECTOMY     UPPER GI ENDOSCOPY N/A 04/17/2015   Procedure: UPPER GI ENDOSCOPY;  Surgeon: Bonner Puna, MD;  Location: [facility] ORS;  Service: General;  Laterality: N/A;   Patient Active Problem List   Diagnosis Date Noted   Dental abscess 05/27/2020   Abscess of mandible_left 05/26/2020   Thrombocytopenia ([place])  05/26/2020   Facial cellulitis_left 05/26/2020   Varicose veins of both lower extremities with inflammation 05/12/2019   Chronic venous insufficiency 05/12/2019   Status post total left knee replacement 09/07/2017   Chronic bilateral thoracic back pain 09/01/2017   Neck pain 09/01/2017   Symptomatic mammary hypertrophy 09/01/2017   Difficult airway for intubation 04/09/2015   Chronic nasal congestion 04/09/2015   Atrial fibrillation ([place]) 09/16/2013   Obstructive sleep apnea 09/16/2013   Morbid obesity ([place]) 09/16/2013   Personal history of breast cancer 02/09/2013   Hypertension    Cancer ([place])     PCP: Perrin Maltese, MD  REFERRING PROVIDER: Melrose Nakayama, MD  REFERRING DIAG: acute pain of right  knee  THERAPY DIAG:  Acute pain of right knee  Difficulty in walking, not elsewhere classified  Muscle weakness (generalized)  Rationale for Evaluation and Treatment Rehabilitation  ONSET DATE: 06/24/2022  SUBJECTIVE:   SUBJECTIVE STATEMENT: Pt reports continue dpain and dysfunction in her R LE. Pt has s/s of nerve involvement AEB numbness in her foot on plantar aspect.  PERTINENT HISTORY:  Pt is s/p R knee arthroscopy on 06/24/22 by Dr. Rhona Raider and was DC home with RW. Pt to attend 3 PT visits prior to surgery for conservative management.   Pt arrives to OPPT with c/o R knee and R thigh pain (thigh>knee) secondary to recent procedure. Since procedure, pt notes she's having increased pain levels, and is managing pain with over the counter medication. Pt currently stating that she does not take stronger prescription medication as she is still driving. Pt notes she ambulates with hurry cane at home. Since procedure, pt notes increased difficulty with: prolonged walking/standing, car transfers, tub transfers, LB dressing, stairs, bed mobility, driving, cooking, cleaning, grocery shopping. Pt states that due to N/T radiating into R foot she is unable to feel break/pedal, and is considering having family assist with transportation.   PAIN:  Are you having pain? Yes: NPRS scale: 3/10 Pain location: R thigh and foot Pain description: dull/constant ache down leg, burning/tingling (foot)  PRECAUTIONS: Fall  WEIGHT BEARING RESTRICTIONS No  FALLS:  Has patient fallen in last 6 months? Yes. 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 PROCEDURE:  Right knee abrasion, chondroplasty of patellofemoral.  PATIENT SURVEYS:  FOTO 29%  COGNITION:  Overall cognitive status: Within functional limits for tasks assessed     SENSATION: TTP along R lateral thigh, anterior knee, joint space, and medial/lateral knee  EDEMA:  Circumferential: 50cm (R knee), 70cm (R thigh)   POSTURE: rounded shoulders and forward head  PALPATION: Increased edema noted to R thigh and knee; TTP along lateral thigh and knee. Unable to tolerate patellar mobilizations secondary to pain/edema.  LOWER EXTREMITY ROM:  Active ROM Right eval Left eval  Hip flexion 40deg WFL  Hip extension    Hip abduction  [facility]  Hip adduction    Hip internal rotation  [facility]  Hip external rotation  [facility]  Knee flexion 30deg WFL  Knee extension 0deg [facility]  Ankle dorsiflexion [facility]  Ankle plantarflexion WFL   Ankle inversion    Ankle eversion     (Blank rows = not tested)  LOWER EXTREMITY MMT:  MMT Right eval Left eval  Hip flexion 2-   Hip extension    Hip abduction    Hip adduction    Hip internal rotation    Hip external rotation    Knee flexion 3-   Knee extension 3-   Ankle dorsiflexion    Ankle plantarflexion    Ankle inversion    Ankle eversion     (Blank rows = not tested)   FUNCTIONAL TESTS:  5 times sit to stand: 36sec, demonstrating increased R hip ABD to avoid R knee flexion with descent. Increased time/effort for standing. BUE for support on arm rests.  GAIT: Distance walked: 32f x2 Assistive device utilized: Single point cane Level of assistance: SBA Comments: 3 point gait pattern; decreased R stance, R foot clearance, L step length, and RUE swing    TODAY'S TREATMENT: 07/25/22   Manual therapy  Patellar joint mobs Sup/Inf/medial/lateral: grade 3 for improved knee flexion/extension, x6 with 5 sec bouts/direction.   R vastus medialis/lateralis and proximal M gastroc head rolling stick to address pain increased levels in this area as well as ischemic TP release and TP mobilizations to alleviate pain in these areas  Completed joint mobilizations in supine position with anterior posterior, PA, and external and internal rotation mobilizations.  Patient reports no pain mobilizations were grade 2-3.  Completed approximately 2 minutes of each of these Manual gastroc stretch x45-second hold following trigger point mobilizations and gastrocnemius muscle.  TherEx: Supine SAQ attempted but caused pain in R knee, attempted mobilization as above but did not improve pain with this activity  Quad set with towell roll x 15 x 5 second holds   SLR 2 x 10 , good quad activation with this wihtout knee pain present.   SL SLR to target R hip abductors. X 10 , significant fatigue noted in hip abductors.   NuStep level 1 x 5 minutes no charge   PATIENT EDUCATION:  Education details: f patient educated regarding further home exercises including gastroc stretch as well as patella mobilizations and verbalize as well as demonstrated understanding. Person educated: Patient Education method: Explanation, Demonstration, Tactile cues, Verbal cues, and Handouts Education comprehension: verbalized understanding   HOME EXERCISE PROGRAM: RLE heel slides, quad sets, SAQ, 3x10  Access Code: [email or web address]: [email or web address] Date: 07/21/2022 Prepared by: SAmalia Hailey Exercises - Supine Active Straight Leg Raise  - 1 x daily - 5-7 x weekly - 3 sets - 6 reps - Sidelying Hip Abduction  - 1 x daily - 5-7 x weekly - 3 sets - 6 reps - Seated Calf Stretch  - 1 x daily - 7 x weekly - 3 sets - 10 reps   ASSESSMENT:  CLINICAL IMPRESSION:   Continued with current plan of care as laid out in evaluation and recent prior sessions. Pt remains motivated to advance progress toward goals in order to maximize independence and  safety at home. Pt requires high level assistance and cuing for completion of exercises in order to provide adequate level of stimulation challenge while minimizing pain and discomfort when possible. Pt closely monitored throughout session pt response and to maximize patient safety during interventions.  Patient making some progress with therapy of therapeutic progress is not at level will be suspected with her surgical procedure date, in addition patient is having signs and symptoms inconsistent with her surgical  procedure such as numbness in her involved lower extremity along the area of her foot.  Further follow-up regarding this is recommended by physical therapist and patient instructed to inform Dr. of physical therapists recommendations at follow-up visit next week.  OBJECTIVE IMPAIRMENTS Abnormal gait, decreased activity tolerance, decreased balance, decreased endurance, decreased mobility, difficulty walking, decreased ROM, decreased strength, increased edema, impaired perceived functional ability, impaired flexibility, improper body mechanics, postural dysfunction, and pain.   ACTIVITY LIMITATIONS lifting, bending, sitting, standing, squatting, sleeping, stairs, transfers, bed mobility, dressing, locomotion level, and car transfers, bed mobility  PARTICIPATION LIMITATIONS: meal prep, cleaning, laundry, driving, shopping, community activity, and occupation  PERSONAL FACTORS Age and 3+ comorbidities: HTN, hx CA, CHF  are also affecting patient's functional outcome.   REHAB POTENTIAL: Good  CLINICAL DECISION MAKING: Stable/uncomplicated  EVALUATION COMPLEXITY: Low   GOALS: Goals reviewed with patient? Yes  SHORT TERM GOALS: Target date: 08/15/2022  Pt will be independent and compliant with HEP in order to decrease knee pain and increase strength in order to improve pain-free function at home and work. Baseline: HEP provided Goal status: INITIAL   LONG TERM GOALS: Target date:  09/03/2022     Patient will increase FOTO score to equal to or greater than 71% to demonstrate statistically significant improvement in mobility and quality of life. Baseline: 61% 8/30: 49%  Goal status: IN PROGRESS  2.  Patient will report a worst pain of 1-2/10 on VAS in R knee to improve tolerance with ADLs and reduced symptoms with activities. Baseline: 6-7/10; 8/30: 7/10 thigh pain and foot numb  Goal status: IN PROGRESS  3.  Pt will be able to complete 5x STS in </= 11sec indicating decreased risk of falls per this outcome measure based on pt appropriate age group. Baseline: 36sec 8/30: 32 seconds no UE support  Goal status: IN PROGRESS  4.  Pt will demonstrate grossly 4+/5 RLE strength for improved independence with ADL's, IADL's, and functional mobility. Baseline: 2-/5 to 3-/5 8/30: see note Goal status: In Progress   4.   Patient will be independent with ascend/descend 5 steps using single UE in step over step pattern without LOB. Baseline: step to pattern with BUE support  Goal status: INITIAL  PLAN: PT FREQUENCY: 1-2x/week  PT DURATION: 6 weeks  PLANNED INTERVENTIONS: Therapeutic exercises, Therapeutic activity, Neuromuscular re-education, Balance training, Gait training, Patient/Family education, Self Care, Joint mobilization, Joint manipulation, Stair training, DME instructions, Dry Needling, Electrical stimulation, Cryotherapy, Moist heat, scar mobilization, Traction, Ultrasound, Ionotophoresis '4mg'$ /ml Dexamethasone, Manual therapy, and Re-evaluation  PLAN FOR NEXT SESSION: assess neural tension in the R leg (down into 1st-2nd digit). Seated knee distraction with A/P and IR/ER mobilizations. R knee strengthening and flexion AROM. May benefit from NMES for R quad strengthening.    Particia Lather PT  11:31 AM,07/25/22 Physical Therapist - [facility]

## 2022-07-30 ENCOUNTER — Encounter: Payer: Self-pay | Admitting: Physical Therapy

## 2022-07-30 ENCOUNTER — Ambulatory Visit: Payer: PRIVATE HEALTH INSURANCE

## 2022-07-30 ENCOUNTER — Other Ambulatory Visit: Payer: Self-pay

## 2022-07-30 DIAGNOSIS — R262 Difficulty in walking, not elsewhere classified: Secondary | ICD-10-CM

## 2022-07-30 DIAGNOSIS — M6281 Muscle weakness (generalized): Secondary | ICD-10-CM

## 2022-07-30 DIAGNOSIS — M25561 Pain in right knee: Secondary | ICD-10-CM | POA: Diagnosis not present

## 2022-07-30 NOTE — Therapy (Signed)
OUTPATIENT PHYSICAL THERAPY LOWER EXTREMITY TREATMENT   Patient Name: Mandy Roberts MRN: [id number] DOB:07/04/1961, 61 y.o., female Today's Date: 07/30/2022   PT End of Session - 07/30/22 0925     Visit Number 7    Number of Visits 17   eval + 8 visits   Date for PT Re-Evaluation 09/03/22    Authorization Type 6/9    Authorization Time Period 9 visits ( including eval) authorized initially with workman's comp    Authorization - Visit Number 6    Authorization - Number of Visits 9    PT Start Time 9767    PT Stop Time 0929    PT Time Calculation (min) 42 min    Activity Tolerance Patient limited by pain;Patient tolerated treatment well    Behavior During Therapy [facility] for tasks assessed/performed                 Past Medical History:  Diagnosis Date   Atrial fibrillation ([place])    Breast cancer ([place]) 1990/1997    left mastectomy ,had recurrence in left suprclavic nodes several yrs ago-treated with radiation and chemo   CHF (congestive heart failure) ([place])    Colon polyp 3419   Complication of anesthesia    Difficult intubation    expectorated blood 3-4 days after surgery in 2011   Difficult intubation    hypoxia with endoscopy in 2016   GERD (gastroesophageal reflux disease)    History of chemotherapy    5-FU/AC in 1990 under the care of Dr. Oliva Bustard. pt also tx with tamoxifen and arimidex   History of radiation therapy    radiation treat [facility]   Hypertension 2008   Personal history of chemotherapy 1990   BREAST CA   Personal history of malignant neoplasm of breast 1990    left mastectomy    Personal history of radiation therapy 1997   BREAST CA   Sinusitis    Sleep apnea    history of, went away after gastric surgery   Past Surgical History:  Procedure Laterality Date   [place]   COLONOSCOPY  2008, 2015   Dr. Jamal Collin   COLONOSCOPY WITH PROPOFOL N/A 10/12/2019   Procedure: COLONOSCOPY WITH PROPOFOL;  Surgeon:  Robert Bellow, MD;  Location: [facility] ENDOSCOPY;  Service: Endoscopy;  Laterality: N/A;  DR BYRNETT WILL UPDATE H&P ON PROCEDURE DATE   DILATION AND CURETTAGE OF UTERUS     KNEE ARTHROPLASTY Left 09/07/2017   Procedure: COMPUTER ASSISTED TOTAL KNEE ARTHROPLASTY;  Surgeon: Dereck Leep, MD;  Location: [facility] ORS;  Service: Orthopedics;  Laterality: Left;   KNEE ARTHROSCOPY Bilateral    KNEE ARTHROSCOPY Right 06/24/2022   Procedure: RIGHT KNEE ARTHROSCOPY;  Surgeon: Melrose Nakayama, MD;  Location: [facility] ORS;  Service: Orthopedics;  Laterality: Right;   KNEE SURGERY Left 2009, 2010, 2012   LAPAROSCOPIC GASTRIC RESTRICTIVE DUODENAL PROCEDURE (DUODENAL SWITCH) N/A 04/17/2015   Procedure: LAPAROSCOPIC GASTRIC RESTRICTIVE DUODENAL PROCEDURE, single anastamosis ;  Surgeon: Bonner Puna, MD;  Location: [facility] ORS;  Service: General;  Laterality: N/A;   MASTECTOMY Left 1990   BREAST CA   TONSILLECTOMY     UPPER GI ENDOSCOPY N/A 04/17/2015   Procedure: UPPER GI ENDOSCOPY;  Surgeon: Bonner Puna, MD;  Location: [facility] ORS;  Service: General;  Laterality: N/A;   Patient Active Problem List   Diagnosis Date Noted   Dental abscess 05/27/2020   Abscess of mandible_left 05/26/2020   Thrombocytopenia (  [place]) 05/26/2020   Facial cellulitis_left 05/26/2020   Varicose veins of both lower extremities with inflammation 05/12/2019   Chronic venous insufficiency 05/12/2019   Status post total left knee replacement 09/07/2017   Chronic bilateral thoracic back pain 09/01/2017   Neck pain 09/01/2017   Symptomatic mammary hypertrophy 09/01/2017   Difficult airway for intubation 04/09/2015   Chronic nasal congestion 04/09/2015   Atrial fibrillation ([place]) 09/16/2013   Obstructive sleep apnea 09/16/2013   Morbid obesity ([place]) 09/16/2013   Personal history of breast cancer 02/09/2013   Hypertension    Cancer ([place])     PCP: Perrin Maltese, MD  REFERRING PROVIDER: Melrose Nakayama, MD  REFERRING DIAG: acute pain of right  knee  THERAPY DIAG:  Difficulty in walking, not elsewhere classified  Muscle weakness (generalized)  Acute pain of right knee  Rationale for Evaluation and Treatment Rehabilitation  ONSET DATE: 06/24/2022  SUBJECTIVE:   SUBJECTIVE STATEMENT:  Pt reports she had a boring weekend and did not do much.  Pt notes she is not having any difficulties with her current exercises.  Pt notes 2-3/10 pain at this current time.  Pt notes she is seeing her MD for follow-up later today.  PERTINENT HISTORY:  Pt is s/p R knee arthroscopy on 06/24/22 by Dr. Rhona Raider and was DC home with RW. Pt to attend 3 PT visits prior to surgery for conservative management.  Pt arrives to OPPT with c/o R knee and R thigh pain (thigh>knee) secondary to recent procedure. Since procedure, pt notes she's having increased pain levels, and is managing pain with over the counter medication. Pt currently stating that she does not take stronger prescription medication as she is still driving. Pt notes she ambulates with hurry cane at home. Since procedure, pt notes increased difficulty with: prolonged walking/standing, car transfers, tub transfers, LB dressing, stairs, bed mobility, driving, cooking, cleaning, grocery shopping. Pt states that due to N/T radiating into R foot she is unable to feel break/pedal, and is considering having family assist with transportation.   PAIN:  Are you having pain? Yes: NPRS scale: 2-3/10 Pain location: R thigh and distal quad tendon Pain description: dull/constant ache down leg, burning/tingling (foot)  PRECAUTIONS: Fall  WEIGHT BEARING RESTRICTIONS No  FALLS:  Has patient fallen in last 6 months? Yes. 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 PROCEDURE:  Right knee abrasion, chondroplasty of patellofemoral.  PATIENT SURVEYS:  FOTO 29%  COGNITION: Overall cognitive status: Within functional limits for tasks assessed     SENSATION: TTP along R lateral thigh, anterior knee, joint space, and medial/lateral knee  EDEMA:  Circumferential: 50cm (R knee), 70cm (R thigh)   POSTURE: rounded shoulders and forward head  PALPATION: Increased edema noted to R thigh and knee; TTP along lateral thigh and knee. Unable to tolerate patellar mobilizations secondary to pain/edema.  LOWER EXTREMITY ROM:  Active ROM Right eval Left eval  Hip flexion 40deg WFL  Hip extension    Hip abduction  WFL  Hip adduction    Hip internal rotation  WFL  Hip external rotation  [facility]  Knee flexion 30deg WFL  Knee extension 0deg [facility]  Ankle dorsiflexion WFL   Ankle plantarflexion WFL   Ankle inversion    Ankle eversion     (Blank rows = not tested)  LOWER EXTREMITY MMT:  MMT Right eval Left eval  Hip flexion 2-   Hip extension    Hip abduction    Hip adduction    Hip internal rotation    Hip external rotation    Knee flexion 3-   Knee extension 3-   Ankle dorsiflexion    Ankle plantarflexion    Ankle inversion    Ankle eversion     (Blank rows = not tested)   FUNCTIONAL TESTS:  5 times sit to stand: 36sec, demonstrating increased R hip ABD to avoid R knee flexion with descent. Increased time/effort for standing. BUE for support on arm rests.  GAIT: Distance walked: 29f x2 Assistive device utilized: Single point cane Level of assistance: SBA Comments: 3 point gait pattern; decreased R stance, R foot clearance, L step length, and RUE swing    TODAY'S TREATMENT: 07/30/22   Manual  therapy Patellar joint mobs Sup/Inf/medial/lateral: grade 3 for improved knee flexion/extension, x6 with 5 sec bouts/direction.   R vastus medialis/lateralis, IT Band, and proximal M gastroc head rolling stick to address pain increased levels in this area as well as ischemic TP release and TP mobilizations to alleviate pain in these areas.    Completed joint mobilizations in supine position with anterior posterior, PA, and external and internal rotation mobilizations.  Patient reports no pain with mobilizations, Grade II-III, completing approximately 2 minutes of each of these  Supine nerve glides, 30 sec x multiple bouts for increased nerve mobility Supine R LE hamstring stretch, SLR, 30 sec bouts Supine R LE hamstring stretch, bent knee, 30 sec bouts Supine R LE figure 4 stretch, 30 sec bouts Supine R LE piriformis stretch, 30 sec bouts    TherEx:  Supine SAQ 2x15, with better control and able to have greater eccentric control when lowering LE Supine heel slides, pt performing for comfort, 2x10 SLR 2 x 10 , good quad activation with this wihtout knee pain present.  Sidelying SLR to target R hip abductors. 2x10 Sidelying clamshells, 2x15 Sidelying reverse clamshells, 2x15    PATIENT EDUCATION:  Education details: f patient educated regarding further home exercises including gastroc stretch as well as patella mobilizations and verbalize as well as demonstrated understanding. Person educated: Patient Education method: Explanation, Demonstration, Tactile cues, Verbal cues, and Handouts Education comprehension: verbalized understanding   HOME EXERCISE PROGRAM: RLE heel slides, quad sets, SAQ, 3x10  Access Code: [email or web address]: [email or web address] Date: 07/21/2022 Prepared by: SAmalia Hailey Exercises - Supine Active Straight Leg Raise  - 1 x daily - 5-7 x weekly - 3 sets - 6 reps - Sidelying Hip Abduction  - 1 x daily - 5-7 x weekly - 3 sets - 6 reps - Seated  Calf Stretch  - 1 x daily - 7 x weekly - 3 sets - 10 reps   ASSESSMENT:  CLINICAL IMPRESSION:   Pt limited by pain with manual therapy approach and noted some soreness following session.  Pt educated on the normal response to manual therapy, and likely feeling sore over the next 24-48 hours, with residual relief following soreness.  Pt able to tolerate increased exercise bouts without a large amount of increased pain.  Pt to continue with skilled therapy and has follow-up  visit with MD today.  Will continue to monitor and progress pt per current POC.      OBJECTIVE IMPAIRMENTS Abnormal gait, decreased activity tolerance, decreased balance, decreased endurance, decreased mobility, difficulty walking, decreased ROM, decreased strength, increased edema, impaired perceived functional ability, impaired flexibility, improper body mechanics, postural dysfunction, and pain.   ACTIVITY LIMITATIONS lifting, bending, sitting, standing, squatting, sleeping, stairs, transfers, bed mobility, dressing, locomotion level, and car transfers, bed mobility  PARTICIPATION LIMITATIONS: meal prep, cleaning, laundry, driving, shopping, community activity, and occupation  PERSONAL FACTORS Age and 3+ comorbidities: HTN, hx CA, CHF  are also affecting patient's functional outcome.   REHAB POTENTIAL: Good  CLINICAL DECISION MAKING: Stable/uncomplicated  EVALUATION COMPLEXITY: Low   GOALS: Goals reviewed with patient? Yes  SHORT TERM GOALS: Target date: 08/20/2022  Pt will be independent and compliant with HEP in order to decrease knee pain and increase strength in order to improve pain-free function at home and work. Baseline: HEP provided Goal status: INITIAL   LONG TERM GOALS: Target date: 09/03/2022     Patient will increase FOTO score to equal to or greater than 71% to demonstrate statistically significant improvement in mobility and quality of life. Baseline: 61% 8/30: 49%  Goal status: IN  PROGRESS  2.  Patient will report a worst pain of 1-2/10 on VAS in R knee to improve tolerance with ADLs and reduced symptoms with activities. Baseline: 6-7/10; 8/30: 7/10 thigh pain and foot numb  Goal status: IN PROGRESS  3.  Pt will be able to complete 5x STS in </= 11sec indicating decreased risk of falls per this outcome measure based on pt appropriate age group. Baseline: 36sec 8/30: 32 seconds no UE support  Goal status: IN PROGRESS  4.  Pt will demonstrate grossly 4+/5 RLE strength for improved independence with ADL's, IADL's, and functional mobility. Baseline: 2-/5 to 3-/5 8/30: see note Goal status: In Progress   4.   Patient will be independent with ascend/descend 5 steps using single UE in step over step pattern without LOB. Baseline: step to pattern with BUE support  Goal status: INITIAL  PLAN: PT FREQUENCY: 1-2x/week  PT DURATION: 6 weeks  PLANNED INTERVENTIONS: Therapeutic exercises, Therapeutic activity, Neuromuscular re-education, Balance training, Gait training, Patient/Family education, Self Care, Joint mobilization, Joint manipulation, Stair training, DME instructions, Dry Needling, Electrical stimulation, Cryotherapy, Moist heat, scar mobilization, Traction, Ultrasound, Ionotophoresis '4mg'$ /ml Dexamethasone, Manual therapy, and Re-evaluation  PLAN FOR NEXT SESSION: assess neural tension in the R leg (down into 1st-2nd digit). Seated knee distraction with A/P and IR/ER mobilizations. R knee strengthening and flexion AROM. May benefit from NMES for R quad strengthening.    Gwenlyn Saran, PT, DPT 07/30/22, 11:41 AM Physical Therapist - [facility]

## 2022-08-04 ENCOUNTER — Ambulatory Visit: Payer: PRIVATE HEALTH INSURANCE | Attending: Orthopaedic Surgery | Admitting: Physical Therapy

## 2022-08-04 DIAGNOSIS — M25561 Pain in right knee: Secondary | ICD-10-CM | POA: Insufficient documentation

## 2022-08-04 DIAGNOSIS — R262 Difficulty in walking, not elsewhere classified: Secondary | ICD-10-CM | POA: Insufficient documentation

## 2022-08-04 DIAGNOSIS — M6281 Muscle weakness (generalized): Secondary | ICD-10-CM | POA: Insufficient documentation

## 2022-08-06 ENCOUNTER — Encounter: Payer: Self-pay | Admitting: Physical Therapy

## 2022-08-06 ENCOUNTER — Ambulatory Visit: Payer: PRIVATE HEALTH INSURANCE | Admitting: Physical Therapy

## 2022-08-06 DIAGNOSIS — M6281 Muscle weakness (generalized): Secondary | ICD-10-CM | POA: Diagnosis present

## 2022-08-06 DIAGNOSIS — R262 Difficulty in walking, not elsewhere classified: Secondary | ICD-10-CM

## 2022-08-06 DIAGNOSIS — M25561 Pain in right knee: Secondary | ICD-10-CM

## 2022-08-06 NOTE — Therapy (Signed)
OUTPATIENT PHYSICAL THERAPY LOWER EXTREMITY TREATMENT   Patient Name: Mandy Roberts MRN: [id number] DOB:[birth year], 61 y.o., female Today's Date: 08/06/2022   PT End of Session - 08/06/22 1140     Visit Number 8    Number of Visits 17   eval + 8 visits   Date for PT Re-Evaluation 09/03/22    Authorization Type 6/9    Authorization Time Period 9 visits ( including eval) authorized initially with workman's comp    Authorization - Visit Number 6    Authorization - Number of Visits 16    Activity Tolerance Patient limited by pain;Patient tolerated treatment well    Behavior During Therapy [facility] for tasks assessed/performed                Past Medical History:  Diagnosis Date   Atrial fibrillation ([place])    Breast cancer ([place]) 1990/1997    left mastectomy ,had recurrence in left suprclavic nodes several yrs ago-treated with radiation and chemo   CHF (congestive heart failure) ([place])    Colon polyp [id number]   Complication of anesthesia    Difficult intubation    expectorated blood 3-4 days after surgery in 2011   Difficult intubation    hypoxia with endoscopy in 2016   GERD (gastroesophageal reflux disease)    History of chemotherapy    5-FU/AC in 1990 under the care of Dr. Oliva Bustard. pt also tx with tamoxifen and arimidex   History of radiation therapy    radiation treat at [facility]   Hypertension 2008   Personal history of chemotherapy 1990   BREAST CA   Personal history of malignant neoplasm of breast 1990    left mastectomy    Personal history of radiation therapy 1997   BREAST CA   Sinusitis    Sleep apnea    history of, went away after gastric surgery   Past Surgical History:  Procedure Laterality Date   [place]   COLONOSCOPY  2008, 2015   Dr. Jamal Collin   COLONOSCOPY WITH PROPOFOL N/A 10/12/2019   Procedure: COLONOSCOPY WITH PROPOFOL;  Surgeon: Robert Bellow, MD;  Location: [facility] ENDOSCOPY;  Service: Endoscopy;  Laterality:  N/A;  DR BYRNETT WILL UPDATE H&P ON PROCEDURE DATE   DILATION AND CURETTAGE OF UTERUS     KNEE ARTHROPLASTY Left 09/07/2017   Procedure: COMPUTER ASSISTED TOTAL KNEE ARTHROPLASTY;  Surgeon: Dereck Leep, MD;  Location: [facility] ORS;  Service: Orthopedics;  Laterality: Left;   KNEE ARTHROSCOPY Bilateral    KNEE ARTHROSCOPY Right 06/24/2022   Procedure: RIGHT KNEE ARTHROSCOPY;  Surgeon: Melrose Nakayama, MD;  Location: [facility] ORS;  Service: Orthopedics;  Laterality: Right;   KNEE SURGERY Left 2009, 2010, 2012   LAPAROSCOPIC GASTRIC RESTRICTIVE DUODENAL PROCEDURE (DUODENAL SWITCH) N/A 04/17/2015   Procedure: LAPAROSCOPIC GASTRIC RESTRICTIVE DUODENAL PROCEDURE, single anastamosis ;  Surgeon: Bonner Puna, MD;  Location: [facility] ORS;  Service: General;  Laterality: N/A;   MASTECTOMY Left 1990   BREAST CA   TONSILLECTOMY     UPPER GI ENDOSCOPY N/A 04/17/2015   Procedure: UPPER GI ENDOSCOPY;  Surgeon: Bonner Puna, MD;  Location: [facility] ORS;  Service: General;  Laterality: N/A;   Patient Active Problem List   Diagnosis Date Noted   Dental abscess 05/27/2020   Abscess of mandible_left 05/26/2020   Thrombocytopenia ([place]) 05/26/2020   Facial cellulitis_left 05/26/2020   Varicose veins of both lower extremities with inflammation 05/12/2019   Chronic venous insufficiency 05/12/2019  Status post total left knee replacement 09/07/2017   Chronic bilateral thoracic back pain 09/01/2017   Neck pain 09/01/2017   Symptomatic mammary hypertrophy 09/01/2017   Difficult airway for intubation 04/09/2015   Chronic nasal congestion 04/09/2015   Atrial fibrillation ([place]) 09/16/2013   Obstructive sleep apnea 09/16/2013   Morbid obesity ([place]) 09/16/2013   Personal history of breast cancer 02/09/2013   Hypertension    Cancer ([place])     PCP: Perrin Maltese, MD  REFERRING PROVIDER: Melrose Nakayama, MD  REFERRING DIAG: acute pain of right knee  THERAPY DIAG:  Difficulty in walking, not elsewhere classified  Muscle  weakness (generalized)  Acute pain of right knee  Rationale for Evaluation and Treatment Rehabilitation  ONSET DATE: 06/24/2022  SUBJECTIVE:   SUBJECTIVE STATEMENT: Pt reports continue dpain and dysfunction in her R LE. Pt has s/s of nerve involvement AEB numbness in her foot on plantar aspect.  PERTINENT HISTORY:  Pt is s/p R knee arthroscopy on 06/24/22 by Dr. Rhona Raider and was DC home with RW. Pt to attend 3 PT visits prior to surgery for conservative management.   Pt arrives to OPPT with c/o R knee and R thigh pain (thigh>knee) secondary to recent procedure. Since procedure, pt notes she's having increased pain levels, and is managing pain with over the counter medication. Pt currently stating that she does not take stronger prescription medication as she is still driving. Pt notes she ambulates with hurry cane at home. Since procedure, pt notes increased difficulty with: prolonged walking/standing, car transfers, tub transfers, LB dressing, stairs, bed mobility, driving, cooking, cleaning, grocery shopping. Pt states that due to N/T radiating into R foot she is unable to feel break/pedal, and is considering having family assist with transportation.   PAIN:  Are you having pain? Yes: NPRS scale: 1/10 Pain location: R thigh and foot Pain description: dull/constant ache down leg, burning/tingling (foot)  PRECAUTIONS: Fall  WEIGHT BEARING RESTRICTIONS No  FALLS:  Has patient fallen in last 6 months? Yes. 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 PROCEDURE:  Right knee abrasion, chondroplasty of patellofemoral.  PATIENT SURVEYS:  FOTO 29%  COGNITION:  Overall cognitive status: Within functional limits for tasks assessed     SENSATION: TTP along R lateral thigh, anterior knee, joint space, and medial/lateral knee  EDEMA:  Circumferential: 50cm (R knee), 70cm (R thigh)   POSTURE: rounded shoulders and forward head  PALPATION: Increased edema noted to R thigh and knee; TTP along lateral thigh and knee. Unable to tolerate patellar mobilizations secondary to pain/edema.  LOWER EXTREMITY ROM:  Active ROM Right eval Left eval  Hip flexion 40deg WFL  Hip extension    Hip abduction  [facility]  Hip adduction    Hip internal rotation  [facility]  Hip external rotation  [facility]  Knee flexion 30deg WFL  Knee extension 0deg [facility]  Ankle dorsiflexion WFL   Ankle plantarflexion WFL   Ankle inversion    Ankle eversion     (Blank rows = not tested)  LOWER EXTREMITY MMT:  MMT Right eval Left eval  Hip flexion 2-   Hip extension    Hip abduction    Hip adduction    Hip internal rotation    Hip external rotation    Knee flexion 3-   Knee extension 3-   Ankle dorsiflexion    Ankle plantarflexion    Ankle inversion    Ankle eversion     (Blank rows = not tested)   FUNCTIONAL TESTS:  5 times sit to stand: 36sec, demonstrating increased R hip ABD to avoid R knee flexion with descent. Increased time/effort for standing. BUE for support on arm rests.  GAIT: Distance walked: 39f x2 Assistive device utilized: Single point cane Level of assistance: SBA Comments: 3 point gait pattern; decreased R stance, R foot clearance, L step length, and RUE swing    TODAY'S TREATMENT: 08/06/22   Manual therapy  Patellar joint mobs Sup/Inf/medial/lateral: grade 3 for improved knee flexion/extension, x6 with 5 sec bouts/direction.  R vastus medialis/lateralis and proximal M gastroc head rolling stick to address  pain increased levels in this area as well as ischemic TP release and TP mobilizations to alleviate pain in these areas  Completed joint mobilizations in supine position with anterior posterior, PA, and external and internal rotation mobilizations.  Patient reports no pain mobilizations were grade 2-3.  Completed approximately 2 minutes of each of these Manual gastroc stretch x45-second hold following trigger point mobilizations and gastrocnemius muscle.  TherEx: Nustep 5 min at level 2 LE only for LE strenght and endurance.  Supine SAQ x 10 with 5 second holds    SLR 2 way ( hip IR, neutral) , good quad activation with this wihtout knee pain present. X 10 ea   SL SLR to target R hip abductors. X 10 , significant fatigue noted in hip abductors.   SL clamshell 2 x 10 reps   HS curls seated RTB   Seated heel raise on 1/2 foam roller 2.5 # with 5 second holds   Mini squats at // bars x 10, significant cues for proper form and with porper form pt utilizes heavy UE assist, will continue to practice in future sessions.       PATIENT EDUCATION:  Education details:  patient educated regarding further home exercises including gastroc stretch as well as patella mobilizations and verbalize as well as demonstrated understanding. Person educated: Patient Education method: Explanation, Demonstration, Tactile cues, Verbal cues, and Handouts Education comprehension: verbalized understanding   HOME EXERCISE PROGRAM: RLE heel slides, quad sets, SAQ, 3x10  Access Code: [email or web address]: [email or web address] Date: 07/21/2022 Prepared by: SAmalia Hailey Exercises - Supine Active Straight Leg Raise  - 1 x daily - 5-7 x weekly - 3 sets - 6 reps - Sidelying Hip Abduction  - 1 x daily - 5-7 x weekly - 3 sets - 6 reps - Seated Calf Stretch  - 1 x daily - 7 x weekly - 3 sets - 10 reps   ASSESSMENT:  CLINICAL IMPRESSION:   Continued with current plan of care as laid out in evaluation and  recent prior sessions. Pt remains motivated to advance progress toward goals in order to maximize independence and safety at home. Pt requires high level assistance and cuing for completion of exercises in order to provide adequate level of stimulation challenge while minimizing pain and discomfort when possible. Pt closely monitored throughout session pt response and to maximize patient safety during interventions.  Patient making ipmroved progress at this time AED decreased pain  with interventions this session.   OBJECTIVE IMPAIRMENTS Abnormal gait, decreased activity tolerance, decreased balance, decreased endurance, decreased mobility, difficulty walking, decreased ROM, decreased strength, increased edema, impaired perceived functional ability, impaired flexibility, improper body mechanics, postural dysfunction, and pain.   ACTIVITY LIMITATIONS lifting, bending, sitting, standing, squatting, sleeping, stairs, transfers, bed mobility, dressing, locomotion level, and car transfers, bed mobility  PARTICIPATION LIMITATIONS: meal prep, cleaning, laundry, driving, shopping, community activity, and occupation  PERSONAL FACTORS Age and 3+ comorbidities: HTN, hx CA, CHF  are also affecting patient's functional outcome.   REHAB POTENTIAL: Good  CLINICAL DECISION MAKING: Stable/uncomplicated  EVALUATION COMPLEXITY: Low   GOALS: Goals reviewed with patient? Yes  SHORT TERM GOALS: Target date: 08/27/2022  Pt will be independent and compliant with HEP in order to decrease knee pain and increase strength in order to improve pain-free function at home and work. Baseline: HEP provided Goal status: INITIAL   LONG TERM GOALS: Target date: 09/03/2022     Patient will increase FOTO score to equal to or greater than 71% to demonstrate statistically significant improvement in mobility and quality of life. Baseline: 61% 8/30: 49%  Goal status: IN PROGRESS  2.  Patient will report a worst pain of 1-2/10  on VAS in R knee to improve tolerance with ADLs and reduced symptoms with activities. Baseline: 6-7/10; 8/30: 7/10 thigh pain and foot numb  Goal status: IN PROGRESS  3.  Pt will be able to complete 5x STS in </= 11sec indicating decreased risk of falls per this outcome measure based on pt appropriate age group. Baseline: 36sec 8/30: 32 seconds no UE support  Goal status: IN PROGRESS  4.  Pt will demonstrate grossly 4+/5 RLE strength for improved independence with ADL's, IADL's, and functional mobility. Baseline: 2-/5 to 3-/5 8/30: see note Goal status: In Progress   4.   Patient will be independent with ascend/descend 5 steps using single UE in step over step pattern without LOB. Baseline: step to pattern with BUE support  Goal status: INITIAL  PLAN: PT FREQUENCY: 1-2x/week  PT DURATION: 6 weeks  PLANNED INTERVENTIONS: Therapeutic exercises, Therapeutic activity, Neuromuscular re-education, Balance training, Gait training, Patient/Family education, Self Care, Joint mobilization, Joint manipulation, Stair training, DME instructions, Dry Needling, Electrical stimulation, Cryotherapy, Moist heat, scar mobilization, Traction, Ultrasound, Ionotophoresis '4mg'$ /ml Dexamethasone, Manual therapy, and Re-evaluation  PLAN FOR NEXT SESSION:     Particia Lather PT  11:48 AM,08/06/22 Physical Therapist - [facility]

## 2022-08-08 ENCOUNTER — Encounter: Payer: 59 | Admitting: Physical Therapy

## 2022-08-10 NOTE — Therapy (Unsigned)
OUTPATIENT PHYSICAL THERAPY LOWER EXTREMITY TREATMENT   Patient Name: Mandy Roberts MRN: [id number] DOB:Mar 26, 1961, 61 y.o., female Today's Date: 08/11/2022   PT End of Session - 08/11/22 1520     Visit Number 9    Number of Visits 15   eval + 8 visits   Date for PT Re-Evaluation 09/03/22    Authorization Type 6/9    Authorization Time Period 9 visits ( including eval) authorized initially with workman's comp    Authorization - Visit Number 9    Authorization - Number of Visits 16    Progress Note Due on Visit 10    PT Start Time 1520    PT Stop Time 1600    PT Time Calculation (min) 40 min    Activity Tolerance Patient tolerated treatment well    Behavior During Therapy WFL for tasks assessed/performed                 Past Medical History:  Diagnosis Date   Atrial fibrillation ([place])    Breast cancer ([place]) 1990/1997    left mastectomy ,had recurrence in left suprclavic nodes several yrs ago-treated with radiation and chemo   CHF (congestive heart failure) ([place])    Colon polyp [id number]   Complication of anesthesia    Difficult intubation    expectorated blood 3-4 days after surgery in 2011   Difficult intubation    hypoxia with endoscopy in 2016   GERD (gastroesophageal reflux disease)    History of chemotherapy    5-FU/AC in 1990 under the care of Dr. Oliva Bustard. pt also tx with tamoxifen and arimidex   History of radiation therapy    radiation treat at [facility]   Hypertension 2008   Personal history of chemotherapy 1990   BREAST CA   Personal history of malignant neoplasm of breast 1990    left mastectomy    Personal history of radiation therapy 1997   BREAST CA   Sinusitis    Sleep apnea    history of, went away after gastric surgery   Past Surgical History:  Procedure Laterality Date   [place]   COLONOSCOPY  2008, 2015   Dr. Jamal Collin   COLONOSCOPY WITH PROPOFOL N/A 10/12/2019   Procedure: COLONOSCOPY WITH PROPOFOL;   Surgeon: Robert Bellow, MD;  Location: [facility] ENDOSCOPY;  Service: Endoscopy;  Laterality: N/A;  DR BYRNETT WILL UPDATE H&P ON PROCEDURE DATE   DILATION AND CURETTAGE OF UTERUS     KNEE ARTHROPLASTY Left 09/07/2017   Procedure: COMPUTER ASSISTED TOTAL KNEE ARTHROPLASTY;  Surgeon: Dereck Leep, MD;  Location: [facility] ORS;  Service: Orthopedics;  Laterality: Left;   KNEE ARTHROSCOPY Bilateral    KNEE ARTHROSCOPY Right 06/24/2022   Procedure: RIGHT KNEE ARTHROSCOPY;  Surgeon: Melrose Nakayama, MD;  Location: [facility] ORS;  Service: Orthopedics;  Laterality: Right;   KNEE SURGERY Left 2009, 2010, 2012   LAPAROSCOPIC GASTRIC RESTRICTIVE DUODENAL PROCEDURE (DUODENAL SWITCH) N/A 04/17/2015   Procedure: LAPAROSCOPIC GASTRIC RESTRICTIVE DUODENAL PROCEDURE, single anastamosis ;  Surgeon: Bonner Puna, MD;  Location: [facility] ORS;  Service: General;  Laterality: N/A;   MASTECTOMY Left 1990   BREAST CA   TONSILLECTOMY     UPPER GI ENDOSCOPY N/A 04/17/2015   Procedure: UPPER GI ENDOSCOPY;  Surgeon: Bonner Puna, MD;  Location: [facility] ORS;  Service: General;  Laterality: N/A;   Patient Active Problem List   Diagnosis Date Noted   Dental abscess 05/27/2020   Abscess  of mandible_left 05/26/2020   Thrombocytopenia ([place]) 05/26/2020   Facial cellulitis_left 05/26/2020   Varicose veins of both lower extremities with inflammation 05/12/2019   Chronic venous insufficiency 05/12/2019   Status post total left knee replacement 09/07/2017   Chronic bilateral thoracic back pain 09/01/2017   Neck pain 09/01/2017   Symptomatic mammary hypertrophy 09/01/2017   Difficult airway for intubation 04/09/2015   Chronic nasal congestion 04/09/2015   Atrial fibrillation ([place]) 09/16/2013   Obstructive sleep apnea 09/16/2013   Morbid obesity ([place]) 09/16/2013   Personal history of breast cancer 02/09/2013   Hypertension    Cancer ([place])     PCP: Perrin Maltese, MD  REFERRING PROVIDER: Melrose Nakayama, MD  REFERRING DIAG: acute pain  of right knee  THERAPY DIAG:  Muscle weakness (generalized)  Acute pain of right knee  Difficulty in walking, not elsewhere classified  Rationale for Evaluation and Treatment Rehabilitation  ONSET DATE: 06/24/2022  SUBJECTIVE:   SUBJECTIVE STATEMENT: Pt reports continue dpain and dysfunction in her R LE. Pt has s/s of nerve involvement AEB numbness in her foot on plantar aspect.  PERTINENT HISTORY:  Pt is s/p R knee arthroscopy on 06/24/22 by Dr. Rhona Raider and was DC home with RW. Pt to attend 3 PT visits prior to surgery for conservative management.   Pt arrives to OPPT with c/o R knee and R thigh pain (thigh>knee) secondary to recent procedure. Since procedure, pt notes she's having increased pain levels, and is managing pain with over the counter medication. Pt currently stating that she does not take stronger prescription medication as she is still driving. Pt notes she ambulates with hurry cane at home. Since procedure, pt notes increased difficulty with: prolonged walking/standing, car transfers, tub transfers, LB dressing, stairs, bed mobility, driving, cooking, cleaning, grocery shopping. Pt states that due to N/T radiating into R foot she is unable to feel break/pedal, and is considering having family assist with transportation.   PAIN:  Are you having pain? Yes: NPRS scale: 1/10 Pain location: R thigh and foot Pain description: dull/constant ache down leg, burning/tingling (foot)  PRECAUTIONS: Fall  WEIGHT BEARING RESTRICTIONS No  FALLS:  Has patient fallen in last 6 months? Yes. 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 PROCEDURE:  Right knee abrasion, chondroplasty of patellofemoral.  PATIENT SURVEYS:  FOTO 29%  COGNITION:  Overall cognitive status: Within functional limits for tasks assessed     SENSATION: TTP along R lateral thigh, anterior knee, joint space, and medial/lateral knee  EDEMA:  Circumferential: 50cm (R knee), 70cm (R thigh)   POSTURE: rounded shoulders and forward head  PALPATION: Increased edema noted to R thigh and knee; TTP along lateral thigh and knee. Unable to tolerate patellar mobilizations secondary to pain/edema.  LOWER EXTREMITY ROM:  Active ROM Right eval Left eval  Hip flexion 40deg WFL  Hip extension    Hip abduction  [facility]  Hip adduction    Hip internal rotation  [facility]  Hip external rotation  [facility]  Knee flexion 30deg WFL  Knee extension 0deg [facility]  Ankle dorsiflexion WFL   Ankle plantarflexion WFL   Ankle inversion    Ankle eversion     (Blank rows = not tested)  LOWER EXTREMITY MMT:  MMT Right eval Left eval  Hip flexion 2-   Hip extension    Hip abduction    Hip adduction    Hip internal rotation    Hip external rotation    Knee flexion 3-   Knee extension 3-   Ankle dorsiflexion    Ankle plantarflexion    Ankle inversion    Ankle eversion     (Blank rows = not tested)   FUNCTIONAL TESTS:  5 times sit to stand: 36sec, demonstrating increased R hip ABD to avoid R knee flexion with descent. Increased time/effort for standing. BUE for support on arm rests.  GAIT: Distance walked: 52f x2 Assistive device utilized: Single point cane Level of assistance: SBA Comments: 3 point gait pattern; decreased R stance, R foot clearance, L step length, and RUE swing    TODAY'S TREATMENT: 08/11/22   Manual therapy  Patellar joint mobs  Sup/Inf/medial/lateral: grade 3 for improved knee flexion/extension, x10 with 5 sec bouts/direction.  R vastus medialis/lateralis and ITB and proximal M gastroc head rolling stick to address pain increased levels in this area as well as ischemic TP release and TP mobilizations to alleviate pain in these areas  Completed joint mobilizations in supine position with anterior posterior, PA, and external and internal rotation mobilizations.  Patient reports no pain mobilizations were grade 2-3.  Completed approximately 2 minutes of each of these   TherEx: Nustep 5 min at level 2 LE only for LE strenght and endurance.  Supine SAQ x 10 with 5 second holds, pain noted with large bolster but improved with small bolster ( completed 2-3 reps with large bolster prior to transition)    SLR 3 way ( hip IR, neutral, ER) , good quad activation with this wihtout knee pain present. X 10 ea   SL SLR to target R hip abductors. X 10 ,  SL clamshell 2 x 10 reps  Standing heel raise x 15   Stair training: R LE step ups and eccentric step down with B UE assist x 12       PATIENT EDUCATION:  Education details:  Pt educated throughout session about proper posture and technique with exercises. Improved exercise technique, movement at target joints, use of target muscles after min to mod verbal, visual, tactile cues. understanding. Person educated: Patient Education method: Explanation, Demonstration, Tactile cues, Verbal cues, and Handouts Education comprehension: verbalized understanding   HOME EXERCISE PROGRAM: RLE heel slides, quad sets, SAQ, 3x10  Access Code: [email or web address]: [email or web address] Date: 07/21/2022 Prepared by: SAmalia Hailey Exercises - Supine Active Straight Leg Raise  - 1 x daily - 5-7 x weekly - 3 sets - 6 reps - Sidelying Hip Abduction  - 1 x daily - 5-7 x weekly - 3 sets - 6 reps - Seated Calf Stretch  - 1 x daily - 7 x weekly - 3 sets - 10  reps   ASSESSMENT:  CLINICAL IMPRESSION:   Continued with current plan of care as laid out in evaluation and recent prior sessions. Pt remains motivated to advance progress toward goals in order to maximize independence and safety at home. Pt requires high level assistance and cuing for completion of exercises in order to provide adequate level of stimulation challenge while minimizing pain  and discomfort when possible.  Patient continuing to have discomfort along inferior portion of kneecap particularly with quad activation.  Patient did demonstrate improvement with stair navigation this date.  Pt closely monitored throughout session pt response and to maximize patient safety during interventions.  Patient making ipmroved progress at this time AED decreased pain with interventions this session.   OBJECTIVE IMPAIRMENTS Abnormal gait, decreased activity tolerance, decreased balance, decreased endurance, decreased mobility, difficulty walking, decreased ROM, decreased strength, increased edema, impaired perceived functional ability, impaired flexibility, improper body mechanics, postural dysfunction, and pain.   ACTIVITY LIMITATIONS lifting, bending, sitting, standing, squatting, sleeping, stairs, transfers, bed mobility, dressing, locomotion level, and car transfers, bed mobility  PARTICIPATION LIMITATIONS: meal prep, cleaning, laundry, driving, shopping, community activity, and occupation  PERSONAL FACTORS Age and 3+ comorbidities: HTN, hx CA, CHF  are also affecting patient's functional outcome.   REHAB POTENTIAL: Good  CLINICAL DECISION MAKING: Stable/uncomplicated  EVALUATION COMPLEXITY: Low   GOALS: Goals reviewed with patient? Yes  SHORT TERM GOALS: Target date: 09/01/2022  Pt will be independent and compliant with HEP in order to decrease knee pain and increase strength in order to improve pain-free function at home and work. Baseline: HEP provided Goal status: INITIAL   LONG  TERM GOALS: Target date: 09/03/2022     Patient will increase FOTO score to equal to or greater than 71% to demonstrate statistically significant improvement in mobility and quality of life. Baseline: 61% 8/30: 49%  Goal status: IN PROGRESS  2.  Patient will report a worst pain of 1-2/10 on VAS in R knee to improve tolerance with ADLs and reduced symptoms with activities. Baseline: 6-7/10; 8/30: 7/10 thigh pain and foot numb  Goal status: IN PROGRESS  3.  Pt will be able to complete 5x STS in </= 11sec indicating decreased risk of falls per this outcome measure based on pt appropriate age group. Baseline: 36sec 8/30: 32 seconds no UE support  Goal status: IN PROGRESS  4.  Pt will demonstrate grossly 4+/5 RLE strength for improved independence with ADL's, IADL's, and functional mobility. Baseline: 2-/5 to 3-/5 8/30: see note Goal status: In Progress   4.   Patient will be independent with ascend/descend 5 steps using single UE in step over step pattern without LOB. Baseline: step to pattern with BUE support  Goal status: INITIAL  PLAN: PT FREQUENCY: 1-2x/week  PT DURATION: 6 weeks  PLANNED INTERVENTIONS: Therapeutic exercises, Therapeutic activity, Neuromuscular re-education, Balance training, Gait training, Patient/Family education, Self Care, Joint mobilization, Joint manipulation, Stair training, DME instructions, Dry Needling, Electrical stimulation, Cryotherapy, Moist heat, scar mobilization, Traction, Ultrasound, Ionotophoresis '4mg'$ /ml Dexamethasone, Manual therapy, and Re-evaluation  PLAN FOR NEXT SESSION:     Particia Lather PT  4:25 PM,08/11/22 Physical Therapist - [facility]

## 2022-08-11 ENCOUNTER — Ambulatory Visit: Payer: PRIVATE HEALTH INSURANCE | Admitting: Physical Therapy

## 2022-08-11 ENCOUNTER — Encounter: Payer: Self-pay | Admitting: Physical Therapy

## 2022-08-11 ENCOUNTER — Encounter: Payer: 59 | Admitting: Physical Therapy

## 2022-08-11 DIAGNOSIS — M25561 Pain in right knee: Secondary | ICD-10-CM

## 2022-08-11 DIAGNOSIS — M6281 Muscle weakness (generalized): Secondary | ICD-10-CM

## 2022-08-11 DIAGNOSIS — R262 Difficulty in walking, not elsewhere classified: Secondary | ICD-10-CM

## 2022-08-13 ENCOUNTER — Encounter: Payer: 59 | Admitting: Physical Therapy

## 2022-08-14 ENCOUNTER — Ambulatory Visit: Payer: PRIVATE HEALTH INSURANCE | Admitting: Physical Therapy

## 2022-08-14 DIAGNOSIS — M6281 Muscle weakness (generalized): Secondary | ICD-10-CM

## 2022-08-14 DIAGNOSIS — R262 Difficulty in walking, not elsewhere classified: Secondary | ICD-10-CM

## 2022-08-14 DIAGNOSIS — M25561 Pain in right knee: Secondary | ICD-10-CM

## 2022-08-14 NOTE — Therapy (Signed)
OUTPATIENT PHYSICAL THERAPY LOWER EXTREMITY TREATMENT/ Physical Therapy Progress Note   Dates of reporting period  07/07/22   to   08/14/22    Patient Name: Mandy Roberts MRN: [id number] DOB:1961/06/12, 61 y.o., female Today's Date: 08/14/2022   PT End of Session - 08/14/22 1603     Visit Number 10    Number of Visits 15   eval + 8 visits   Date for PT Re-Evaluation 09/03/22    Authorization Type 6/9    Authorization Time Period 9 visits ( including eval) authorized initially with workman's comp    Authorization - Visit Number 9    Authorization - Number of Visits 16    Progress Note Due on Visit 10    PT Start Time 1600    PT Stop Time 1642    PT Time Calculation (min) 42 min    Activity Tolerance Patient tolerated treatment well    Behavior During Therapy WFL for tasks assessed/performed                  Past Medical History:  Diagnosis Date   Atrial fibrillation ([place])    Breast cancer ([place]) 1990/1997    left mastectomy ,had recurrence in left suprclavic nodes several yrs ago-treated with radiation and chemo   CHF (congestive heart failure) ([place])    Colon polyp 7588   Complication of anesthesia    Difficult intubation    expectorated blood 3-4 days after surgery in 2011   Difficult intubation    hypoxia with endoscopy in 2016   GERD (gastroesophageal reflux disease)    History of chemotherapy    5-FU/AC in 1990 under the care of Dr. Oliva Bustard. pt also tx with tamoxifen and arimidex   History of radiation therapy    radiation treat at [facility]   Hypertension 2008   Personal history of chemotherapy 1990   BREAST CA   Personal history of malignant neoplasm of breast 1990    left mastectomy    Personal history of radiation therapy 1997   BREAST CA   Sinusitis    Sleep apnea    history of, went away after gastric surgery   Past Surgical History:  Procedure Laterality Date   [place]   COLONOSCOPY  2008, 2015   Dr.  Jamal Collin   COLONOSCOPY WITH PROPOFOL N/A 10/12/2019   Procedure: COLONOSCOPY WITH PROPOFOL;  Surgeon: Robert Bellow, MD;  Location: [facility] ENDOSCOPY;  Service: Endoscopy;  Laterality: N/A;  DR BYRNETT WILL UPDATE H&P ON PROCEDURE DATE   DILATION AND CURETTAGE OF UTERUS     KNEE ARTHROPLASTY Left 09/07/2017   Procedure: COMPUTER ASSISTED TOTAL KNEE ARTHROPLASTY;  Surgeon: Dereck Leep, MD;  Location: [facility] ORS;  Service: Orthopedics;  Laterality: Left;   KNEE ARTHROSCOPY Bilateral    KNEE ARTHROSCOPY Right 06/24/2022   Procedure: RIGHT KNEE ARTHROSCOPY;  Surgeon: Melrose Nakayama, MD;  Location: [facility] ORS;  Service: Orthopedics;  Laterality: Right;   KNEE SURGERY Left 2009, 2010, 2012   LAPAROSCOPIC GASTRIC RESTRICTIVE DUODENAL PROCEDURE (DUODENAL SWITCH) N/A 04/17/2015   Procedure: LAPAROSCOPIC GASTRIC RESTRICTIVE DUODENAL PROCEDURE, single anastamosis ;  Surgeon: Bonner Puna, MD;  Location: [facility] ORS;  Service: General;  Laterality: N/A;   MASTECTOMY Left 1990   BREAST CA   TONSILLECTOMY     UPPER GI ENDOSCOPY N/A 04/17/2015   Procedure: UPPER GI ENDOSCOPY;  Surgeon: Bonner Puna, MD;  Location: [facility] ORS;  Service: General;  Laterality:  N/A;   Patient Active Problem List   Diagnosis Date Noted   Dental abscess 05/27/2020   Abscess of mandible_left 05/26/2020   Thrombocytopenia ([place]) 05/26/2020   Facial cellulitis_left 05/26/2020   Varicose veins of both lower extremities with inflammation 05/12/2019   Chronic venous insufficiency 05/12/2019   Status post total left knee replacement 09/07/2017   Chronic bilateral thoracic back pain 09/01/2017   Neck pain 09/01/2017   Symptomatic mammary hypertrophy 09/01/2017   Difficult airway for intubation 04/09/2015   Chronic nasal congestion 04/09/2015   Atrial fibrillation ([place]) 09/16/2013   Obstructive sleep apnea 09/16/2013   Morbid obesity ([place]) 09/16/2013   Personal history of breast cancer 02/09/2013   Hypertension    Cancer ([place])      PCP: Perrin Maltese, MD  REFERRING PROVIDER: Melrose Nakayama, MD  REFERRING DIAG: acute pain of right knee  THERAPY DIAG:  Muscle weakness (generalized)  Acute pain of right knee  Difficulty in walking, not elsewhere classified  Rationale for Evaluation and Treatment Rehabilitation  ONSET DATE: 06/24/2022  SUBJECTIVE:   SUBJECTIVE STATEMENT: Pt reports continue dpain and dysfunction in her R LE. Pt has s/s of nerve involvement AEB numbness in her foot on plantar aspect.  PERTINENT HISTORY:  Pt is s/p R knee arthroscopy on 06/24/22 by Dr. Rhona Raider and was DC home with RW. Pt to attend 3 PT visits prior to surgery for conservative management.   Pt arrives to OPPT with c/o R knee and R thigh pain (thigh>knee) secondary to recent procedure. Since procedure, pt notes she's having increased pain levels, and is managing pain with over the counter medication. Pt currently stating that she does not take stronger prescription medication as she is still driving. Pt notes she ambulates with hurry cane at home. Since procedure, pt notes increased difficulty with: prolonged walking/standing, car transfers, tub transfers, LB dressing, stairs, bed mobility, driving, cooking, cleaning, grocery shopping. Pt states that due to N/T radiating into R foot she is unable to feel break/pedal, and is considering having family assist with transportation.   PAIN:  Are you having pain? Yes: NPRS scale: 1/10 Pain location: R thigh and foot Pain description: dull/constant ache down leg, burning/tingling (foot)  PRECAUTIONS: Fall  WEIGHT BEARING RESTRICTIONS No  FALLS:  Has patient fallen in last 6 months? Yes. 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 PROCEDURE:  Right knee abrasion, chondroplasty of patellofemoral.  PATIENT SURVEYS:  FOTO 29%  COGNITION:  Overall cognitive status: Within functional limits for tasks assessed     SENSATION: TTP along R lateral thigh, anterior knee, joint space, and medial/lateral knee  EDEMA:  Circumferential: 50cm (R knee), 70cm (R thigh)   POSTURE: rounded shoulders and forward head  PALPATION: Increased edema noted to R thigh and knee; TTP along lateral thigh and knee. Unable to tolerate patellar mobilizations secondary to pain/edema.  LOWER EXTREMITY ROM:  Active ROM Right eval Left eval  Hip flexion 40deg WFL  Hip extension    Hip  abduction  [facility]  Hip adduction    Hip internal rotation  [facility]  Hip external rotation  [facility]  Knee flexion 30deg WFL  Knee extension 0deg [facility]  Ankle dorsiflexion WFL   Ankle plantarflexion WFL   Ankle inversion    Ankle eversion     (Blank rows = not tested)  LOWER EXTREMITY MMT:  MMT Right eval Left eval 9/21 PN   Hip flexion 2-  4+  Hip extension     Hip abduction     Hip adduction     Hip internal rotation     Hip external rotation     Knee flexion 3-  4+  Knee extension 3-  4-  Ankle dorsiflexion     Ankle plantarflexion     Ankle inversion     Ankle eversion      (Blank rows = not tested)   FUNCTIONAL TESTS:  5 times sit to stand: 36sec, demonstrating increased R hip ABD to avoid R knee flexion with descent. Increased time/effort for standing. BUE for support on arm rests.  GAIT: Distance walked: 37f x2 Assistive device utilized: Single point cane Level of assistance: SBA Comments: 3 point gait pattern; decreased R stance, R foot  clearance, L step length, and RUE swing    TODAY'S TREATMENT: 08/14/22 Physical therapy treatment session today consisted of completing assessment of goals and administration of testing as demonstrated and documented in flow sheet, treatment, and goals section of this note. Addition treatments may be found below.     Manual therapy  Patellar joint mobs Sup/Inf/medial/lateral: grade 3 for improved knee flexion/extension, x10 with 5 sec bouts/direction.  R vastus medialis/lateralis, hamstring and ITB and proximal M gastroc head rolling stick to address pain increased levels in this area as well as ischemic TP release and TP mobilizations to alleviate pain in these areas        PATIENT EDUCATION:  Education details:  Pt educated throughout session about proper posture and technique with exercises. Improved exercise technique, movement at target joints, use of target muscles after min to mod verbal, visual, tactile cues. understanding. Person educated: Patient Education method: Explanation, Demonstration, Tactile cues, Verbal cues, and Handouts Education comprehension: verbalized understanding   HOME EXERCISE PROGRAM: RLE heel slides, quad sets, SAQ, 3x10  Access Code: [email or web address]: [email or web address] Date: 07/21/2022 Prepared by: SAmalia Hailey Exercises - Supine Active Straight Leg Raise  - 1 x daily - 5-7 x weekly - 3 sets - 6 reps - Sidelying Hip Abduction  - 1 x daily - 5-7 x weekly - 3 sets - 6 reps - Seated Calf Stretch  - 1 x daily - 7 x weekly - 3 sets - 10 reps   ASSESSMENT:  CLINICAL IMPRESSION:   Patient presents to physical therapy for progress note this date.  Patient has made great progress with her right lower extremity strength, functional mobility, as evidenced by improved ability to manage stairs, her pain levels on average have decreased to frequently but is still having some pain, patient also demonstrates functional increase in strength as  evidenced by 5 times sit to stand as well as subjective increase in strength as evidenced by manual muscle testing on the right lower extremity.  Patient still is having intermittent pain with activities knowing movement of her right knee.  Patient is also still having discomfort in the musculature surrounding her knee which is also contributing to her pain.  Particular patient had pain with insertional medial hamstring, as well as  insertional vastus medialis this date.  Continue with manual therapy to address these areas of pain and discomfort in her muscle bellies.  Patient will continue to benefit from skilled physical therapy to address her impairments and improve her quality of lifePatient's condition has the potential to improve in response to therapy. Maximum improvement is yet to be obtained. The anticipated improvement is attainable and reasonable in a generally predictable time.   . OBJECTIVE IMPAIRMENTS Abnormal gait, decreased activity tolerance, decreased balance, decreased endurance, decreased mobility, difficulty walking, decreased ROM, decreased strength, increased edema, impaired perceived functional ability, impaired flexibility, improper body mechanics, postural dysfunction, and pain.   ACTIVITY LIMITATIONS lifting, bending, sitting, standing, squatting, sleeping, stairs, transfers, bed mobility, dressing, locomotion level, and car transfers, bed mobility  PARTICIPATION LIMITATIONS: meal prep, cleaning, laundry, driving, shopping, community activity, and occupation  PERSONAL FACTORS Age and 3+ comorbidities: HTN, hx CA, CHF  are also affecting patient's functional outcome.   REHAB POTENTIAL: Good  CLINICAL DECISION MAKING: Stable/uncomplicated  EVALUATION COMPLEXITY: Low   GOALS: Goals reviewed with patient? Yes  SHORT TERM GOALS: Target date: 09/04/2022  Pt will be independent and compliant with HEP in order to decrease knee pain and increase strength in order to improve  pain-free function at home and work. Baseline: HEP provided Goal status: MET   LONG TERM GOALS: Target date: 09/03/2022     Patient will increase FOTO score to equal to or greater than 71% to demonstrate statistically significant improvement in mobility and quality of life. Baseline: 61% 8/30: 49% 9/21: 59% Goal status: IN PROGRESS  2.  Patient will report a worst pain of 1-2/10 on VAS in R knee to improve tolerance with ADLs and reduced symptoms with activities. Baseline: 6-7/10; 8/30: 7/10 thigh pain and foot numb 9/21: 7/10 rolling in bed and knee pops Goal status: IN PROGRESS  3.  Pt will be able to complete 5x STS in </= 11sec indicating decreased risk of falls per this outcome measure based on pt appropriate age group. Baseline: 36sec 8/30: 32 seconds no UE support 9/21:18.5 sec  Goal status: IN PROGRESS  4.  Pt will demonstrate grossly 4+/5 RLE strength for improved independence with ADL's, IADL's, and functional mobility. Baseline: 2-/5 to 3-/5 8/30: see note 9/21:4+ all except knee extension which was 4- limited by anterior knee pain  Goal status: In Progress   4.   Patient will be independent with ascend/descend 5 steps using single UE in step over step pattern without LOB. Baseline: step to pattern with BUE support 9/21: B UE step over ascending and step to descending  Goal status: In progress   PLAN: PT FREQUENCY: 1-2x/week  PT DURATION: 6 weeks  PLANNED INTERVENTIONS: Therapeutic exercises, Therapeutic activity, Neuromuscular re-education, Balance training, Gait training, Patient/Family education, Self Care, Joint mobilization, Joint manipulation, Stair training, DME instructions, Dry Needling, Electrical stimulation, Cryotherapy, Moist heat, scar mobilization, Traction, Ultrasound, Ionotophoresis 88m/ml Dexamethasone, Manual therapy, and Re-evaluation  PLAN FOR NEXT SESSION: Continue with manual therapy as well as progressing right knee open chain strength as pain  allows.    CParticia LatherPT  4:55 PM,08/14/22 Physical Therapist - [facility]

## 2022-08-18 ENCOUNTER — Ambulatory Visit: Payer: PRIVATE HEALTH INSURANCE | Admitting: Physical Therapy

## 2022-08-18 NOTE — Therapy (Unsigned)
OUTPATIENT PHYSICAL THERAPY LOWER EXTREMITY TREATMENT/ Physical Therapy Progress Note   Dates of reporting period  07/07/22   to   08/14/22    Patient Name: Mandy Roberts MRN: [id number] DOB:Oct 12, 1961, 61 y.o., female Today's Date: 08/18/2022          Past Medical History:  Diagnosis Date   Atrial fibrillation [facility])    Breast cancer [facility]) 1990/1997    left mastectomy ,had recurrence in left suprclavic nodes several yrs ago-treated with radiation and chemo   CHF (congestive heart failure) ([place])    Colon polyp 3532   Complication of anesthesia    Difficult intubation    expectorated blood 3-4 days after surgery in 2011   Difficult intubation    hypoxia with endoscopy in 2016   GERD (gastroesophageal reflux disease)    History of chemotherapy    5-FU/AC in 1990 under the care of Dr. Oliva Bustard. pt also tx with tamoxifen and arimidex   History of radiation therapy    radiation treat at [facility]   Hypertension 2008   Personal history of chemotherapy 1990   BREAST CA   Personal history of malignant neoplasm of breast 1990    left mastectomy    Personal history of radiation therapy 1997   BREAST CA   Sinusitis    Sleep apnea    history of, went away after gastric surgery   Past Surgical History:  Procedure Laterality Date   [place]   COLONOSCOPY  2008, 2015   Dr. Jamal Collin   COLONOSCOPY WITH PROPOFOL N/A 10/12/2019   Procedure: COLONOSCOPY WITH PROPOFOL;  Surgeon: Robert Bellow, MD;  Location: [facility] ENDOSCOPY;  Service: Endoscopy;  Laterality: N/A;  DR BYRNETT WILL UPDATE H&P ON PROCEDURE DATE   DILATION AND CURETTAGE OF UTERUS     KNEE ARTHROPLASTY Left 09/07/2017   Procedure: COMPUTER ASSISTED TOTAL KNEE ARTHROPLASTY;  Surgeon: Dereck Leep, MD;  Location: [facility] ORS;  Service: Orthopedics;  Laterality: Left;   KNEE ARTHROSCOPY Bilateral    KNEE ARTHROSCOPY Right 06/24/2022   Procedure: RIGHT KNEE ARTHROSCOPY;  Surgeon: Melrose Nakayama, MD;  Location: [facility] ORS;  Service: Orthopedics;  Laterality: Right;   KNEE SURGERY Left 2009, 2010, 2012   LAPAROSCOPIC GASTRIC RESTRICTIVE DUODENAL PROCEDURE (DUODENAL SWITCH) N/A 04/17/2015   Procedure: LAPAROSCOPIC GASTRIC RESTRICTIVE DUODENAL PROCEDURE, single anastamosis ;  Surgeon: Bonner Puna, MD;  Location: [facility] ORS;  Service: General;  Laterality: N/A;   MASTECTOMY Left 1990   BREAST CA   TONSILLECTOMY     UPPER GI ENDOSCOPY N/A 04/17/2015   Procedure: UPPER GI ENDOSCOPY;  Surgeon: Bonner Puna, MD;  Location: [facility] ORS;  Service: General;  Laterality: N/A;   Patient Active Problem List   Diagnosis Date Noted   Dental abscess 05/27/2020   Abscess of mandible_left 05/26/2020   Thrombocytopenia ([place]) 05/26/2020   Facial cellulitis_left 05/26/2020   Varicose veins of both lower extremities with inflammation 05/12/2019   Chronic venous insufficiency 05/12/2019   Status post total left knee replacement 09/07/2017   Chronic bilateral thoracic back pain 09/01/2017   Neck pain 09/01/2017   Symptomatic mammary hypertrophy 09/01/2017   Difficult airway for intubation 04/09/2015   Chronic nasal congestion 04/09/2015   Atrial fibrillation ([place]) 09/16/2013   Obstructive sleep apnea 09/16/2013   Morbid obesity ([place]) 09/16/2013   Personal history of breast cancer 02/09/2013   Hypertension    Cancer ([place])     PCP: Perrin Maltese, MD  REFERRING PROVIDER: Melrose Nakayama, MD  REFERRING DIAG: acute pain of right knee  THERAPY DIAG:  Muscle weakness (generalized)  Acute pain of right knee  Difficulty in walking, not elsewhere classified  Rationale for Evaluation and Treatment Rehabilitation  ONSET DATE: 06/24/2022  SUBJECTIVE:   SUBJECTIVE STATEMENT: Pt reports continue dpain and dysfunction in her R LE. Pt has s/s of nerve involvement AEB numbness in her foot on plantar aspect.  PERTINENT HISTORY:  Pt is s/p R knee arthroscopy on 06/24/22 by Dr. Rhona Raider and was DC home with  RW. Pt to attend 3 PT visits prior to surgery for conservative management.   Pt arrives to OPPT with c/o R knee and R thigh pain (thigh>knee) secondary to recent procedure. Since procedure, pt notes she's having increased pain levels, and is managing pain with over the counter medication. Pt currently stating that she does not take stronger prescription medication as she is still driving. Pt notes she ambulates with hurry cane at home. Since procedure, pt notes increased difficulty with: prolonged walking/standing, car transfers, tub transfers, LB dressing, stairs, bed mobility, driving, cooking, cleaning, grocery shopping. Pt states that due to N/T radiating into R foot she is unable to feel break/pedal, and is considering having family assist with transportation.   PAIN:  Are you having pain? Yes: NPRS scale: 1/10 Pain location: R thigh and foot Pain description: dull/constant ache down leg, burning/tingling (foot)  PRECAUTIONS: Fall  WEIGHT BEARING RESTRICTIONS No  FALLS:  Has patient fallen in last 6 months? Yes. 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 PROCEDURE:  Right knee abrasion, chondroplasty of patellofemoral.  PATIENT SURVEYS:  FOTO 29%  COGNITION:  Overall cognitive status: Within functional limits for tasks assessed     SENSATION: TTP along R lateral thigh, anterior knee, joint space, and medial/lateral knee  EDEMA:   Circumferential: 50cm (R knee), 70cm (R thigh)   POSTURE: rounded shoulders and forward head  PALPATION: Increased edema noted to R thigh and knee; TTP along lateral thigh and knee. Unable to tolerate patellar mobilizations secondary to pain/edema.  LOWER EXTREMITY ROM:  Active ROM Right eval Left eval  Hip flexion 40deg WFL  Hip extension    Hip abduction  [facility]  Hip adduction    Hip internal rotation  [facility]  Hip external rotation  [facility]  Knee flexion 30deg WFL  Knee extension 0deg [facility]  Ankle dorsiflexion WFL   Ankle plantarflexion WFL   Ankle inversion    Ankle eversion     (Blank rows = not tested)  LOWER EXTREMITY MMT:  MMT Right eval Left eval 9/21 PN   Hip flexion 2-  4+  Hip extension     Hip abduction     Hip adduction     Hip internal rotation     Hip external rotation     Knee flexion 3-  4+  Knee extension 3-  4-  Ankle dorsiflexion     Ankle plantarflexion     Ankle inversion  Ankle eversion      (Blank rows = not tested)   FUNCTIONAL TESTS:  5 times sit to stand: 36sec, demonstrating increased R hip ABD to avoid R knee flexion with descent. Increased time/effort for standing. BUE for support on arm rests.  GAIT: Distance walked: 57f x2 Assistive device utilized: Single point cane Level of assistance: SBA Comments: 3 point gait pattern; decreased R stance, R foot clearance, L step length, and RUE swing    TODAY'S TREATMENT: 08/18/22      Manual therapy  Patellar joint mobs Sup/Inf/medial/lateral: grade 3 for improved knee flexion/extension, x10 with 5 sec bouts/direction.  R vastus medialis/lateralis, hamstring and ITB and proximal M gastroc head rolling stick to address pain increased levels in this area as well as ischemic TP release and TP mobilizations to alleviate pain in these areas   TherEx: Nustep 5 min at level 2 LE only for LE strenght and endurance.  Supine SAQ x 10 with 5 second holds, pain noted with large bolster but  improved with small bolster ( completed 2-3 reps with large bolster prior to transition)    SLR 3 way ( hip IR, neutral, ER) , good quad activation with this wihtout knee pain present. X 10 ea   SL SLR to target R hip abductors. X 10 ,  SL clamshell 2 x 10 reps  Standing heel raise x 15   Stair training: R LE step ups and eccentric step down with B UE assist x 12       PATIENT EDUCATION:  Education details:  Pt educated throughout session about proper posture and technique with exercises. Improved exercise technique, movement at target joints, use of target muscles after min to mod verbal, visual, tactile cues. understanding. Person educated: Patient Education method: Explanation, Demonstration, Tactile cues, Verbal cues, and Handouts Education comprehension: verbalized understanding   HOME EXERCISE PROGRAM: RLE heel slides, quad sets, SAQ, 3x10  Access Code: [email or web address] Date: 07/21/2022 Prepared by: SAmalia Hailey Exercises - Supine Active Straight Leg Raise  - 1 x daily - 5-7 x weekly - 3 sets - 6 reps - Sidelying Hip Abduction  - 1 x daily - 5-7 x weekly - 3 sets - 6 reps - Seated Calf Stretch  - 1 x daily - 7 x weekly - 3 sets - 10 reps   ASSESSMENT:  CLINICAL IMPRESSION:    OBJECTIVE IMPAIRMENTS Abnormal gait, decreased activity tolerance, decreased balance, decreased endurance, decreased mobility, difficulty walking, decreased ROM, decreased strength, increased edema, impaired perceived functional ability, impaired flexibility, improper body mechanics, postural dysfunction, and pain.   ACTIVITY LIMITATIONS lifting, bending, sitting, standing, squatting, sleeping, stairs, transfers, bed mobility, dressing, locomotion level, and car transfers, bed mobility  PARTICIPATION LIMITATIONS: meal prep, cleaning, laundry, driving, shopping, community activity, and occupation  PERSONAL FACTORS Age and 3+ comorbidities: HTN, hx CA, CHF  are  also affecting patient's functional outcome.   REHAB POTENTIAL: Good  CLINICAL DECISION MAKING: Stable/uncomplicated  EVALUATION COMPLEXITY: Low   GOALS: Goals reviewed with patient? Yes  SHORT TERM GOALS: Target date: 09/08/2022  Pt will be independent and compliant with HEP in order to decrease knee pain and increase strength in order to improve pain-free function at home and work. Baseline: HEP provided Goal status: MET   LONG TERM GOALS: Target date: 09/03/2022     Patient will increase FOTO score to equal to or greater than 71% to demonstrate statistically significant improvement in mobility and quality of life. Baseline: 61% 8/30:  49% 9/21: 59% Goal status: IN PROGRESS  2.  Patient will report a worst pain of 1-2/10 on VAS in R knee to improve tolerance with ADLs and reduced symptoms with activities. Baseline: 6-7/10; 8/30: 7/10 thigh pain and foot numb 9/21: 7/10 rolling in bed and knee pops Goal status: IN PROGRESS  3.  Pt will be able to complete 5x STS in </= 11sec indicating decreased risk of falls per this outcome measure based on pt appropriate age group. Baseline: 36sec 8/30: 32 seconds no UE support 9/21:18.5 sec  Goal status: IN PROGRESS  4.  Pt will demonstrate grossly 4+/5 RLE strength for improved independence with ADL's, IADL's, and functional mobility. Baseline: 2-/5 to 3-/5 8/30: see note 9/21:4+ all except knee extension which was 4- limited by anterior knee pain  Goal status: In Progress   4.   Patient will be independent with ascend/descend 5 steps using single UE in step over step pattern without LOB. Baseline: step to pattern with BUE support 9/21: B UE step over ascending and step to descending  Goal status: In progress   PLAN: PT FREQUENCY: 1-2x/week  PT DURATION: 6 weeks  PLANNED INTERVENTIONS: Therapeutic exercises, Therapeutic activity, Neuromuscular re-education, Balance training, Gait training, Patient/Family education, Self Care, Joint  mobilization, Joint manipulation, Stair training, DME instructions, Dry Needling, Electrical stimulation, Cryotherapy, Moist heat, scar mobilization, Traction, Ultrasound, Ionotophoresis 53m/ml Dexamethasone, Manual therapy, and Re-evaluation  PLAN FOR NEXT SESSION: Continue with manual therapy as well as progressing right knee open chain strength as pain allows.    CParticia LatherPT  4:37 PM,08/18/22 Physical Therapist - [facility]

## 2022-08-19 ENCOUNTER — Ambulatory Visit: Payer: PRIVATE HEALTH INSURANCE | Attending: Orthopaedic Surgery | Admitting: Physical Therapy

## 2022-08-19 ENCOUNTER — Encounter: Payer: Self-pay | Admitting: Physical Therapy

## 2022-08-19 DIAGNOSIS — R262 Difficulty in walking, not elsewhere classified: Secondary | ICD-10-CM | POA: Diagnosis present

## 2022-08-19 DIAGNOSIS — M25561 Pain in right knee: Secondary | ICD-10-CM | POA: Diagnosis present

## 2022-08-19 DIAGNOSIS — M6281 Muscle weakness (generalized): Secondary | ICD-10-CM | POA: Insufficient documentation

## 2022-08-20 ENCOUNTER — Ambulatory Visit: Payer: PRIVATE HEALTH INSURANCE | Admitting: Physical Therapy

## 2022-08-20 ENCOUNTER — Encounter: Payer: Self-pay | Admitting: Physical Therapy

## 2022-08-20 DIAGNOSIS — R262 Difficulty in walking, not elsewhere classified: Secondary | ICD-10-CM

## 2022-08-20 DIAGNOSIS — M6281 Muscle weakness (generalized): Secondary | ICD-10-CM | POA: Diagnosis not present

## 2022-08-20 DIAGNOSIS — M25561 Pain in right knee: Secondary | ICD-10-CM

## 2022-08-20 NOTE — Therapy (Signed)
OUTPATIENT PHYSICAL THERAPY LOWER EXTREMITY TREATMENT      Patient Name: Mandy Roberts MRN: [id number] DOB:12-27-1960, 61 y.o., female Today's Date: 08/20/2022   PT End of Session - 08/20/22 1124     Visit Number 12    Number of Visits 15    Date for PT Re-Evaluation 09/03/22    Authorization Time Period 8 additional visits 9/7-10/15    Authorization - Visit Number 11    Authorization - Number of Visits 15    Progress Note Due on Visit 20    PT Start Time 1105    PT Stop Time 1143    PT Time Calculation (min) 38 min    Activity Tolerance Patient tolerated treatment well    Behavior During Therapy [facility] for tasks assessed/performed                    Past Medical History:  Diagnosis Date   Atrial fibrillation ([place])    Breast cancer ([place]) 1990/1997    left mastectomy ,had recurrence in left suprclavic nodes several yrs ago-treated with radiation and chemo   CHF (congestive heart failure) ([place])    Colon polyp 7517   Complication of anesthesia    Difficult intubation    expectorated blood 3-4 days after surgery in 2011   Difficult intubation    hypoxia with endoscopy in 2016   GERD (gastroesophageal reflux disease)    History of chemotherapy    5-FU/AC in 1990 under the care of Dr. Oliva Bustard. pt also tx with tamoxifen and arimidex   History of radiation therapy    radiation treat at [facility]   Hypertension 2008   Personal history of chemotherapy 1990   BREAST CA   Personal history of malignant neoplasm of breast 1990    left mastectomy    Personal history of radiation therapy 1997   BREAST CA   Sinusitis    Sleep apnea    history of, went away after gastric surgery   Past Surgical History:  Procedure Laterality Date   [place]   COLONOSCOPY  2008, 2015   Dr. Jamal Collin   COLONOSCOPY WITH PROPOFOL N/A 10/12/2019   Procedure: COLONOSCOPY WITH PROPOFOL;  Surgeon: Robert Bellow, MD;  Location: [facility] ENDOSCOPY;  Service:  Endoscopy;  Laterality: N/A;  DR BYRNETT WILL UPDATE H&P ON PROCEDURE DATE   DILATION AND CURETTAGE OF UTERUS     KNEE ARTHROPLASTY Left 09/07/2017   Procedure: COMPUTER ASSISTED TOTAL KNEE ARTHROPLASTY;  Surgeon: Dereck Leep, MD;  Location: [facility] ORS;  Service: Orthopedics;  Laterality: Left;   KNEE ARTHROSCOPY Bilateral    KNEE ARTHROSCOPY Right 06/24/2022   Procedure: RIGHT KNEE ARTHROSCOPY;  Surgeon: Melrose Nakayama, MD;  Location: [facility] ORS;  Service: Orthopedics;  Laterality: Right;   KNEE SURGERY Left 2009, 2010, 2012   LAPAROSCOPIC GASTRIC RESTRICTIVE DUODENAL PROCEDURE (DUODENAL SWITCH) N/A 04/17/2015   Procedure: LAPAROSCOPIC GASTRIC RESTRICTIVE DUODENAL PROCEDURE, single anastamosis ;  Surgeon: Bonner Puna, MD;  Location: [facility] ORS;  Service: General;  Laterality: N/A;   MASTECTOMY Left 1990   BREAST CA   TONSILLECTOMY     UPPER GI ENDOSCOPY N/A 04/17/2015   Procedure: UPPER GI ENDOSCOPY;  Surgeon: Bonner Puna, MD;  Location: [facility] ORS;  Service: General;  Laterality: N/A;   Patient Active Problem List   Diagnosis Date Noted   Dental abscess 05/27/2020   Abscess of mandible_left 05/26/2020   Thrombocytopenia ([place]) 05/26/2020   Facial  cellulitis_left 05/26/2020   Varicose veins of both lower extremities with inflammation 05/12/2019   Chronic venous insufficiency 05/12/2019   Status post total left knee replacement 09/07/2017   Chronic bilateral thoracic back pain 09/01/2017   Neck pain 09/01/2017   Symptomatic mammary hypertrophy 09/01/2017   Difficult airway for intubation 04/09/2015   Chronic nasal congestion 04/09/2015   Atrial fibrillation ([place]) 09/16/2013   Obstructive sleep apnea 09/16/2013   Morbid obesity ([place]) 09/16/2013   Personal history of breast cancer 02/09/2013   Hypertension    Cancer ([place])     PCP: Perrin Maltese, MD  REFERRING PROVIDER: Melrose Nakayama, MD  REFERRING DIAG: acute pain of right knee  THERAPY DIAG:  Muscle weakness  (generalized)  Acute pain of right knee  Difficulty in walking, not elsewhere classified  Rationale for Evaluation and Treatment Rehabilitation  ONSET DATE: 06/24/2022  SUBJECTIVE:   SUBJECTIVE STATEMENT: Pt reports continue dpain and dysfunction in her R LE. Pt has s/s of nerve involvement AEB numbness in her foot on plantar aspect.  PERTINENT HISTORY:  Pt is s/p R knee arthroscopy on 06/24/22 by Dr. Rhona Raider and was DC home with RW. Pt to attend 3 PT visits prior to surgery for conservative management.   Pt arrives to OPPT with c/o R knee and R thigh pain (thigh>knee) secondary to recent procedure. Since procedure, pt notes she's having increased pain levels, and is managing pain with over the counter medication. Pt currently stating that she does not take stronger prescription medication as she is still driving. Pt notes she ambulates with hurry cane at home. Since procedure, pt notes increased difficulty with: prolonged walking/standing, car transfers, tub transfers, LB dressing, stairs, bed mobility, driving, cooking, cleaning, grocery shopping. Pt states that due to N/T radiating into R foot she is unable to feel break/pedal, and is considering having family assist with transportation.   PAIN:  Are you having pain? Yes: NPRS scale: 1/10 Pain location: R thigh and R knee Pain description: dull/constant ache down leg, burning/tingling (foot)  PRECAUTIONS: Fall  WEIGHT BEARING RESTRICTIONS No  FALLS:  Has patient fallen in last 6 months? Yes. 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 PROCEDURE:  Right knee abrasion, chondroplasty of patellofemoral.  PATIENT SURVEYS:  FOTO 29%  COGNITION:  Overall cognitive status: Within functional limits for tasks assessed     SENSATION: TTP along R lateral thigh, anterior knee, joint space, and medial/lateral knee  EDEMA:  Circumferential: 50cm (R knee), 70cm (R thigh)   POSTURE: rounded shoulders and forward head  PALPATION: Increased edema noted to R thigh and knee; TTP along lateral thigh and knee. Unable to tolerate patellar mobilizations secondary to pain/edema.  LOWER EXTREMITY ROM:  Active ROM Right eval Left eval  Hip flexion 40deg WFL  Hip extension    Hip abduction  [facility]  Hip adduction    Hip internal rotation  [facility]  Hip external rotation  [facility]  Knee flexion 30deg WFL  Knee extension 0deg [facility]  Ankle dorsiflexion WFL   Ankle plantarflexion WFL   Ankle inversion    Ankle eversion     (Blank rows = not tested)  LOWER EXTREMITY MMT:  MMT Right eval Left eval 9/21 PN   Hip flexion 2-  4+  Hip extension     Hip abduction     Hip adduction     Hip internal rotation     Hip external rotation     Knee flexion 3-  4+  Knee extension 3-  4-  Ankle dorsiflexion     Ankle plantarflexion     Ankle inversion     Ankle eversion      (Blank rows = not tested)   FUNCTIONAL TESTS:  5 times sit to stand: 36sec, demonstrating increased R hip ABD to avoid R knee flexion with descent. Increased time/effort for standing. BUE for support on arm rests.  GAIT: Distance walked: 25f x2 Assistive device utilized: Single point cane Level of assistance: SBA Comments: 3 point gait pattern; decreased R stance, R foot clearance, L step length, and RUE swing    TODAY'S TREATMENT: 08/20/22      Manual therapy  Patellar joint mobs Sup/Inf/medial/lateral: grade 3  for improved knee flexion/extension, x10 with 5 sec bouts/direction. In approx 20 degrees of flexion rom in area of pain,  R vastus medialis/lateralis, hamstring and ITB and proximal M gastroc head rolling stick to address pain increased levels in this area as well as ischemic TP release and TP mobilizations to alleviate pain in these areas   TherEx:  Supine isometric quad set in 20 degrees knee flexion.   -2*10*5 sec  -1 x 10 x 5 seconds, no pain or discomfort  SAQ 2 x 10 reps, no pain or discomfort. First time pain/ discomfort was not present during activity.   -added 2# AW x 10 reps LAQ 2 x 10 reps with 5 second hold. With 2# AW  Standing heel raise 2 x 15 with UE support   Stair training: R LE step ups and eccentric step down with B UE assist x 2*12, transitions to single UE support after a few reps  Banded side stepping - RTB x 15 reps each side - feels in hips but rates easy   Airex balance 1 LE on airex ( R) and 1 lE on step anterior , X 45 seconds with no LOB, added head turns and pt had more challenge  Dynamic balance airex side step up and off x 10 ea direction, no LOB, good stepping strategy and balance reactions        PATIENT EDUCATION:  Education details:  Pt educated throughout session about proper posture and technique with exercises. Improved exercise technique, movement at target joints, use of target muscles after min to mod verbal, visual, tactile cues. understanding. Person educated: Patient Education method: Explanation, Demonstration, Tactile cues, Verbal cues, and Handouts Education comprehension: verbalized understanding   HOME EXERCISE PROGRAM: RLE heel slides, quad sets, SAQ, 3x10  Access Code: [email or web address]: [email or web address] Date: 07/21/2022 Prepared by: SAmalia Hailey Exercises - Supine Active Straight Leg Raise  - 1 x daily - 5-7 x weekly - 3 sets - 6 reps - Sidelying Hip Abduction  - 1 x daily - 5-7 x weekly - 3 sets - 6  reps - Seated Calf Stretch  - 1 x daily - 7 x weekly - 3 sets - 10 reps   ASSESSMENT:  CLINICAL IMPRESSION: Continued with current plan of care as laid out  in evaluation and recent prior sessions. Pt remains motivated to advance progress toward goals in order to maximize independence and safety at home. Pt requires high level assistance and cuing for completion of exercises in order to provide adequate level of stimulation challenge while minimizing pain and discomfort when possible. Pt performs SAQ without pain for first time today following manual therapy and isometric exercise in previously painful range of motion. Pt progresses with closed chain functional exercises and balance/proprioceptive challenges this date with good response and no pain. Pt closely monitored throughout session pt response and to maximize patient safety during interventions. Pt continues to demonstrate progress toward goals AEB progression of interventions this date either in volume or intensity.     OBJECTIVE IMPAIRMENTS Abnormal gait, decreased activity tolerance, decreased balance, decreased endurance, decreased mobility, difficulty walking, decreased ROM, decreased strength, increased edema, impaired perceived functional ability, impaired flexibility, improper body mechanics, postural dysfunction, and pain.   ACTIVITY LIMITATIONS lifting, bending, sitting, standing, squatting, sleeping, stairs, transfers, bed mobility, dressing, locomotion level, and car transfers, bed mobility  PARTICIPATION LIMITATIONS: meal prep, cleaning, laundry, driving, shopping, community activity, and occupation  PERSONAL FACTORS Age and 3+ comorbidities: HTN, hx CA, CHF  are also affecting patient's functional outcome.   REHAB POTENTIAL: Good  CLINICAL DECISION MAKING: Stable/uncomplicated  EVALUATION COMPLEXITY: Low   GOALS: Goals reviewed with patient? Yes  SHORT TERM GOALS: Target date: 09/10/2022  Pt will be independent and  compliant with HEP in order to decrease knee pain and increase strength in order to improve pain-free function at home and work. Baseline: HEP provided Goal status: MET   LONG TERM GOALS: Target date: 09/03/2022     Patient will increase FOTO score to equal to or greater than 71% to demonstrate statistically significant improvement in mobility and quality of life. Baseline: 61% 8/30: 49% 9/21: 59% Goal status: IN PROGRESS  2.  Patient will report a worst pain of 1-2/10 on VAS in R knee to improve tolerance with ADLs and reduced symptoms with activities. Baseline: 6-7/10; 8/30: 7/10 thigh pain and foot numb 9/21: 7/10 rolling in bed and knee pops Goal status: IN PROGRESS  3.  Pt will be able to complete 5x STS in </= 11sec indicating decreased risk of falls per this outcome measure based on pt appropriate age group. Baseline: 36sec 8/30: 32 seconds no UE support 9/21:18.5 sec  Goal status: IN PROGRESS  4.  Pt will demonstrate grossly 4+/5 RLE strength for improved independence with ADL's, IADL's, and functional mobility. Baseline: 2-/5 to 3-/5 8/30: see note 9/21:4+ all except knee extension which was 4- limited by anterior knee pain  Goal status: In Progress   4.   Patient will be independent with ascend/descend 5 steps using single UE in step over step pattern without LOB. Baseline: step to pattern with BUE support 9/21: B UE step over ascending and step to descending  Goal status: In progress   PLAN: PT FREQUENCY: 1-2x/week  PT DURATION: 6 weeks  PLANNED INTERVENTIONS: Therapeutic exercises, Therapeutic activity, Neuromuscular re-education, Balance training, Gait training, Patient/Family education, Self Care, Joint mobilization, Joint manipulation, Stair training, DME instructions, Dry Needling, Electrical stimulation, Cryotherapy, Moist heat, scar mobilization, Traction, Ultrasound, Ionotophoresis 25m/ml Dexamethasone, Manual therapy, and Re-evaluation  PLAN FOR NEXT SESSION:  Continue with manual therapy as well as progressing right knee open chain strength as pain allows.    CParticia LatherPT  12:45 PM,08/20/22 Physical Therapist - [facility]

## 2022-08-22 ENCOUNTER — Other Ambulatory Visit: Payer: Self-pay

## 2022-08-22 NOTE — Therapy (Unsigned)
OUTPATIENT PHYSICAL THERAPY LOWER EXTREMITY TREATMENT      Patient Name: Mandy Roberts MRN: [id number] DOB:Sep 13, 1961, 61 y.o., female Today's Date: 08/22/2022            Past Medical History:  Diagnosis Date   Atrial fibrillation [facility])    Breast cancer [facility]) 1990/1997    left mastectomy ,had recurrence in left suprclavic nodes several yrs ago-treated with radiation and chemo   CHF (congestive heart failure) ([place])    Colon polyp [id number]   Complication of anesthesia    Difficult intubation    expectorated blood 3-4 days after surgery in 2011   Difficult intubation    hypoxia with endoscopy in 2016   GERD (gastroesophageal reflux disease)    History of chemotherapy    5-FU/AC in 1990 under the care of Dr. Oliva Bustard. pt also tx with tamoxifen and arimidex   History of radiation therapy    radiation treat at [facility]   Hypertension 2008   Personal history of chemotherapy 1990   BREAST CA   Personal history of malignant neoplasm of breast 1990    left mastectomy    Personal history of radiation therapy 1997   BREAST CA   Sinusitis    Sleep apnea    history of, went away after gastric surgery   Past Surgical History:  Procedure Laterality Date   [place]   COLONOSCOPY  2008, 2015   Dr. Jamal Collin   COLONOSCOPY WITH PROPOFOL N/A 10/12/2019   Procedure: COLONOSCOPY WITH PROPOFOL;  Surgeon: Robert Bellow, MD;  Location: [facility] ENDOSCOPY;  Service: Endoscopy;  Laterality: N/A;  DR BYRNETT WILL UPDATE H&P ON PROCEDURE DATE   DILATION AND CURETTAGE OF UTERUS     KNEE ARTHROPLASTY Left 09/07/2017   Procedure: COMPUTER ASSISTED TOTAL KNEE ARTHROPLASTY;  Surgeon: Dereck Leep, MD;  Location: [facility] ORS;  Service: Orthopedics;  Laterality: Left;   KNEE ARTHROSCOPY Bilateral    KNEE ARTHROSCOPY Right 06/24/2022   Procedure: RIGHT KNEE ARTHROSCOPY;  Surgeon: Melrose Nakayama, MD;  Location: [facility] ORS;  Service: Orthopedics;  Laterality: Right;    KNEE SURGERY Left 2009, 2010, 2012   LAPAROSCOPIC GASTRIC RESTRICTIVE DUODENAL PROCEDURE (DUODENAL SWITCH) N/A 04/17/2015   Procedure: LAPAROSCOPIC GASTRIC RESTRICTIVE DUODENAL PROCEDURE, single anastamosis ;  Surgeon: Bonner Puna, MD;  Location: [facility] ORS;  Service: General;  Laterality: N/A;   MASTECTOMY Left 1990   BREAST CA   TONSILLECTOMY     UPPER GI ENDOSCOPY N/A 04/17/2015   Procedure: UPPER GI ENDOSCOPY;  Surgeon: Bonner Puna, MD;  Location: [facility] ORS;  Service: General;  Laterality: N/A;   Patient Active Problem List   Diagnosis Date Noted   Dental abscess 05/27/2020   Abscess of mandible_left 05/26/2020   Thrombocytopenia ([place]) 05/26/2020   Facial cellulitis_left 05/26/2020   Varicose veins of both lower extremities with inflammation 05/12/2019   Chronic venous insufficiency 05/12/2019   Status post total left knee replacement 09/07/2017   Chronic bilateral thoracic back pain 09/01/2017   Neck pain 09/01/2017   Symptomatic mammary hypertrophy 09/01/2017   Difficult airway for intubation 04/09/2015   Chronic nasal congestion 04/09/2015   Atrial fibrillation ([place]) 09/16/2013   Obstructive sleep apnea 09/16/2013   Morbid obesity ([place]) 09/16/2013   Personal history of breast cancer 02/09/2013   Hypertension    Cancer ([place])     PCP: Perrin Maltese, MD  REFERRING PROVIDER: Melrose Nakayama, MD  REFERRING DIAG: acute pain of right knee  THERAPY DIAG:  Muscle weakness (generalized)  Acute pain of right knee  Difficulty in walking, not elsewhere classified  Rationale for Evaluation and Treatment Rehabilitation  ONSET DATE: 06/24/2022  SUBJECTIVE:   SUBJECTIVE STATEMENT: Pt reports continue dpain and dysfunction in her R LE. Pt has s/s of nerve involvement AEB numbness in her foot on plantar aspect.  PERTINENT HISTORY:  Pt is s/p R knee arthroscopy on 06/24/22 by Dr. Rhona Raider and was DC home with RW. Pt to attend 3 PT visits prior to surgery for conservative  management.   Pt arrives to OPPT with c/o R knee and R thigh pain (thigh>knee) secondary to recent procedure. Since procedure, pt notes she's having increased pain levels, and is managing pain with over the counter medication. Pt currently stating that she does not take stronger prescription medication as she is still driving. Pt notes she ambulates with hurry cane at home. Since procedure, pt notes increased difficulty with: prolonged walking/standing, car transfers, tub transfers, LB dressing, stairs, bed mobility, driving, cooking, cleaning, grocery shopping. Pt states that due to N/T radiating into R foot she is unable to feel break/pedal, and is considering having family assist with transportation.   PAIN:  Are you having pain? Yes: NPRS scale: 1/10 Pain location: R thigh and R knee Pain description: dull/constant ache down leg, burning/tingling (foot)  PRECAUTIONS: Fall  WEIGHT BEARING RESTRICTIONS No  FALLS:  Has patient fallen in last 6 months? Yes. 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 PROCEDURE:  Right knee abrasion, chondroplasty of patellofemoral.  PATIENT SURVEYS:  FOTO 29%  COGNITION:  Overall cognitive status: Within functional limits for tasks assessed     SENSATION: TTP along R lateral thigh, anterior knee, joint space, and medial/lateral knee  EDEMA:  Circumferential: 50cm (R knee), 70cm (R thigh)   POSTURE:  rounded shoulders and forward head  PALPATION: Increased edema noted to R thigh and knee; TTP along lateral thigh and knee. Unable to tolerate patellar mobilizations secondary to pain/edema.  LOWER EXTREMITY ROM:  Active ROM Right eval Left eval  Hip flexion 40deg WFL  Hip extension    Hip abduction  [facility]  Hip adduction    Hip internal rotation  [facility]  Hip external rotation  [facility]  Knee flexion 30deg WFL  Knee extension 0deg [facility]  Ankle dorsiflexion WFL   Ankle plantarflexion WFL   Ankle inversion    Ankle eversion     (Blank rows = not tested)  LOWER EXTREMITY MMT:  MMT Right eval Left eval 9/21 PN   Hip flexion 2-  4+  Hip extension     Hip abduction     Hip adduction     Hip internal rotation     Hip external rotation     Knee flexion 3-  4+  Knee extension 3-  4-  Ankle dorsiflexion     Ankle plantarflexion     Ankle inversion     Ankle eversion      (Blank rows =  not tested)   FUNCTIONAL TESTS:  5 times sit to stand: 36sec, demonstrating increased R hip ABD to avoid R knee flexion with descent. Increased time/effort for standing. BUE for support on arm rests.  GAIT: Distance walked: 70f x2 Assistive device utilized: Single point cane Level of assistance: SBA Comments: 3 point gait pattern; decreased R stance, R foot clearance, L step length, and RUE swing    TODAY'S TREATMENT: 08/22/22      Manual therapy  Patellar joint mobs Sup/Inf/medial/lateral: grade 3 for improved knee flexion/extension, x10 with 5 sec bouts/direction. In approx 20 degrees of flexion rom in area of pain,  R vastus medialis/lateralis, hamstring and ITB and proximal M gastroc head rolling stick to address pain increased levels in this area as well as ischemic TP release and TP mobilizations to alleviate pain in these areas   TherEx:  Supine isometric quad set in 20 degrees knee flexion.   -2*10*5 sec  -1 x 10 x 5 seconds, no pain or discomfort  SAQ 2 x 10 reps, no pain or  discomfort. First time pain/ discomfort was not present during activity.   -added 2# AW x 10 reps LAQ 2 x 10 reps with 5 second hold. With 2# AW  Standing heel raise 2 x 15 with UE support   Stair training: R LE step ups and eccentric step down with B UE assist x 2*12, transitions to single UE support after a few reps  Banded side stepping - RTB x 15 reps each side - feels in hips but rates easy   Airex balance 1 LE on airex ( R) and 1 lE on step anterior , X 45 seconds with no LOB, added head turns and pt had more challenge  Dynamic balance airex side step up and off x 10 ea direction, no LOB, good stepping strategy and balance reactions        PATIENT EDUCATION:  Education details:  Pt educated throughout session about proper posture and technique with exercises. Improved exercise technique, movement at target joints, use of target muscles after min to mod verbal, visual, tactile cues. understanding. Person educated: Patient Education method: Explanation, Demonstration, Tactile cues, Verbal cues, and Handouts Education comprehension: verbalized understanding   HOME EXERCISE PROGRAM: RLE heel slides, quad sets, SAQ, 3x10  Access Code: [email or web address]: [email or web address] Date: 07/21/2022 Prepared by: SAmalia Hailey Exercises - Supine Active Straight Leg Raise  - 1 x daily - 5-7 x weekly - 3 sets - 6 reps - Sidelying Hip Abduction  - 1 x daily - 5-7 x weekly - 3 sets - 6 reps - Seated Calf Stretch  - 1 x daily - 7 x weekly - 3 sets - 10 reps   ASSESSMENT:  CLINICAL IMPRESSION: Continued with current plan of care as laid out in evaluation and recent prior sessions. Pt remains motivated to advance progress toward goals in order to maximize independence and safety at home. Pt requires high level assistance and cuing for completion of exercises in order to provide adequate level of stimulation challenge while minimizing pain and discomfort when possible. Pt performs  SAQ without pain for first time today following manual therapy and isometric exercise in previously painful range of motion. Pt progresses with closed chain functional exercises and balance/proprioceptive challenges this date with good response and no pain. Pt closely monitored throughout session pt response and to maximize patient safety during interventions. Pt continues to demonstrate progress toward goals AEB progression of interventions this date either in  volume or intensity.     OBJECTIVE IMPAIRMENTS Abnormal gait, decreased activity tolerance, decreased balance, decreased endurance, decreased mobility, difficulty walking, decreased ROM, decreased strength, increased edema, impaired perceived functional ability, impaired flexibility, improper body mechanics, postural dysfunction, and pain.   ACTIVITY LIMITATIONS lifting, bending, sitting, standing, squatting, sleeping, stairs, transfers, bed mobility, dressing, locomotion level, and car transfers, bed mobility  PARTICIPATION LIMITATIONS: meal prep, cleaning, laundry, driving, shopping, community activity, and occupation  PERSONAL FACTORS Age and 3+ comorbidities: HTN, hx CA, CHF  are also affecting patient's functional outcome.   REHAB POTENTIAL: Good  CLINICAL DECISION MAKING: Stable/uncomplicated  EVALUATION COMPLEXITY: Low   GOALS: Goals reviewed with patient? Yes  SHORT TERM GOALS: Target date: 09/12/2022  Pt will be independent and compliant with HEP in order to decrease knee pain and increase strength in order to improve pain-free function at home and work. Baseline: HEP provided Goal status: MET   LONG TERM GOALS: Target date: 09/03/2022     Patient will increase FOTO score to equal to or greater than 71% to demonstrate statistically significant improvement in mobility and quality of life. Baseline: 61% 8/30: 49% 9/21: 59% Goal status: IN PROGRESS  2.  Patient will report a worst pain of 1-2/10 on VAS in R knee to  improve tolerance with ADLs and reduced symptoms with activities. Baseline: 6-7/10; 8/30: 7/10 thigh pain and foot numb 9/21: 7/10 rolling in bed and knee pops Goal status: IN PROGRESS  3.  Pt will be able to complete 5x STS in </= 11sec indicating decreased risk of falls per this outcome measure based on pt appropriate age group. Baseline: 36sec 8/30: 32 seconds no UE support 9/21:18.5 sec  Goal status: IN PROGRESS  4.  Pt will demonstrate grossly 4+/5 RLE strength for improved independence with ADL's, IADL's, and functional mobility. Baseline: 2-/5 to 3-/5 8/30: see note 9/21:4+ all except knee extension which was 4- limited by anterior knee pain  Goal status: In Progress   4.   Patient will be independent with ascend/descend 5 steps using single UE in step over step pattern without LOB. Baseline: step to pattern with BUE support 9/21: B UE step over ascending and step to descending  Goal status: In progress   PLAN: PT FREQUENCY: 1-2x/week  PT DURATION: 6 weeks  PLANNED INTERVENTIONS: Therapeutic exercises, Therapeutic activity, Neuromuscular re-education, Balance training, Gait training, Patient/Family education, Self Care, Joint mobilization, Joint manipulation, Stair training, DME instructions, Dry Needling, Electrical stimulation, Cryotherapy, Moist heat, scar mobilization, Traction, Ultrasound, Ionotophoresis 75m/ml Dexamethasone, Manual therapy, and Re-evaluation  PLAN FOR NEXT SESSION: Continue with manual therapy as well as progressing right knee open chain strength as pain allows.    CParticia LatherPT  8:30 AM,08/22/22 Physical Therapist - [facility]

## 2022-08-25 ENCOUNTER — Encounter: Payer: Self-pay | Admitting: Physical Therapy

## 2022-08-25 ENCOUNTER — Ambulatory Visit: Payer: PRIVATE HEALTH INSURANCE | Attending: Orthopaedic Surgery | Admitting: Physical Therapy

## 2022-08-25 DIAGNOSIS — M6281 Muscle weakness (generalized): Secondary | ICD-10-CM | POA: Diagnosis present

## 2022-08-25 DIAGNOSIS — R262 Difficulty in walking, not elsewhere classified: Secondary | ICD-10-CM | POA: Insufficient documentation

## 2022-08-25 DIAGNOSIS — M25561 Pain in right knee: Secondary | ICD-10-CM | POA: Diagnosis present

## 2022-08-28 ENCOUNTER — Other Ambulatory Visit: Payer: Self-pay

## 2022-08-28 ENCOUNTER — Ambulatory Visit: Payer: PRIVATE HEALTH INSURANCE | Admitting: Physical Therapy

## 2022-08-28 DIAGNOSIS — M6281 Muscle weakness (generalized): Secondary | ICD-10-CM | POA: Diagnosis not present

## 2022-08-28 DIAGNOSIS — M25561 Pain in right knee: Secondary | ICD-10-CM

## 2022-08-28 DIAGNOSIS — R262 Difficulty in walking, not elsewhere classified: Secondary | ICD-10-CM

## 2022-08-28 NOTE — Therapy (Signed)
OUTPATIENT PHYSICAL THERAPY LOWER EXTREMITY TREATMENT      Patient Name: Mandy Roberts MRN: [id number] DOB:[birth year], 61 y.o., female Today's Date: 08/28/2022   PT End of Session - 08/28/22 1352     Visit Number 14    Number of Visits 15    Date for PT Re-Evaluation 09/03/22    Authorization Time Period 8 additional visits 9/7-10/15    Authorization - Visit Number 68    Authorization - Number of Visits 15    Progress Note Due on Visit 67    PT Start Time 6333    PT Stop Time 5456    PT Time Calculation (min) 40 min    Activity Tolerance Patient tolerated treatment well    Behavior During Therapy [facility] for tasks assessed/performed                     Past Medical History:  Diagnosis Date   Atrial fibrillation ([place])    Breast cancer ([place]) 1990/1997    left mastectomy ,had recurrence in left suprclavic nodes several yrs ago-treated with radiation and chemo   CHF (congestive heart failure) ([place])    Colon polyp 2563   Complication of anesthesia    Difficult intubation    expectorated blood 3-4 days after surgery in 2011   Difficult intubation    hypoxia with endoscopy in 2016   GERD (gastroesophageal reflux disease)    History of chemotherapy    5-FU/AC in 1990 under the care of Dr. Oliva Bustard. pt also tx with tamoxifen and arimidex   History of radiation therapy    radiation treat at [facility]   Hypertension 2008   Personal history of chemotherapy 1990   BREAST CA   Personal history of malignant neoplasm of breast 1990    left mastectomy    Personal history of radiation therapy 1997   BREAST CA   Sinusitis    Sleep apnea    history of, went away after gastric surgery   Past Surgical History:  Procedure Laterality Date   [place]   COLONOSCOPY  2008, 2015   Dr. Jamal Collin   COLONOSCOPY WITH PROPOFOL N/A 10/12/2019   Procedure: COLONOSCOPY WITH PROPOFOL;  Surgeon: Robert Bellow, MD;  Location: [facility] ENDOSCOPY;   Service: Endoscopy;  Laterality: N/A;  DR BYRNETT WILL UPDATE H&P ON PROCEDURE DATE   DILATION AND CURETTAGE OF UTERUS     KNEE ARTHROPLASTY Left 09/07/2017   Procedure: COMPUTER ASSISTED TOTAL KNEE ARTHROPLASTY;  Surgeon: Dereck Leep, MD;  Location: [facility] ORS;  Service: Orthopedics;  Laterality: Left;   KNEE ARTHROSCOPY Bilateral    KNEE ARTHROSCOPY Right 06/24/2022   Procedure: RIGHT KNEE ARTHROSCOPY;  Surgeon: Melrose Nakayama, MD;  Location: [facility] ORS;  Service: Orthopedics;  Laterality: Right;   KNEE SURGERY Left 2009, 2010, 2012   LAPAROSCOPIC GASTRIC RESTRICTIVE DUODENAL PROCEDURE (DUODENAL SWITCH) N/A 04/17/2015   Procedure: LAPAROSCOPIC GASTRIC RESTRICTIVE DUODENAL PROCEDURE, single anastamosis ;  Surgeon: Bonner Puna, MD;  Location: [facility] ORS;  Service: General;  Laterality: N/A;   MASTECTOMY Left 1990   BREAST CA   TONSILLECTOMY     UPPER GI ENDOSCOPY N/A 04/17/2015   Procedure: UPPER GI ENDOSCOPY;  Surgeon: Bonner Puna, MD;  Location: [facility] ORS;  Service: General;  Laterality: N/A;   Patient Active Problem List   Diagnosis Date Noted   Dental abscess 05/27/2020   Abscess of mandible_left 05/26/2020   Thrombocytopenia ([place]) 05/26/2020  Facial cellulitis_left 05/26/2020   Varicose veins of both lower extremities with inflammation 05/12/2019   Chronic venous insufficiency 05/12/2019   Status post total left knee replacement 09/07/2017   Chronic bilateral thoracic back pain 09/01/2017   Neck pain 09/01/2017   Symptomatic mammary hypertrophy 09/01/2017   Difficult airway for intubation 04/09/2015   Chronic nasal congestion 04/09/2015   Atrial fibrillation ([place]) 09/16/2013   Obstructive sleep apnea 09/16/2013   Morbid obesity ([place]) 09/16/2013   Personal history of breast cancer 02/09/2013   Hypertension    Cancer ([place])     PCP: Perrin Maltese, MD  REFERRING PROVIDER: Melrose Nakayama, MD  REFERRING DIAG: acute pain of right knee  THERAPY DIAG:  Muscle weakness  (generalized)  Acute pain of right knee  Difficulty in walking, not elsewhere classified  Rationale for Evaluation and Treatment Rehabilitation  ONSET DATE: 06/24/2022  SUBJECTIVE:   SUBJECTIVE STATEMENT: Pt reports increased incidence of pain this date secondary to sitting at desk. Specific movements described to PT and movement analysis performed and adjustments made to limit her pain with this activity.  PERTINENT HISTORY:  Pt is s/p R knee arthroscopy on 06/24/22 by Dr. Rhona Raider and was DC home with RW. Pt to attend 3 PT visits prior to surgery for conservative management.   Pt arrives to OPPT with c/o R knee and R thigh pain (thigh>knee) secondary to recent procedure. Since procedure, pt notes she's having increased pain levels, and is managing pain with over the counter medication. Pt currently stating that she does not take stronger prescription medication as she is still driving. Pt notes she ambulates with hurry cane at home. Since procedure, pt notes increased difficulty with: prolonged walking/standing, car transfers, tub transfers, LB dressing, stairs, bed mobility, driving, cooking, cleaning, grocery shopping. Pt states that due to N/T radiating into R foot she is unable to feel break/pedal, and is considering having family assist with transportation.   PAIN:  Are you having pain? Yes: NPRS scale: 1/10 Pain location: R thigh and R knee Pain description: dull/constant ache down leg, burning/tingling (foot)  PRECAUTIONS: Fall  WEIGHT BEARING RESTRICTIONS No  FALLS:  Has patient fallen in last 6 months? Yes. 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 PROCEDURE:  Right knee abrasion, chondroplasty of patellofemoral.  PATIENT SURVEYS:  FOTO 29%  COGNITION:  Overall cognitive status: Within functional limits for tasks assessed     SENSATION: TTP along R lateral thigh, anterior knee, joint space, and medial/lateral knee  EDEMA:  Circumferential: 50cm (R knee), 70cm (R thigh)   POSTURE: rounded shoulders and forward head  PALPATION: Increased edema noted to R thigh and knee; TTP along lateral thigh and knee. Unable to tolerate patellar mobilizations secondary to pain/edema.  LOWER EXTREMITY ROM:  Active ROM Right eval Left eval  Hip flexion 40deg WFL  Hip extension    Hip abduction  [facility]  Hip adduction    Hip internal rotation  [facility]  Hip external rotation  [facility]  Knee flexion 30deg WFL  Knee extension 0deg [facility]  Ankle dorsiflexion WFL   Ankle plantarflexion WFL   Ankle inversion    Ankle eversion     (Blank rows = not tested)  LOWER EXTREMITY MMT:  MMT Right eval Left eval 9/21 PN   Hip flexion 2-  4+  Hip extension     Hip abduction     Hip adduction     Hip internal rotation     Hip external rotation     Knee flexion 3-  4+  Knee extension 3-  4-  Ankle dorsiflexion     Ankle plantarflexion     Ankle inversion     Ankle eversion      (Blank rows = not tested)   FUNCTIONAL TESTS:  5 times sit to stand: 36sec, demonstrating increased R hip ABD to avoid R knee flexion with descent. Increased time/effort for standing. BUE for support on arm rests.  GAIT: Distance walked: 80f x2 Assistive device utilized: Single point cane Level of assistance: SBA Comments: 3 point gait pattern; decreased R stance, R foot clearance, L step length, and RUE swing    TODAY'S  TREATMENT: 08/28/22      Manual therapy R vastus medialis/lateralis, hamstring, proximal gastroc and ITB and proximal M gastroc head rolling stick to address pain levels in this area as well as ischemic TP release and TP mobilizations to alleviate pain in these areas  -smaller but still prominent trigger points in these areas this date with less pain in this area  Performs patellar glides in each direction in approximately 50 degrees of knee flexion  TherEx:   SAQ 2 x 10 x 5 second holds with 3# AW donned, pain noted around rep 8 on first and second set in conjunction with instability/ grinding below patellar surface   Instruction in how to properly turn in chair to avoid excessive medial, lateral, and torsional stress on R knee   Ice pack on knee at end of session ( no charge) x 10 minutes to alleviate inflammation and pain secondary to work today ( pt has to return to work following therapy)  PT interventions limited secondary to pain this date due to working on floor for first time and having to turn in chair ( torsion stress as described by pt)      PATIENT EDUCATION:  Education details:  Pt educated throughout session about proper posture and technique with exercises. Improved exercise technique, movement at target joints, use of target muscles after min to mod verbal, visual, tactile cues. understanding. Person educated: Patient Education method: Explanation, Demonstration, Tactile cues, Verbal cues, and Handouts Education comprehension: verbalized understanding   HOME EXERCISE PROGRAM: RLE heel slides, quad sets, SAQ, 3x10  Access Code: [email or web address]: [email or web address] Date: 07/21/2022 Prepared by: SAmalia Hailey Exercises - Supine Active Straight Leg Raise  - 1 x daily - 5-7 x weekly - 3 sets - 6 reps - Sidelying Hip Abduction  - 1 x daily - 5-7 x weekly - 3 sets - 6 reps - Seated Calf Stretch  - 1 x daily - 7 x weekly - 3 sets - 10  reps   ASSESSMENT:  CLINICAL IMPRESSION: Pt presents to PT with increased pain and discomfort this date secondary to 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 Pt performing twisting in chair placing increased lateral, medial and torsion stress on knee. Pt instructed and practices ways to avoid this stress and demonstrated  good response. PT session cut a little short to allow pt to utilize ice therapy to reduce inflammation as interventions were more painful this date. Pt will continue to benefit from skilled physical therapy intervention to address impairments, improve QOL, and attain therapy goals.      OBJECTIVE IMPAIRMENTS Abnormal gait, decreased activity tolerance, decreased balance, decreased endurance, decreased mobility, difficulty walking, decreased ROM, decreased strength, increased edema, impaired perceived functional ability, impaired flexibility, improper body mechanics, postural dysfunction, and pain.   ACTIVITY LIMITATIONS lifting, bending, sitting, standing, squatting, sleeping, stairs, transfers, bed mobility, dressing, locomotion level, and car transfers, bed mobility  PARTICIPATION LIMITATIONS: meal prep, cleaning, laundry, driving, shopping, community activity, and occupation  PERSONAL FACTORS Age and 3+ comorbidities: HTN, hx CA, CHF  are also affecting patient's functional outcome.   REHAB POTENTIAL: Good  CLINICAL DECISION MAKING: Stable/uncomplicated  EVALUATION COMPLEXITY: Low   GOALS: Goals reviewed with patient? Yes  SHORT TERM GOALS: Target date: 09/18/2022  Pt will be independent and compliant with HEP in order to decrease knee pain and increase strength in order to improve pain-free function at home and work. Baseline: HEP provided Goal status: MET   LONG TERM GOALS: Target date: 09/03/2022     Patient will increase FOTO score to equal to or greater than 71% to demonstrate statistically significant improvement in mobility and  quality of life. Baseline: 61% 8/30: 49% 9/21: 59% Goal status: IN PROGRESS  2.  Patient will report a worst pain of 1-2/10 on VAS in R knee to improve tolerance with ADLs and reduced symptoms with activities. Baseline: 6-7/10; 8/30: 7/10 thigh pain and foot numb 9/21: 7/10 rolling in bed and knee pops Goal status: IN PROGRESS  3.  Pt will be able to complete 5x STS in </= 11sec indicating decreased risk of falls per this outcome measure based on pt appropriate age group. Baseline: 36sec 8/30: 32 seconds no UE support 9/21:18.5 sec  Goal status: IN PROGRESS  4.  Pt will demonstrate grossly 4+/5 RLE strength for improved independence with ADL's, IADL's, and functional mobility. Baseline: 2-/5 to 3-/5 8/30: see note 9/21:4+ all except knee extension which was 4- limited by anterior knee pain  Goal status: In Progress   4.   Patient will be independent with ascend/descend 5 steps using single UE in step over step pattern without LOB. Baseline: step to pattern with BUE support 9/21: B UE step over ascending and step to descending  Goal status: In progress   PLAN: PT FREQUENCY: 1-2x/week  PT DURATION: 6 weeks  PLANNED INTERVENTIONS: Therapeutic exercises, Therapeutic activity, Neuromuscular re-education, Balance training, Gait training, Patient/Family education, Self Care, Joint mobilization, Joint manipulation, Stair training, DME instructions, Dry Needling, Electrical stimulation, Cryotherapy, Moist heat, scar mobilization, Traction, Ultrasound, Ionotophoresis 60m/ml Dexamethasone, Manual therapy, and Re-evaluation  PLAN FOR NEXT SESSION: Continue with manual therapy as well as progressing right knee open chain and closed chain strength as pain allows.    CParticia LatherPT  3:44 PM,08/28/22 Physical Therapist - [facility]

## 2022-08-29 ENCOUNTER — Other Ambulatory Visit: Payer: Self-pay

## 2022-09-02 ENCOUNTER — Encounter: Payer: Self-pay | Admitting: Physical Therapy

## 2022-09-02 ENCOUNTER — Ambulatory Visit: Payer: PRIVATE HEALTH INSURANCE | Attending: Orthopaedic Surgery | Admitting: Physical Therapy

## 2022-09-02 DIAGNOSIS — M25561 Pain in right knee: Secondary | ICD-10-CM | POA: Insufficient documentation

## 2022-09-02 DIAGNOSIS — R262 Difficulty in walking, not elsewhere classified: Secondary | ICD-10-CM | POA: Diagnosis not present

## 2022-09-02 DIAGNOSIS — M6281 Muscle weakness (generalized): Secondary | ICD-10-CM | POA: Insufficient documentation

## 2022-09-02 NOTE — Therapy (Signed)
OUTPATIENT PHYSICAL THERAPY LOWER EXTREMITY TREATMENT      Patient Name: Mandy Roberts MRN: [id number] DOB:January 20, 1961, 61 y.o., female Today's Date: 09/02/2022   PT End of Session - 09/02/22 1439     Visit Number 15    Number of Visits 15    Date for PT Re-Evaluation 09/03/22    Authorization Time Period 8 additional visits 9/7-10/15    Authorization - Visit Number 75    Authorization - Number of Visits 15    Progress Note Due on Visit 69    PT Start Time 4098    PT Stop Time 1191    PT Time Calculation (min) 38 min    Activity Tolerance Patient tolerated treatment well;Patient limited by pain    Behavior During Therapy [facility] for tasks assessed/performed                      Past Medical History:  Diagnosis Date   Atrial fibrillation ([place])    Breast cancer ([place]) 1990/1997    left mastectomy ,had recurrence in left suprclavic nodes several yrs ago-treated with radiation and chemo   CHF (congestive heart failure) ([place])    Colon polyp 4782   Complication of anesthesia    Difficult intubation    expectorated blood 3-4 days after surgery in 2011   Difficult intubation    hypoxia with endoscopy in 2016   GERD (gastroesophageal reflux disease)    History of chemotherapy    5-FU/AC in 1990 under the care of Dr. Oliva Bustard. pt also tx with tamoxifen and arimidex   History of radiation therapy    radiation treat at [facility]   Hypertension 2008   Personal history of chemotherapy 1990   BREAST CA   Personal history of malignant neoplasm of breast 1990    left mastectomy    Personal history of radiation therapy 1997   BREAST CA   Sinusitis    Sleep apnea    history of, went away after gastric surgery   Past Surgical History:  Procedure Laterality Date   [place]   COLONOSCOPY  2008, 2015   Dr. Jamal Collin   COLONOSCOPY WITH PROPOFOL N/A 10/12/2019   Procedure: COLONOSCOPY WITH PROPOFOL;  Surgeon: Robert Bellow, MD;   Location: [facility] ENDOSCOPY;  Service: Endoscopy;  Laterality: N/A;  DR BYRNETT WILL UPDATE H&P ON PROCEDURE DATE   DILATION AND CURETTAGE OF UTERUS     KNEE ARTHROPLASTY Left 09/07/2017   Procedure: COMPUTER ASSISTED TOTAL KNEE ARTHROPLASTY;  Surgeon: Dereck Leep, MD;  Location: [facility] ORS;  Service: Orthopedics;  Laterality: Left;   KNEE ARTHROSCOPY Bilateral    KNEE ARTHROSCOPY Right 06/24/2022   Procedure: RIGHT KNEE ARTHROSCOPY;  Surgeon: Melrose Nakayama, MD;  Location: [facility] ORS;  Service: Orthopedics;  Laterality: Right;   KNEE SURGERY Left 2009, 2010, 2012   LAPAROSCOPIC GASTRIC RESTRICTIVE DUODENAL PROCEDURE (DUODENAL SWITCH) N/A 04/17/2015   Procedure: LAPAROSCOPIC GASTRIC RESTRICTIVE DUODENAL PROCEDURE, single anastamosis ;  Surgeon: Bonner Puna, MD;  Location: [facility] ORS;  Service: General;  Laterality: N/A;   MASTECTOMY Left 1990   BREAST CA   TONSILLECTOMY     UPPER GI ENDOSCOPY N/A 04/17/2015   Procedure: UPPER GI ENDOSCOPY;  Surgeon: Bonner Puna, MD;  Location: [facility] ORS;  Service: General;  Laterality: N/A;   Patient Active Problem List   Diagnosis Date Noted   Dental abscess 05/27/2020   Abscess of mandible_left 05/26/2020   Thrombocytopenia (  [place]) 05/26/2020   Facial cellulitis_left 05/26/2020   Varicose veins of both lower extremities with inflammation 05/12/2019   Chronic venous insufficiency 05/12/2019   Status post total left knee replacement 09/07/2017   Chronic bilateral thoracic back pain 09/01/2017   Neck pain 09/01/2017   Symptomatic mammary hypertrophy 09/01/2017   Difficult airway for intubation 04/09/2015   Chronic nasal congestion 04/09/2015   Atrial fibrillation ([place]) 09/16/2013   Obstructive sleep apnea 09/16/2013   Morbid obesity ([place]) 09/16/2013   Personal history of breast cancer 02/09/2013   Hypertension    Cancer ([place])     PCP: Perrin Maltese, MD  REFERRING PROVIDER: Melrose Nakayama, MD  REFERRING DIAG: acute pain of right knee  THERAPY DIAG:   Muscle weakness (generalized)  Acute pain of right knee  Difficulty in walking, not elsewhere classified  Rationale for Evaluation and Treatment Rehabilitation  ONSET DATE: 06/24/2022  SUBJECTIVE:   SUBJECTIVE STATEMENT: Pt reports improved pain at desk since last intervention date. Pt has had her boyfriend help with rolling stick and had some pain and muscle spasm but felt it was helping. Pt reports her tenderness and soreness in her R LE is still present and she continues to have intermittent pain and discomfort in her right lower extremity there is movements particularly movements involving a twisting motion of the knee such as rolling around in bed or maneuvering in her desk chair. PERTINENT HISTORY:  Pt is s/p R knee arthroscopy on 06/24/22 by Dr. Rhona Raider and was DC home with RW. Pt to attend 3 PT visits prior to surgery for conservative management.   Pt arrives to OPPT with c/o R knee and R thigh pain (thigh>knee) secondary to recent procedure. Since procedure, pt notes she's having increased pain levels, and is managing pain with over the counter medication. Pt currently stating that she does not take stronger prescription medication as she is still driving. Pt notes she ambulates with hurry cane at home. Since procedure, pt notes increased difficulty with: prolonged walking/standing, car transfers, tub transfers, LB dressing, stairs, bed mobility, driving, cooking, cleaning, grocery shopping. Pt states that due to N/T radiating into R foot she is unable to feel break/pedal, and is considering having family assist with transportation.   PAIN:  Are you having pain? Yes: NPRS scale: 1/10 Pain location: R thigh and R knee Pain description: dull/constant ache down leg, burning/tingling (foot)  PRECAUTIONS: Fall  WEIGHT BEARING RESTRICTIONS No  FALLS:  Has patient fallen in last 6 months? Yes. 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 PROCEDURE:  Right knee abrasion, chondroplasty of patellofemoral.  PATIENT SURVEYS:  FOTO 29%  COGNITION:  Overall cognitive status: Within functional limits for tasks assessed     SENSATION: TTP along R lateral thigh, anterior knee, joint space, and medial/lateral knee  EDEMA:  Circumferential: 50cm (R knee), 70cm (R thigh)   POSTURE: rounded shoulders and forward head  PALPATION: Increased edema noted to R thigh and knee; TTP along lateral thigh and  knee. Unable to tolerate patellar mobilizations secondary to pain/edema.  LOWER EXTREMITY ROM:  Active ROM Right eval Left eval  Hip flexion 40deg WFL  Hip extension    Hip abduction  [facility]  Hip adduction    Hip internal rotation  [facility]  Hip external rotation  [facility]  Knee flexion 30deg WFL  Knee extension 0deg [facility]  Ankle dorsiflexion WFL   Ankle plantarflexion WFL   Ankle inversion    Ankle eversion     (Blank rows = not tested)  LOWER EXTREMITY MMT:  MMT Right eval Left eval 9/21 PN   Hip flexion 2-  4+  Hip extension     Hip abduction     Hip adduction     Hip internal rotation     Hip external rotation     Knee flexion 3-  4+  Knee extension 3-  4-  Ankle dorsiflexion     Ankle plantarflexion     Ankle inversion     Ankle eversion      (Blank rows = not tested)   FUNCTIONAL TESTS:  5 times sit to stand: 36sec, demonstrating increased R hip ABD to avoid R knee flexion with descent. Increased time/effort  for standing. BUE for support on arm rests.  GAIT: Distance walked: 58f x2 Assistive device utilized: Single point cane Level of assistance: SBA Comments: 3 point gait pattern; decreased R stance, R foot clearance, L step length, and RUE swing    TODAY'S TREATMENT: 09/02/22   Patient presents with full knee range of motion but does have some grinding along patellofemoral contact achieving this range of motion at approximately 80 degrees of knee flexion.  Measured in supine position.   Manual therapy R vastus medialis and proximal gastroc head ischemic trigger point release to address pain levels in this areaand TP mobilizations to alleviate pain in these areas  -Area of pain significantly smaller following treatment patient reports decreased pain and tightness following treatment as well.  Performs patellar glides in each direction in approximately 50 and 90 degrees of knee flexion  TherEx:  Began with some strength testing with leg in approximately 45 degrees of knee flexion patient has significant grinding and stability performing isometric contraction against physical therapist resistance at this range with both palpable grinding and pain and discomfort felt  SAQ 2 x 10 x 5 second holds with 3# AW donned, pain noted around rep 8 on first and second set in conjunction with instability/ grinding below patellar surface along patellofemoral joint -Physical therapist provides manual stimulation of patellofemoral joint assisting with superior glide of patella and this did reduce patient's signs symptoms of instability  Long arc quad with 3 pound ankle weight donned x10 with instability and grinding in the patellofemoral joint following repetition 8 that increased to repetition 10  Anterior step ups leading with right lower extremity and lateral step ups leading with right lower extremity.  Patient requires upper extremity support on first few repetitions but is able to complete without  upper extremity support for all remaining repetitions.  Right lower extremity gastrocnemius stretch in standing split squat position x45-second hold  Note: Portions of this document were prepared using Dragon voice recognition software and although reviewed may contain unintentional dictation errors in syntax, grammar, or spelling.     PATIENT EDUCATION:  Education details:  Pt educated throughout session about proper posture and technique with exercises. Improved exercise technique, movement at target joints, use of target muscles after min to mod verbal, visual,  tactile cues. understanding. Person educated: Patient Education method: Explanation, Demonstration, Tactile cues, Verbal cues, and Handouts Education comprehension: verbalized understanding   HOME EXERCISE PROGRAM: RLE heel slides, quad sets, SAQ, 3x10  Access Code: [email or web address] URL: [email or web address] Date: 07/21/2022 Prepared by: Amalia Hailey  Exercises - Supine Active Straight Leg Raise  - 1 x daily - 5-7 x weekly - 3 sets - 6 reps - Sidelying Hip Abduction  - 1 x daily - 5-7 x weekly - 3 sets - 6 reps - Seated Calf Stretch  - 1 x daily - 7 x weekly - 3 sets - 10 reps   ASSESSMENT:  CLINICAL IMPRESSION: Patient presents to physical therapy with excellent motivation for completion of physical therapy interventions.  Patient continues to report intermittent pain and instability as well as grinding felt in her right lower extremity with various activities particularly activities involving twisting motion with her knee in flexed postures including laying in bed as well as when sitting at her desk and rotating in a swivel chair.  Patient has made slow progress of physical therapy but has improved but still is not improving at a normal rate for other individuals completing similar surgery.  Patient initially presented with significant pain and ecchymosis in her right lower extremity which did limit some of her  interventions.  Following the improvement of this patient continues to have right lower extremity had the symptoms of instability and grinding in her right knee with various activities primarily with open chain knee strengthening including short arc and long arc quads.  Patient does feel significant pain discomfort with isometric contraction of quad musculature in supine position with knees slightly flexed between 40 and 30 degrees of flexion.  Pt will continue to benefit from skilled physical therapy intervention to address impairments, improve QOL, and attain therapy goals.      OBJECTIVE IMPAIRMENTS Abnormal gait, decreased activity tolerance, decreased balance, decreased endurance, decreased mobility, difficulty walking, decreased ROM, decreased strength, increased edema, impaired perceived functional ability, impaired flexibility, improper body mechanics, postural dysfunction, and pain.   ACTIVITY LIMITATIONS lifting, bending, sitting, standing, squatting, sleeping, stairs, transfers, bed mobility, dressing, locomotion level, and car transfers, bed mobility  PARTICIPATION LIMITATIONS: meal prep, cleaning, laundry, driving, shopping, community activity, and occupation  PERSONAL FACTORS Age and 3+ comorbidities: HTN, hx CA, CHF  are also affecting patient's functional outcome.   REHAB POTENTIAL: Good  CLINICAL DECISION MAKING: Stable/uncomplicated  EVALUATION COMPLEXITY: Low   GOALS: Goals reviewed with patient? Yes  SHORT TERM GOALS: Target date: 09/23/2022  Pt will be independent and compliant with HEP in order to decrease knee pain and increase strength in order to improve pain-free function at home and work. Baseline: HEP provided Goal status: MET   LONG TERM GOALS: Target date: 09/03/2022     Patient will increase FOTO score to equal to or greater than 71% to demonstrate statistically significant improvement in mobility and quality of life. Baseline: 61% 8/30: 49% 9/21:  59% Goal status: IN PROGRESS  2.  Patient will report a worst pain of 1-2/10 on VAS in R knee to improve tolerance with ADLs and reduced symptoms with activities. Baseline: 6-7/10; 8/30: 7/10 thigh pain and foot numb 9/21: 7/10 rolling in bed and knee pops Goal status: IN PROGRESS  3.  Pt will be able to complete 5x STS in </= 11sec indicating decreased risk of falls per this outcome measure based on pt appropriate age group. Baseline: 36sec 8/30: 32 seconds no UE support 9/21:18.5 sec  Goal status: IN PROGRESS  4.  Pt will demonstrate grossly 4+/5 RLE strength for improved independence with ADL's, IADL's, and functional mobility. Baseline: 2-/5 to 3-/5 8/30: see note 9/21:4+ all except knee extension which was 4- limited by anterior knee pain  Goal status: In Progress   4.   Patient will be independent with ascend/descend 5 steps using single UE in step over step pattern without LOB. Baseline: step to pattern with BUE support 9/21: B UE step over ascending and step to descending  Goal status: In progress   PLAN: PT FREQUENCY: 1-2x/week  PT DURATION: 6 weeks  PLANNED INTERVENTIONS: Therapeutic exercises, Therapeutic activity, Neuromuscular re-education, Balance training, Gait training, Patient/Family education, Self Care, Joint mobilization, Joint manipulation, Stair training, DME instructions, Dry Needling, Electrical stimulation, Cryotherapy, Moist heat, scar mobilization, Traction, Ultrasound, Ionotophoresis 44m/ml Dexamethasone, Manual therapy, and Re-evaluation  PLAN FOR NEXT SESSION: Continue with manual therapy as well as progressing right knee open chain and closed chain strength as pain allows.    CParticia LatherPT  3:22 PM,09/02/22 Physical Therapist [facility]

## 2022-09-21 ENCOUNTER — Other Ambulatory Visit: Payer: Self-pay

## 2022-09-22 ENCOUNTER — Encounter (INDEPENDENT_AMBULATORY_CARE_PROVIDER_SITE_OTHER): Payer: Self-pay

## 2022-09-22 ENCOUNTER — Other Ambulatory Visit: Payer: Self-pay

## 2022-09-22 MED ORDER — ALBUTEROL SULFATE HFA 108 (90 BASE) MCG/ACT IN AERS
INHALATION_SPRAY | RESPIRATORY_TRACT | 1 refills | Status: AC
  Filled 2022-09-22: qty 6.7, 25d supply, fill #0
  Filled 2022-10-13: qty 6.7, 25d supply, fill #1

## 2022-09-23 ENCOUNTER — Other Ambulatory Visit: Payer: Self-pay

## 2022-09-29 ENCOUNTER — Other Ambulatory Visit: Payer: Self-pay | Admitting: General Surgery

## 2022-09-29 DIAGNOSIS — Z1231 Encounter for screening mammogram for malignant neoplasm of breast: Secondary | ICD-10-CM

## 2022-09-30 ENCOUNTER — Encounter: Payer: Self-pay | Admitting: Physical Therapy

## 2022-09-30 ENCOUNTER — Ambulatory Visit: Payer: PRIVATE HEALTH INSURANCE | Attending: Orthopaedic Surgery | Admitting: Physical Therapy

## 2022-09-30 DIAGNOSIS — M6281 Muscle weakness (generalized): Secondary | ICD-10-CM | POA: Insufficient documentation

## 2022-09-30 DIAGNOSIS — R262 Difficulty in walking, not elsewhere classified: Secondary | ICD-10-CM | POA: Diagnosis present

## 2022-09-30 DIAGNOSIS — M25561 Pain in right knee: Secondary | ICD-10-CM | POA: Insufficient documentation

## 2022-09-30 NOTE — Therapy (Addendum)
OUTPATIENT PHYSICAL THERAPY LOWER EXTREMITY Re- evaluation       Patient Name: Mandy Roberts MRN: [id number] DOB:02/04/1961, 61 y.o., female Today's Date: 09/30/2022   PT End of Session - 09/30/22 1313     Visit Number 17    Number of Visits 24    Date for PT Re-Evaluation 09/03/22    Authorization Time Period Cert 11/30/49-    Authorization - Visit Number 14    Authorization - Number of Visits 15    Progress Note Due on Visit 20    PT Start Time 1310    PT Stop Time 1350    PT Time Calculation (min) 40 min    Activity Tolerance Patient tolerated treatment well;Patient limited by pain    Behavior During Therapy [facility] for tasks assessed/performed                       Past Medical History:  Diagnosis Date   Atrial fibrillation ([place])    Breast cancer ([place]) 1990/1997    left mastectomy ,had recurrence in left suprclavic nodes several yrs ago-treated with radiation and chemo   CHF (congestive heart failure) ([place])    Colon polyp 0258   Complication of anesthesia    Difficult intubation    expectorated blood 3-4 days after surgery in 2011   Difficult intubation    hypoxia with endoscopy in 2016   GERD (gastroesophageal reflux disease)    History of chemotherapy    5-FU/AC in 1990 under the care of Dr. Oliva Bustard. pt also tx with tamoxifen and arimidex   History of radiation therapy    radiation treat at [facility]   Hypertension 2008   Personal history of chemotherapy 1990   BREAST CA   Personal history of malignant neoplasm of breast 1990    left mastectomy    Personal history of radiation therapy 1997   BREAST CA   Sinusitis    Sleep apnea    history of, went away after gastric surgery   Past Surgical History:  Procedure Laterality Date   [place]   COLONOSCOPY  2008, 2015   Dr. Jamal Collin   COLONOSCOPY WITH PROPOFOL N/A 10/12/2019   Procedure: COLONOSCOPY WITH PROPOFOL;  Surgeon: Robert Bellow, MD;  Location: [facility]  ENDOSCOPY;  Service: Endoscopy;  Laterality: N/A;  DR BYRNETT WILL UPDATE H&P ON PROCEDURE DATE   DILATION AND CURETTAGE OF UTERUS     KNEE ARTHROPLASTY Left 09/07/2017   Procedure: COMPUTER ASSISTED TOTAL KNEE ARTHROPLASTY;  Surgeon: Dereck Leep, MD;  Location: [facility] ORS;  Service: Orthopedics;  Laterality: Left;   KNEE ARTHROSCOPY Bilateral    KNEE ARTHROSCOPY Right 06/24/2022   Procedure: RIGHT KNEE ARTHROSCOPY;  Surgeon: Melrose Nakayama, MD;  Location: [facility] ORS;  Service: Orthopedics;  Laterality: Right;   KNEE SURGERY Left 2009, 2010, 2012   LAPAROSCOPIC GASTRIC RESTRICTIVE DUODENAL PROCEDURE (DUODENAL SWITCH) N/A 04/17/2015   Procedure: LAPAROSCOPIC GASTRIC RESTRICTIVE DUODENAL PROCEDURE, single anastamosis ;  Surgeon: Bonner Puna, MD;  Location: [facility] ORS;  Service: General;  Laterality: N/A;   MASTECTOMY Left 1990   BREAST CA   TONSILLECTOMY     UPPER GI ENDOSCOPY N/A 04/17/2015   Procedure: UPPER GI ENDOSCOPY;  Surgeon: Bonner Puna, MD;  Location: [facility] ORS;  Service: General;  Laterality: N/A;   Patient Active Problem List   Diagnosis Date Noted   Dental abscess 05/27/2020   Abscess of mandible_left 05/26/2020  Thrombocytopenia ([place]) 05/26/2020   Facial cellulitis_left 05/26/2020   Varicose veins of both lower extremities with inflammation 05/12/2019   Chronic venous insufficiency 05/12/2019   Status post total left knee replacement 09/07/2017   Chronic bilateral thoracic back pain 09/01/2017   Neck pain 09/01/2017   Symptomatic mammary hypertrophy 09/01/2017   Difficult airway for intubation 04/09/2015   Chronic nasal congestion 04/09/2015   Atrial fibrillation ([place]) 09/16/2013   Obstructive sleep apnea 09/16/2013   Morbid obesity ([place]) 09/16/2013   Personal history of breast cancer 02/09/2013   Hypertension    Cancer ([place])     PCP: Perrin Maltese, MD  REFERRING PROVIDER: Melrose Nakayama, MD  REFERRING DIAG: acute pain of right knee  THERAPY DIAG:  Muscle weakness  (generalized)  Acute pain of right knee  Difficulty in walking, not elsewhere classified  Rationale for Evaluation and Treatment Rehabilitation  ONSET DATE: 06/24/2022  SUBJECTIVE:   SUBJECTIVE STATEMENT: Pt reports he went to the MD 4 weeks ago where she received a steroid injection. Pt reports her knee : did okay" for 4-5 days and then it went right back to the same thing. Pt told MD therapy was helping and he wanted her to continue.  Yesterday at appointment pt still has intermittent pain and she felt like the surgery overall was not a success. Her MD sent order for therapy. MD reports she will finish therapy sessions and goes back to MD on 12/6.   Pt main complain is significant pain with swisting in the bed when her knee is in flexed position.   PERTINENT HISTORY:  Pt is s/p R knee arthroscopy on 06/24/22 by Dr. Rhona Raider and was DC home with RW. Pt to attend 3 PT visits prior to surgery for conservative management.   Pt arrives to OPPT with c/o R knee and R thigh pain (thigh>knee) secondary to recent procedure. Since procedure, pt notes she's having increased pain levels, and is managing pain with over the counter medication. Pt currently stating that she does not take stronger prescription medication as she is still driving. Pt notes she ambulates with hurry cane at home. Since procedure, pt notes increased difficulty with: prolonged walking/standing, car transfers, tub transfers, LB dressing, stairs, bed mobility, driving, cooking, cleaning, grocery shopping. Pt states that due to N/T radiating into R foot she is unable to feel break/pedal, and is considering having family assist with transportation.   PAIN:  Are you having pain? Yes: NPRS scale: 1/10 Pain location: R thigh and R knee Pain description: dull/constant ache down leg, burning/tingling (foot)  PRECAUTIONS: Fall  WEIGHT BEARING RESTRICTIONS No  FALLS:  Has patient fallen in last 6 months? Yes. 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 PROCEDURE:  Right knee abrasion, chondroplasty of patellofemoral.  PATIENT SURVEYS:  FOTO 29%  COGNITION:  Overall cognitive status: Within functional limits for tasks assessed     SENSATION: TTP along R lateral thigh, anterior knee, joint space, and medial/lateral knee  EDEMA:  Circumferential:  50cm (R knee), 70cm (R thigh)   POSTURE: rounded shoulders and forward head  PALPATION: Increased edema noted to R thigh and knee; TTP along lateral thigh and knee. Unable to tolerate patellar mobilizations secondary to pain/edema.  LOWER EXTREMITY ROM:  Active ROM Right eval Left eval  Hip flexion 40deg WFL  Hip extension    Hip abduction  [facility]  Hip adduction    Hip internal rotation  [facility]  Hip external rotation  [facility]  Knee flexion 30deg WFL  Knee extension 0deg [facility]  Ankle dorsiflexion WFL   Ankle plantarflexion WFL   Ankle inversion    Ankle eversion     (Blank rows = not tested)  LOWER EXTREMITY MMT:  MMT Right eval Left eval 9/21 PN   Hip flexion 2-  4+  Hip extension     Hip abduction     Hip adduction     Hip internal rotation     Hip external rotation     Knee flexion 3-  4+  Knee extension 3-  4-  Ankle dorsiflexion     Ankle plantarflexion     Ankle inversion     Ankle eversion      (Blank rows = not tested)   FUNCTIONAL TESTS:  5 times sit to stand: 36sec, demonstrating increased R  hip ABD to avoid R knee flexion with descent. Increased time/effort for standing. BUE for support on arm rests.  GAIT: Distance walked: 49f x2 Assistive device utilized: Single point cane Level of assistance: SBA Comments: 3 point gait pattern; decreased R stance, R foot clearance, L step length, and RUE swing    TODAY'S TREATMENT: 09/30/22   Physical therapy treatment session this date focused on reevaluation patient has been instructed per her physician to continue with physical therapy interventions.  Patient has continued to experience intermittent right lower extremity pain particularly with rolling over in bed his primary causative factor.  Patient provided with the following home exercise program below in order to improve right knee terminal knee extension strength in a functional manner as well as to improve bed mobility.   Added and reviewed and educated patient regarding the following new HEP program.   Access Code: [email or web address] URL: [email or web address] Date: 09/30/2022 Prepared by: CRivka Barbara Exercises - Strep up with upper extremity suppport  - 1 x daily - 7 x weekly - 2 sets - 10 reps - Supine Bridge  - 1 x daily - 7 x weekly - 2 sets - 10 reps - Supine Lower Trunk Rotation  - 1 x daily - 7 x weekly - 2 sets - 10 reps See goals section for reassessment information/ test results.    Note: Portions of this document were prepared using Dragon voice recognition software and although reviewed may contain unintentional dictation errors in syntax, grammar, or spelling.     PATIENT EDUCATION:  Education details:  Pt educated throughout session about proper posture and technique with exercises. Improved exercise technique, movement at target joints, use of target muscles after min to mod verbal, visual, tactile cues. understanding. Person educated: Patient Education method: Explanation, Demonstration, Tactile cues, Verbal cues, and Handouts Education  comprehension: verbalized understanding   HOME EXERCISE PROGRAM: RLE heel slides, quad sets, SAQ, 3x10  Access Code: [email or web address]: [email or web address] Date: 07/21/2022 Prepared by: SAmalia Hailey Exercises - Supine Active Straight Leg Raise  - 1 x daily - 5-7 x weekly - 3 sets - 6 reps - Sidelying Hip Abduction  - 1 x  daily - 5-7 x weekly - 3 sets - 6 reps - Seated Calf Stretch  - 1 x daily - 7 x weekly - 3 sets - 10 reps   ASSESSMENT:  CLINICAL IMPRESSION: Patient presents to physical therapy for reevaluation note following visit to physician.  Patient has had injection in right knee experienced short improvement in symptoms but within 5 days was having increased pain.  Patient continues to have decreased strength in right lower extremity and does not indicate improved strength since previous strength testing in this clinic.  Patient also demonstrating improvement in self-reported symptoms evidenced by improved focus on therapeutic outcome score.  Patient also progressing with functional lower extremity strength and quantifiable lower extremity strength as evidenced by improved manual muscle testing and improved 5 times sit to stand score.  Patient will continue to benefit from skilled physical therapy services in order to improve her lower extremity strength, improve her lower extremity pain in her right knee, and improve her overall quality of life and functional capacity.    OBJECTIVE IMPAIRMENTS Abnormal gait, decreased activity tolerance, decreased balance, decreased endurance, decreased mobility, difficulty walking, decreased ROM, decreased strength, increased edema, impaired perceived functional ability, impaired flexibility, improper body mechanics, postural dysfunction, and pain.   ACTIVITY LIMITATIONS lifting, bending, sitting, standing, squatting, sleeping, stairs, transfers, bed mobility, dressing, locomotion level, and car transfers, bed mobility  PARTICIPATION  LIMITATIONS: meal prep, cleaning, laundry, driving, shopping, community activity, and occupation  PERSONAL FACTORS Age and 3+ comorbidities: HTN, hx CA, CHF  are also affecting patient's functional outcome.   REHAB POTENTIAL: Good  CLINICAL DECISION MAKING: Stable/uncomplicated  EVALUATION COMPLEXITY: Low   GOALS: Goals reviewed with patient? Yes  SHORT TERM GOALS: Target date: MET Pt will be independent and compliant with HEP in order to decrease knee pain and increase strength in order to improve pain-free function at home and work. Baseline: HEP provided Goal status: MET   LONG TERM GOALS: Target date: 11/11/2022       Patient will increase FOTO score to equal to or greater than 71% to demonstrate statistically significant improvement in mobility and quality of life. Baseline: 61% 8/30: 49% 9/21: 59% 11/7:61% Goal status: IN PROGRESS  2.  Patient will report a worst pain of 1-2/10 on VAS in R knee to improve tolerance with ADLs and reduced symptoms with activities. Baseline: 6-7/10; 8/30: 7/10 thigh pain and foot numb 9/21: 7/10 rolling in bed and knee pops 11/7: 6/10 pain turning over in bed  Goal status: IN PROGRESS  3.  Pt will be able to complete 5x STS in </= 11sec indicating decreased risk of falls per this outcome measure based on pt appropriate age group. Baseline: 36sec 8/30: 32 seconds no UE support 9/21:18.5 sec 11/7:17.6 sec Goal status: IN PROGRESS  4.  Pt will demonstrate grossly 4+/5 RLE strength for improved independence with ADL's, IADL's, and functional mobility. Baseline: 2-/5 to 3-/5 8/30: see note 9/21:4+ all except knee extension which was 4- limited by anterior knee pain 11/7: 4/5 Goal status: In Progress   4.   Patient will be independent with ascend/descend 5 steps using single UE in step over step pattern without LOB. Baseline: step to pattern with BUE support 9/21: B UE step over ascending and step to descending 11/7: able to use step over step  with B UE, first round felt pull, subsequent improved pulling. Goal status: In progress   PLAN: PT FREQUENCY: 1-2x/week  PT DURATION: 6 weeks  PLANNED INTERVENTIONS: Therapeutic exercises,  Therapeutic activity, Neuromuscular re-education, Balance training, Gait training, Patient/Family education, Self Care, Joint mobilization, Joint manipulation, Stair training, DME instructions, Dry Needling, Electrical stimulation, Cryotherapy, Moist heat, scar mobilization, Traction, Ultrasound, Ionotophoresis 53m/ml Dexamethasone, Manual therapy, and Re-evaluation  PLAN FOR NEXT SESSION: Continue with manual therapy as well as progressing right knee open chain and closed chain strength as pain allows.    CParticia LatherPT  3:48 PM,09/30/22 Physical Therapist - [facility]

## 2022-10-02 ENCOUNTER — Telehealth: Payer: Self-pay | Admitting: Physical Therapy

## 2022-10-02 ENCOUNTER — Ambulatory Visit: Payer: PRIVATE HEALTH INSURANCE | Admitting: Physical Therapy

## 2022-10-02 NOTE — Telephone Encounter (Signed)
Pt contacted and informed of missed appointment this date. Pt informed she could reschedule for tomorrow 10/03/22.  Rivka Barbara PT, DPT

## 2022-10-03 ENCOUNTER — Ambulatory Visit: Payer: PRIVATE HEALTH INSURANCE | Admitting: Physical Therapy

## 2022-10-03 DIAGNOSIS — M25561 Pain in right knee: Secondary | ICD-10-CM

## 2022-10-03 DIAGNOSIS — M6281 Muscle weakness (generalized): Secondary | ICD-10-CM

## 2022-10-03 NOTE — Therapy (Signed)
OUTPATIENT PHYSICAL THERAPY LOWER EXTREMITY     Patient Name: Mandy Roberts MRN: [id number] DOB:09/27/1961, 61 y.o., female Today's Date: 10/03/2022   PT End of Session - 10/03/22 0812     Visit Number 18    Number of Visits 28    Date for PT Re-Evaluation 11/11/22    Authorization Time Period Cert 93/7/34-28/76/81    Authorization - Visit Number 1    Authorization - Number of Visits 1    Progress Note Due on Visit 85    PT Start Time 0810    PT Stop Time 1572    PT Time Calculation (min) 34 min    Activity Tolerance Patient tolerated treatment well;Patient limited by pain    Behavior During Therapy [facility] for tasks assessed/performed                        Past Medical History:  Diagnosis Date   Atrial fibrillation ([place])    Breast cancer ([place]) 1990/1997    left mastectomy ,had recurrence in left suprclavic nodes several yrs ago-treated with radiation and chemo   CHF (congestive heart failure) ([place])    Colon polyp 6203   Complication of anesthesia    Difficult intubation    expectorated blood 3-4 days after surgery in 2011   Difficult intubation    hypoxia with endoscopy in 2016   GERD (gastroesophageal reflux disease)    History of chemotherapy    5-FU/AC in 1990 under the care of Dr. Oliva Bustard. pt also tx with tamoxifen and arimidex   History of radiation therapy    radiation treat at [facility]   Hypertension 2008   Personal history of chemotherapy 1990   BREAST CA   Personal history of malignant neoplasm of breast 1990    left mastectomy    Personal history of radiation therapy 1997   BREAST CA   Sinusitis    Sleep apnea    history of, went away after gastric surgery   Past Surgical History:  Procedure Laterality Date   [place]   COLONOSCOPY  2008, 2015   Dr. Jamal Collin   COLONOSCOPY WITH PROPOFOL N/A 10/12/2019   Procedure: COLONOSCOPY WITH PROPOFOL;  Surgeon: Robert Bellow, MD;  Location: [facility] ENDOSCOPY;   Service: Endoscopy;  Laterality: N/A;  DR BYRNETT WILL UPDATE H&P ON PROCEDURE DATE   DILATION AND CURETTAGE OF UTERUS     KNEE ARTHROPLASTY Left 09/07/2017   Procedure: COMPUTER ASSISTED TOTAL KNEE ARTHROPLASTY;  Surgeon: Dereck Leep, MD;  Location: [facility] ORS;  Service: Orthopedics;  Laterality: Left;   KNEE ARTHROSCOPY Bilateral    KNEE ARTHROSCOPY Right 06/24/2022   Procedure: RIGHT KNEE ARTHROSCOPY;  Surgeon: Melrose Nakayama, MD;  Location: [facility] ORS;  Service: Orthopedics;  Laterality: Right;   KNEE SURGERY Left 2009, 2010, 2012   LAPAROSCOPIC GASTRIC RESTRICTIVE DUODENAL PROCEDURE (DUODENAL SWITCH) N/A 04/17/2015   Procedure: LAPAROSCOPIC GASTRIC RESTRICTIVE DUODENAL PROCEDURE, single anastamosis ;  Surgeon: Bonner Puna, MD;  Location: [facility] ORS;  Service: General;  Laterality: N/A;   MASTECTOMY Left 1990   BREAST CA   TONSILLECTOMY     UPPER GI ENDOSCOPY N/A 04/17/2015   Procedure: UPPER GI ENDOSCOPY;  Surgeon: Bonner Puna, MD;  Location: [facility] ORS;  Service: General;  Laterality: N/A;   Patient Active Problem List   Diagnosis Date Noted   Dental abscess 05/27/2020   Abscess of mandible_left 05/26/2020   Thrombocytopenia ([place]) 05/26/2020  Facial cellulitis_left 05/26/2020   Varicose veins of both lower extremities with inflammation 05/12/2019   Chronic venous insufficiency 05/12/2019   Status post total left knee replacement 09/07/2017   Chronic bilateral thoracic back pain 09/01/2017   Neck pain 09/01/2017   Symptomatic mammary hypertrophy 09/01/2017   Difficult airway for intubation 04/09/2015   Chronic nasal congestion 04/09/2015   Atrial fibrillation ([place]) 09/16/2013   Obstructive sleep apnea 09/16/2013   Morbid obesity ([place]) 09/16/2013   Personal history of breast cancer 02/09/2013   Hypertension    Cancer ([place])     PCP: Perrin Maltese, MD  REFERRING PROVIDER: Melrose Nakayama, MD  REFERRING DIAG: acute pain of right knee  THERAPY DIAG:  Muscle weakness  (generalized)  Acute pain of right knee  Rationale for Evaluation and Treatment Rehabilitation  ONSET DATE: 06/24/2022  SUBJECTIVE:   SUBJECTIVE STATEMENT:  PERTINENT HISTORY:  Pt is s/p R knee arthroscopy on 06/24/22 by Dr. Rhona Raider and was DC home with RW. Pt to attend 3 PT visits prior to surgery for conservative management.   Pt arrives to OPPT with c/o R knee and R thigh pain (thigh>knee) secondary to recent procedure. Since procedure, pt notes she's having increased pain levels, and is managing pain with over the counter medication. Pt currently stating that she does not take stronger prescription medication as she is still driving. Pt notes she ambulates with hurry cane at home. Since procedure, pt notes increased difficulty with: prolonged walking/standing, car transfers, tub transfers, LB dressing, stairs, bed mobility, driving, cooking, cleaning, grocery shopping. Pt states that due to N/T radiating into R foot she is unable to feel break/pedal, and is considering having family assist with transportation.  From re-eval: Pt reports he went to the MD 4 weeks ago where she received a steroid injection. Pt reports her knee : did okay" for 4-5 days and then it went right back to the same thing. Pt told MD therapy was helping and he wanted her to continue.  Yesterday at appointment pt still has intermittent pain and she felt like the surgery overall was not a success. Her MD sent order for therapy. MD reports she will finish therapy sessions and goes back to MD on 12/6.   Pt main complain is significant pain with swisting in the bed when her knee is in flexed position.  PAIN:  Are you having pain? Yes: NPRS scale: 1/10 Pain location: R thigh and R knee Pain description: dull/constant ache down leg, burning/tingling (foot)  PRECAUTIONS: Fall  WEIGHT BEARING RESTRICTIONS No  FALLS:  Has patient fallen in last 6 months? Yes. 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 PROCEDURE:  Right knee abrasion, chondroplasty of patellofemoral.  PATIENT SURVEYS:  FOTO 29%  COGNITION:  Overall cognitive status: Within functional limits for tasks assessed     SENSATION: TTP along R lateral thigh, anterior knee, joint space, and medial/lateral knee  EDEMA:  Circumferential: 50cm (R knee), 70cm (R thigh)   POSTURE: rounded shoulders  and forward head  PALPATION: Increased edema noted to R thigh and knee; TTP along lateral thigh and knee. Unable to tolerate patellar mobilizations secondary to pain/edema.  LOWER EXTREMITY ROM:  Active ROM Right eval Left eval  Hip flexion 40deg WFL  Hip extension    Hip abduction  [facility]  Hip adduction    Hip internal rotation  [facility]  Hip external rotation  [facility]  Knee flexion 30deg WFL  Knee extension 0deg [facility]  Ankle dorsiflexion WFL   Ankle plantarflexion WFL   Ankle inversion    Ankle eversion     (Blank rows = not tested)  LOWER EXTREMITY MMT:  MMT Right eval Left eval 9/21 PN   Hip flexion 2-  4+  Hip extension     Hip abduction     Hip adduction     Hip internal rotation     Hip external rotation     Knee flexion 3-  4+  Knee extension 3-  4-  Ankle dorsiflexion     Ankle plantarflexion     Ankle inversion     Ankle eversion      (Blank rows = not tested)   FUNCTIONAL TESTS:  5 times sit to stand: 36sec, demonstrating increased R hip ABD to avoid R knee  flexion with descent. Increased time/effort for standing. BUE for support on arm rests.  GAIT: Distance walked: 53f x2 Assistive device utilized: Single point cane Level of assistance: SBA Comments: 3 point gait pattern; decreased R stance, R foot clearance, L step length, and RUE swing    TODAY'S TREATMENT: 10/03/22   Exercise/Activity Sets/Reps/Time/ Resistance Assistance Charge type Comments  Bridges  X 15   TE   Bridge with hip scoot ( similar to supine PWR! Step  X 10 to ea side   TE   Supine SLR with hip abduction  2 x 10   TE   Sidelying clamshell  2 x 10 GTB   TE   Sidelyinng hip abduction  2 x 10   TE   Manual therapy    Manual  Manual therapy to pt vastus medialis with STM and IASTM, pt notes improvement in symptoms in his LE with this activity.   Step ups skipping step (step over pattern)  2 x 10 R LE only   TE   Hip abduction with red Thera-Band in standing position 2x10 reps  TE                     Treatment Provided this session   Rationale for Evaluation and Treatment Rehabilitation  Pt educated throughout session about proper posture and technique with exercises. Improved exercise technique, movement at target joints, use of target muscles after min to mod verbal, visual, tactile cues. Note: Portions of this document were prepared using Dragon voice recognition software and although reviewed may contain unintentional dictation errors in syntax, grammar, or spelling.   Note: Portions of this document were prepared using Dragon voice recognition software and although reviewed may contain unintentional dictation errors in syntax, grammar, or spelling.     PATIENT EDUCATION:  Education details:  Pt educated throughout session about proper posture and technique with exercises. Improved exercise technique, movement at target joints, use of target muscles after min to mod verbal, visual, tactile cues. understanding. Person educated: Patient Education method:  Explanation, Demonstration, Tactile cues, Verbal cues, and Handouts Education comprehension: verbalized understanding   HOME EXERCISE PROGRAM: RLE heel slides, quad sets, SAQ, 3x10  Access Code: [email or web address] URL: [email or web address] Date: 07/21/2022 Prepared by: Amalia Hailey  Exercises - Supine Active Straight Leg Raise  - 1 x daily - 5-7 x weekly - 3 sets - 6 reps - Sidelying Hip Abduction  - 1 x daily - 5-7 x weekly - 3 sets - 6 reps - Seated Calf Stretch  - 1 x daily - 7 x weekly - 3 sets - 10 reps   ASSESSMENT:  CLINICAL IMPRESSION: Patient continue with plan of care this date.  Physical therapy focused on bed mobility and hip strengthening exercises this date overall with good results.  Patient has noticed a decrease in the frequency of her pain since beginning with new home exercise program.  Patient also continuing to have discomfort in her vastus medialis muscle which was addressed with manual therapy and patient does note improvement with this.  This muscle also targeted with therapeutic exercise this date.Pt will continue to benefit from skilled physical therapy intervention to address impairments, improve QOL, and attain therapy goals.      OBJECTIVE IMPAIRMENTS Abnormal gait, decreased activity tolerance, decreased balance, decreased endurance, decreased mobility, difficulty walking, decreased ROM, decreased strength, increased edema, impaired perceived functional ability, impaired flexibility, improper body mechanics, postural dysfunction, and pain.   ACTIVITY LIMITATIONS lifting, bending, sitting, standing, squatting, sleeping, stairs, transfers, bed mobility, dressing, locomotion level, and car transfers, bed mobility  PARTICIPATION LIMITATIONS: meal prep, cleaning, laundry, driving, shopping, community activity, and occupation  PERSONAL FACTORS Age and 3+ comorbidities: HTN, hx CA, CHF  are also affecting patient's functional outcome.   REHAB POTENTIAL:  Good  CLINICAL DECISION MAKING: Stable/uncomplicated  EVALUATION COMPLEXITY: Low   GOALS: Goals reviewed with patient? Yes  SHORT TERM GOALS: Target date: MET Pt will be independent and compliant with HEP in order to decrease knee pain and increase strength in order to improve pain-free function at home and work. Baseline: HEP provided Goal status: MET   LONG TERM GOALS: Target date: 11/11/2022       Patient will increase FOTO score to equal to or greater than 71% to demonstrate statistically significant improvement in mobility and quality of life. Baseline: 61% 8/30: 49% 9/21: 59% 11/7:61% Goal status: IN PROGRESS  2.  Patient will report a worst pain of 1-2/10 on VAS in R knee to improve tolerance with ADLs and reduced symptoms with activities. Baseline: 6-7/10; 8/30: 7/10 thigh pain and foot numb 9/21: 7/10 rolling in bed and knee pops 11/7: 6/10 pain turning over in bed  Goal status: IN PROGRESS  3.  Pt will be able to complete 5x STS in </= 11sec indicating decreased risk of falls per this outcome measure based on pt appropriate age group. Baseline: 36sec 8/30: 32 seconds no UE support 9/21:18.5 sec 11/7:17.6 sec Goal status: IN PROGRESS  4.  Pt will demonstrate grossly 4+/5 RLE strength for improved independence with ADL's, IADL's, and functional mobility. Baseline: 2-/5 to 3-/5 8/30: see note 9/21:4+ all except knee extension which was 4- limited by anterior knee pain 11/7: 4/5 Goal status: In Progress   4.   Patient will be independent with ascend/descend 5 steps using single UE in step over step pattern without LOB. Baseline: step to pattern with BUE support 9/21: B UE step over ascending and step to descending 11/7: able to use step over step with B UE, first round felt pull, subsequent improved pulling. Goal status: In progress   PLAN: PT FREQUENCY: 1-2x/week  PT DURATION: 6 weeks  PLANNED INTERVENTIONS: Therapeutic exercises,  Therapeutic activity,  Neuromuscular re-education, Balance training, Gait training, Patient/Family education, Self Care, Joint mobilization, Joint manipulation, Stair training, DME instructions, Dry Needling, Electrical stimulation, Cryotherapy, Moist heat, scar mobilization, Traction, Ultrasound, Ionotophoresis 63m/ml Dexamethasone, Manual therapy, and Re-evaluation  PLAN FOR NEXT SESSION: Continue with manual therapy as well as progressing right lower extremity hip and knee strengthening     CParticia LatherPT  11:14 AM,10/03/22 Physical Therapist - [facility]

## 2022-10-07 ENCOUNTER — Ambulatory Visit: Payer: PRIVATE HEALTH INSURANCE | Attending: Orthopaedic Surgery | Admitting: Physical Therapy

## 2022-10-07 DIAGNOSIS — R262 Difficulty in walking, not elsewhere classified: Secondary | ICD-10-CM

## 2022-10-07 DIAGNOSIS — M6281 Muscle weakness (generalized): Secondary | ICD-10-CM | POA: Diagnosis present

## 2022-10-07 NOTE — Therapy (Signed)
OUTPATIENT PHYSICAL THERAPY LOWER EXTREMITY     Patient Name: Mandy Roberts MRN: [id number] DOB:11-Nov-1961, 61 y.o., female Today's Date: 10/07/2022                Past Medical History:  Diagnosis Date   Atrial fibrillation ([place])    Breast cancer ([place]) 1990/1997    left mastectomy ,had recurrence in left suprclavic nodes several yrs ago-treated with radiation and chemo   CHF (congestive heart failure) ([place])    Colon polyp [id number]   Complication of anesthesia    Difficult intubation    expectorated blood 3-4 days after surgery in 2011   Difficult intubation    hypoxia with endoscopy in 2016   GERD (gastroesophageal reflux disease)    History of chemotherapy    5-FU/AC in 1990 under the care of Dr. Oliva Bustard. pt also tx with tamoxifen and arimidex   History of radiation therapy    radiation treat at [facility]   Hypertension 2008   Personal history of chemotherapy 1990   BREAST CA   Personal history of malignant neoplasm of breast 1990    left mastectomy    Personal history of radiation therapy 1997   BREAST CA   Sinusitis    Sleep apnea    history of, went away after gastric surgery   Past Surgical History:  Procedure Laterality Date   [place]   COLONOSCOPY  2008, 2015   Dr. Jamal Collin   COLONOSCOPY WITH PROPOFOL N/A 10/12/2019   Procedure: COLONOSCOPY WITH PROPOFOL;  Surgeon: Robert Bellow, MD;  Location: [facility] END[facility];  Service: Endoscopy;  Laterality: N/A;  DR BYRNETT WILL UPDATE H&P ON PROCEDURE DATE   DILATION AND CURETTAGE OF UTERUS     KNEE ARTHROPLASTY Left 09/07/2017   Procedure: COMPUTER ASSISTED TOTAL KNEE ARTHROPLASTY;  Surgeon: Dereck Leep, MD;  Location: [facility] ORS;  Service: Orthopedics;  Laterality: Left;   KNEE ARTHROSCOPY Bilateral    KNEE ARTHROSCOPY Right 06/24/2022   Procedure: RIGHT KNEE ARTHROSCOPY;  Surgeon: Melrose Nakayama, MD;  Location: [facility] OR[facility];  Service: Orthopedics;  Laterality: Right;   KNEE  SURGERY Left 2009, 2010, 2012   LAPAROSCOPIC GASTRIC RESTRICTIVE DUODENAL PROCEDURE (DUODENAL SWITCH) N/A 04/17/2015   Procedure: LAPAROSCOPIC GASTRIC RESTRICTIVE DUODENAL PROCEDURE, single anastamosis ;  Surgeon: Bonner Puna, MD;  Location: [facility] ORS;  Service: General;  Laterality: N/A;   MASTECTOMY Left 1990   BREAST CA   TONSILLECTOMY     UPPER GI ENDOSCOPY N/A 04/17/2015   Procedure: UPPER GI ENDOSCOPY;  Surgeon: Bonner Puna, MD;  Location: [facility] ORS;  Service: General;  Laterality: N/A;   Patient Active Problem List   Diagnosis Date Noted   Dental abscess 05/27/2020   Abscess of mandible_left 05/26/2020   Thrombocytopenia ([place]) 05/26/2020   Facial cellulitis_left 05/26/2020   Varicose veins of both lower extremities with inflammation 05/12/2019   Chronic venous insufficiency 05/12/2019   Status post total left knee replacement 09/07/2017   Chronic bilateral thoracic back pain 09/01/2017   Neck pain 09/01/2017   Symptomatic mammary hypertrophy 09/01/2017   Difficult airway for intubation 04/09/2015   Chronic nasal congestion 04/09/2015   Atrial fibrillation ([place]) 09/16/2013   Obstructive sleep apnea 09/16/2013   Morbid obesity ([place]) 09/16/2013   Personal history of breast cancer 02/09/2013   Hypertension    Cancer ([place])     PCP: Perrin Maltese, MD  REFERRING PROVIDER: Melrose Nakayama, MD  REFERRING DIAG: acute pain of  right knee  THERAPY DIAG:  No diagnosis found.  Rationale for Evaluation and Treatment Rehabilitation  ONSET DATE: 06/24/2022  SUBJECTIVE:   SUBJECTIVE STATEMENT: Pt reports improvement in bed mobility but still having intermittent pain.   PERTINENT HISTORY:  Pt is s/p R knee arthroscopy on 06/24/22 by Dr. Rhona Raider and was DC home with RW. Pt to attend 3 PT visits prior to surgery for conservative management.   Pt arrives to OPPT with c/o R knee and R thigh pain (thigh>knee) secondary to recent procedure. Since procedure, pt notes she's having increased  pain levels, and is managing pain with over the counter medication. Pt currently stating that she does not take stronger prescription medication as she is still driving. Pt notes she ambulates with hurry cane at home. Since procedure, pt notes increased difficulty with: prolonged walking/standing, car transfers, tub transfers, LB dressing, stairs, bed mobility, driving, cooking, cleaning, grocery shopping. Pt states that due to N/T radiating into R foot she is unable to feel break/pedal, and is considering having family assist with transportation.  From re-eval: Pt reports he went to the MD 4 weeks ago where she received a steroid injection. Pt reports her knee : did okay" for 4-5 days and then it went right back to the same thing. Pt told MD therapy was helping and he wanted her to continue.  Yesterday at appointment pt still has intermittent pain and she felt like the surgery overall was not a success. Her MD sent order for therapy. MD reports she will finish therapy sessions and goes back to MD on 12/6.   Pt main complain is significant pain with swisting in the bed when her knee is in flexed position.  PAIN:  Are you having pain? Yes: NPRS scale: 1/10 Pain location: R thigh and R knee Pain description: dull/constant ache down leg, burning/tingling (foot)  PRECAUTIONS: Fall  WEIGHT BEARING RESTRICTIONS No  FALLS:  Has patient fallen in last 6 months? Yes. 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 PROCEDURE:  Right knee abrasion, chondroplasty of patellofemoral.  PATIENT SURVEYS:  FOTO 29%  COGNITION:  Overall cognitive status: Within functional limits for tasks assessed     SENSATION: TTP along R lateral thigh, anterior knee, joint space, and medial/lateral knee  EDEMA:  Circumferential: 50cm (R knee), 70cm (R thigh)   POSTURE: rounded shoulders and forward head  PALPATION: Increased edema noted to R thigh and knee; TTP along lateral thigh and knee. Unable to tolerate patellar mobilizations secondary to pain/edema.  LOWER EXTREMITY ROM:  Active ROM Right eval Left eval  Hip flexion 40deg WFL  Hip extension    Hip abduction  [facility]  Hip adduction    Hip internal rotation  [facility]  Hip external rotation  [facility]  Knee flexion 30deg WFL  Knee extension 0deg [facility]  Ankle dorsiflexion WFL   Ankle plantarflexion WFL   Ankle inversion    Ankle eversion     (Blank rows = not tested)  LOWER EXTREMITY  MMT:  MMT Right eval Left eval 9/21 PN   Hip flexion 2-  4+  Hip extension     Hip abduction     Hip adduction     Hip internal rotation     Hip external rotation     Knee flexion 3-  4+  Knee extension 3-  4-  Ankle dorsiflexion     Ankle plantarflexion     Ankle inversion     Ankle eversion      (Blank rows = not tested)   FUNCTIONAL TESTS:  5 times sit to stand: 36sec, demonstrating increased R hip ABD to avoid R knee flexion with descent. Increased time/effort for standing. BUE for support on arm rests.  GAIT: Distance walked: 56f x2 Assistive device utilized: Single point cane Level of assistance: SBA Comments: 3 point gait pattern; decreased R stance, R foot clearance, L step length, and RUE swing    TODAY'S TREATMENT: 10/07/22   Exercise/Activity Sets/Reps/Time/ Resistance Assistance Charge type Comments  Bridges SL eccentric portion  *10   TE   Bridge with hip scoot ( similar to supine  PWR! Step  X 10 to ea side   TE   Supine SLR with hip abduction  2 x 10   TE   Sidelying clamshell  2 x 10 GTB   TE   Sidelyinng hip abduction  2 x 10   TE   Bridge with march  X 10 ea LE   TE Difficulty with score stabilization    Manual therapy    Manual  Manual therapy to pt vastus medialis with STM and IASTM, noted improvement in TP size and sensitivity this date compared to last visit.   Step ups skipping step (step over pattern)  2 x 12 R LE only   TE   Hip abduction with green Thera-Band in standing position 2x12 reps  TE                     Treatment Provided this session   Rationale for Evaluation and Treatment Rehabilitation  Pt educated throughout session about proper posture and technique with exercises. Improved exercise technique, movement at target joints, use of target muscles after min to mod verbal, visual, tactile cues. Note: Portions of this document were prepared using Dragon voice recognition software and although reviewed may contain unintentional dictation errors in syntax, grammar, or spelling.     PATIENT EDUCATION:  Education details:  Pt educated throughout session about proper posture and technique with exercises. Improved exercise technique, movement at target joints, use of target muscles after min to mod verbal, visual, tactile cues. understanding. Person educated: Patient Education method: Explanation, Demonstration, Tactile cues, Verbal cues, and Handouts Education comprehension: verbalized understanding   HOME EXERCISE PROGRAM: RLE heel slides, quad sets, SAQ, 3x10  Access Code: [email or web address]: [email or web address] Date: 07/21/2022 Prepared by: SAmalia Hailey Exercises - Supine Active Straight Leg Raise  - 1 x daily - 5-7 x weekly - 3 sets - 6 reps - Sidelying Hip Abduction  - 1 x daily - 5-7 x weekly - 3 sets - 6 reps - Seated Calf Stretch  - 1 x daily - 7 x weekly - 3 sets - 10 reps   ASSESSMENT:  CLINICAL IMPRESSION:  Patient continue with plan of care this date.  Physical therapy focused on bed mobility and hip strengthening exercises this date overall with good results.  Patient has noticed a decrease in the frequency of her pain  since beginning with new home exercise program. Pt notes continued intermittent discomfort in her R LE with certain twisting or shifting movement but she is unable to consistently reproduce this feeling. This muscle also targeted with therapeutic exercise this date.Pt will continue to benefit from skilled physical therapy intervention to address impairments, improve QOL, and attain therapy goals.      OBJECTIVE IMPAIRMENTS Abnormal gait, decreased activity tolerance, decreased balance, decreased endurance, decreased mobility, difficulty walking, decreased ROM, decreased strength, increased edema, impaired perceived functional ability, impaired flexibility, improper body mechanics, postural dysfunction, and pain.   ACTIVITY LIMITATIONS lifting, bending, sitting, standing, squatting, sleeping, stairs, transfers, bed mobility, dressing, locomotion level, and car transfers, bed mobility  PARTICIPATION LIMITATIONS: meal prep, cleaning, laundry, driving, shopping, community activity, and occupation  PERSONAL FACTORS Age and 3+ comorbidities: HTN, hx CA, CHF  are also affecting patient's functional outcome.   REHAB POTENTIAL: Good  CLINICAL DECISION MAKING: Stable/uncomplicated  EVALUATION COMPLEXITY: Low   GOALS: Goals reviewed with patient? Yes  SHORT TERM GOALS: Target date: MET Pt will be independent and compliant with HEP in order to decrease knee pain and increase strength in order to improve pain-free function at home and work. Baseline: HEP provided Goal status: MET   LONG TERM GOALS: Target date: 11/11/2022       Patient will increase FOTO score to equal to or greater than 71% to demonstrate statistically significant improvement in mobility and quality of  life. Baseline: 61% 8/30: 49% 9/21: 59% 11/7:61% Goal status: IN PROGRESS  2.  Patient will report a worst pain of 1-2/10 on VAS in R knee to improve tolerance with ADLs and reduced symptoms with activities. Baseline: 6-7/10; 8/30: 7/10 thigh pain and foot numb 9/21: 7/10 rolling in bed and knee pops 11/7: 6/10 pain turning over in bed  Goal status: IN PROGRESS  3.  Pt will be able to complete 5x STS in </= 11sec indicating decreased risk of falls per this outcome measure based on pt appropriate age group. Baseline: 36sec 8/30: 32 seconds no UE support 9/21:18.5 sec 11/7:17.6 sec Goal status: IN PROGRESS  4.  Pt will demonstrate grossly 4+/5 RLE strength for improved independence with ADL's, IADL's, and functional mobility. Baseline: 2-/5 to 3-/5 8/30: see note 9/21:4+ all except knee extension which was 4- limited by anterior knee pain 11/7: 4/5 Goal status: In Progress   4.   Patient will be independent with ascend/descend 5 steps using single UE in step over step pattern without LOB. Baseline: step to pattern with BUE support 9/21: B UE step over ascending and step to descending 11/7: able to use step over step with B UE, first round felt pull, subsequent improved pulling. Goal status: In progress   PLAN: PT FREQUENCY: 1-2x/week  PT DURATION: 6 weeks  PLANNED INTERVENTIONS: Therapeutic exercises, Therapeutic activity, Neuromuscular re-education, Balance training, Gait training, Patient/Family education, Self Care, Joint mobilization, Joint manipulation, Stair training, DME instructions, Dry Needling, Electrical stimulation, Cryotherapy, Moist heat, scar mobilization, Traction, Ultrasound, Ionotophoresis 20m/ml Dexamethasone, Manual therapy, and Re-evaluation  PLAN FOR NEXT SESSION: Continue with manual therapy as well as progressing right lower extremity hip and knee strengthening     CParticia LatherPT  8:29 AM,10/07/22 Physical Therapist - [facility]

## 2022-10-09 ENCOUNTER — Ambulatory Visit: Payer: PRIVATE HEALTH INSURANCE

## 2022-10-13 ENCOUNTER — Other Ambulatory Visit: Payer: Self-pay

## 2022-10-14 ENCOUNTER — Ambulatory Visit: Payer: PRIVATE HEALTH INSURANCE | Admitting: Physical Therapy

## 2022-10-14 DIAGNOSIS — R262 Difficulty in walking, not elsewhere classified: Secondary | ICD-10-CM

## 2022-10-14 DIAGNOSIS — M6281 Muscle weakness (generalized): Secondary | ICD-10-CM | POA: Diagnosis not present

## 2022-10-14 NOTE — Therapy (Signed)
OUTPATIENT PHYSICAL THERAPY LOWER EXTREMITY     Patient Name: Mandy Roberts MRN: [id number] DOB:Sep 01, 1961, 61 y.o., female Today's Date: 10/14/2022   PT End of Session - 10/14/22 1528     Visit Number 20    Number of Visits 28    Date for PT Re-Evaluation 11/11/22    Authorization Time Period Cert 03/29/96-94/80/16    Authorization - Number of Visits --    Progress Note Due on Visit 90    PT Start Time 5537    PT Stop Time 1559    PT Time Calculation (min) 38 min    Activity Tolerance Patient tolerated treatment well;Patient limited by pain    Behavior During Therapy [facility] for tasks assessed/performed                         Past Medical History:  Diagnosis Date   Atrial fibrillation ([place])    Breast cancer ([place]) 1990/1997    left mastectomy ,had recurrence in left suprclavic nodes several yrs ago-treated with radiation and chemo   CHF (congestive heart failure) ([place])    Colon polyp 4827   Complication of anesthesia    Difficult intubation    expectorated blood 3-4 days after surgery in 2011   Difficult intubation    hypoxia with endoscopy in 2016   GERD (gastroesophageal reflux disease)    History of chemotherapy    5-FU/AC in 1990 under the care of Dr. Oliva Bustard. pt also tx with tamoxifen and arimidex   History of radiation therapy    radiation treat at [facility]   Hypertension 2008   Personal history of chemotherapy 1990   BREAST CA   Personal history of malignant neoplasm of breast 1990    left mastectomy    Personal history of radiation therapy 1997   BREAST CA   Sinusitis    Sleep apnea    history of, went away after gastric surgery   Past Surgical History:  Procedure Laterality Date   [place]   COLONOSCOPY  2008, 2015   Dr. Jamal Collin   COLONOSCOPY WITH PROPOFOL N/A 10/12/2019   Procedure: COLONOSCOPY WITH PROPOFOL;  Surgeon: Robert Bellow, MD;  Location: [facility] ENDOSCOPY;  Service: Endoscopy;   Laterality: N/A;  DR BYRNETT WILL UPDATE H&P ON PROCEDURE DATE   DILATION AND CURETTAGE OF UTERUS     KNEE ARTHROPLASTY Left 09/07/2017   Procedure: COMPUTER ASSISTED TOTAL KNEE ARTHROPLASTY;  Surgeon: Dereck Leep, MD;  Location: [facility] ORS;  Service: Orthopedics;  Laterality: Left;   KNEE ARTHROSCOPY Bilateral    KNEE ARTHROSCOPY Right 06/24/2022   Procedure: RIGHT KNEE ARTHROSCOPY;  Surgeon: Melrose Nakayama, MD;  Location: [facility] ORS;  Service: Orthopedics;  Laterality: Right;   KNEE SURGERY Left 2009, 2010, 2012   LAPAROSCOPIC GASTRIC RESTRICTIVE DUODENAL PROCEDURE (DUODENAL SWITCH) N/A 04/17/2015   Procedure: LAPAROSCOPIC GASTRIC RESTRICTIVE DUODENAL PROCEDURE, single anastamosis ;  Surgeon: Bonner Puna, MD;  Location: [facility] ORS;  Service: General;  Laterality: N/A;   MASTECTOMY Left 1990   BREAST CA   TONSILLECTOMY     UPPER GI ENDOSCOPY N/A 04/17/2015   Procedure: UPPER GI ENDOSCOPY;  Surgeon: Bonner Puna, MD;  Location: [facility] ORS;  Service: General;  Laterality: N/A;   Patient Active Problem List   Diagnosis Date Noted   Dental abscess 05/27/2020   Abscess of mandible_left 05/26/2020   Thrombocytopenia ([place]) 05/26/2020   Facial cellulitis_left 05/26/2020  Varicose veins of both lower extremities with inflammation 05/12/2019   Chronic venous insufficiency 05/12/2019   Status post total left knee replacement 09/07/2017   Chronic bilateral thoracic back pain 09/01/2017   Neck pain 09/01/2017   Symptomatic mammary hypertrophy 09/01/2017   Difficult airway for intubation 04/09/2015   Chronic nasal congestion 04/09/2015   Atrial fibrillation ([place]) 09/16/2013   Obstructive sleep apnea 09/16/2013   Morbid obesity ([place]) 09/16/2013   Personal history of breast cancer 02/09/2013   Hypertension    Cancer ([place])     PCP: Perrin Maltese, MD  REFERRING PROVIDER: Melrose Nakayama, MD  REFERRING DIAG: acute pain of right knee  THERAPY DIAG:  Muscle weakness (generalized)  Difficulty in  walking, not elsewhere classified  Rationale for Evaluation and Treatment Rehabilitation  ONSET DATE: 06/24/2022  SUBJECTIVE:   SUBJECTIVE STATEMENT: Pt reports no changes to medical history since last visit. Pt has only had 2 incidences of knee pain since last visit.   PERTINENT HISTORY:  Pt is s/p R knee arthroscopy on 06/24/22 by Dr. Rhona Raider and was DC home with RW. Pt to attend 3 PT visits prior to surgery for conservative management.   Pt arrives to OPPT with c/o R knee and R thigh pain (thigh>knee) secondary to recent procedure. Since procedure, pt notes she's having increased pain levels, and is managing pain with over the counter medication. Pt currently stating that she does not take stronger prescription medication as she is still driving. Pt notes she ambulates with hurry cane at home. Since procedure, pt notes increased difficulty with: prolonged walking/standing, car transfers, tub transfers, LB dressing, stairs, bed mobility, driving, cooking, cleaning, grocery shopping. Pt states that due to N/T radiating into R foot she is unable to feel break/pedal, and is considering having family assist with transportation.  From re-eval: Pt reports he went to the MD 4 weeks ago where she received a steroid injection. Pt reports her knee : did okay" for 4-5 days and then it went right back to the same thing. Pt told MD therapy was helping and he wanted her to continue.  Yesterday at appointment pt still has intermittent pain and she felt like the surgery overall was not a success. Her MD sent order for therapy. MD reports she will finish therapy sessions and goes back to MD on 12/6.   Pt main complain is significant pain with swisting in the bed when her knee is in flexed position.  PAIN:  Are you having pain? Yes: NPRS scale: 1/10 Pain location: R thigh and R knee Pain description: dull/constant ache down leg, burning/tingling (foot)  PRECAUTIONS: Fall  WEIGHT BEARING RESTRICTIONS  No  FALLS:  Has patient fallen in last 6 months? Yes. 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 PROCEDURE:  Right knee abrasion, chondroplasty of patellofemoral.  PATIENT SURVEYS:  FOTO 29%  COGNITION:  Overall cognitive status: Within functional limits for tasks assessed     SENSATION: TTP along R lateral thigh, anterior knee, joint  space, and medial/lateral knee  EDEMA:  Circumferential: 50cm (R knee), 70cm (R thigh)   POSTURE: rounded shoulders and forward head  PALPATION: Increased edema noted to R thigh and knee; TTP along lateral thigh and knee. Unable to tolerate patellar mobilizations secondary to pain/edema.  LOWER EXTREMITY ROM:  Active ROM Right eval Left eval  Hip flexion 40deg WFL  Hip extension    Hip abduction  [facility]  Hip adduction    Hip internal rotation  [facility]  Hip external rotation  [facility]  Knee flexion 30deg WFL  Knee extension 0deg [facility]  Ankle dorsiflexion WFL   Ankle plantarflexion WFL   Ankle inversion    Ankle eversion     (Blank rows = not tested)  LOWER EXTREMITY MMT:  MMT Right eval Left eval 9/21 PN   Hip flexion 2-  4+  Hip extension     Hip abduction     Hip adduction     Hip internal rotation     Hip external rotation     Knee flexion 3-  4+  Knee extension 3-  4-  Ankle dorsiflexion     Ankle plantarflexion     Ankle inversion     Ankle eversion      (Blank rows = not  tested)   FUNCTIONAL TESTS:  5 times sit to stand: 36sec, demonstrating increased R hip ABD to avoid R knee flexion with descent. Increased time/effort for standing. BUE for support on arm rests.  GAIT: Distance walked: 47f x2 Assistive device utilized: Single point cane Level of assistance: SBA Comments: 3 point gait pattern; decreased R stance, R foot clearance, L step length, and RUE swing    TODAY'S TREATMENT: 10/14/22   Exercise/Activity Sets/Reps/Time/ Resistance Assistance Charge type Comments  Bridges SL eccentric portion  *10   TE   Bridge  2x10   TE   Supine SLR with hip abduction  2 x 10   TE   Sidelying clamshell  2 x 10 GTB   TE   Sidelyinng hip abduction  2 x 10   TE   Bridge with march  X 4 ea LE   TE Cramping in L LE with stance phase.   Manual therapy  *849m  Manual  Manual therapy to pt vastus medialis with STM and IASTM, noted improvement in TP size and sensitivity this date compared to previous visits. L HS stretch to alleviate cramping noted in bridge with march x 1 min hold.   Hip adduction with green Thera-Band in standing position 2x12 reps  TE   LAQ  2x10x3#  TE               Treatment Provided this session   Rationale for Evaluation and Treatment Rehabilitation  Pt educated throughout session about proper posture and technique with exercises. Improved exercise technique, movement at target joints, use of target muscles after min to mod verbal, visual, tactile cues. Note: Portions of this document were prepared using Dragon voice recognition software and although reviewed may contain unintentional dictation errors in syntax, grammar, or spelling.     PATIENT EDUCATION:  Education details:  Pt educated throughout session about proper posture and technique with exercises. Improved exercise technique, movement at target joints, use of target muscles after min to mod verbal, visual, tactile cues. understanding. Person educated: Patient Education method:  Explanation, Demonstration, Tactile cues, Verbal cues, and Handouts Education comprehension: verbalized understanding   HOME EXERCISE PROGRAM: RLE heel slides, quad sets, SAQ, 3x10  Access Code: [email or web address] URL: [email or web address] Date: 07/21/2022 Prepared by: Amalia Hailey  Exercises - Supine Active Straight Leg Raise  - 1 x daily - 5-7 x weekly - 3 sets - 6 reps - Sidelying Hip Abduction  - 1 x daily - 5-7 x weekly - 3 sets - 6 reps - Seated Calf Stretch  - 1 x daily - 7 x weekly - 3 sets - 10 reps   ASSESSMENT:  CLINICAL IMPRESSION: Patient continue with plan of care this date.  Physical therapy focused on bed mobility and hip strengthening exercises this date overall with good results.  Pt still has intermittent knee pain but only 2 incidences of any knee pain since last visit indicating further improvement in pain occurrence. Pt notes continued intermittent discomfort in her R LE with certain twisting or shifting movement but she is unable to consistently reproduce this feeling. Pt will continue to benefit from skilled physical therapy intervention to address impairments, improve QOL, and attain therapy goals.      OBJECTIVE IMPAIRMENTS Abnormal gait, decreased activity tolerance, decreased balance, decreased endurance, decreased mobility, difficulty walking, decreased ROM, decreased strength, increased edema, impaired perceived functional ability, impaired flexibility, improper body mechanics, postural dysfunction, and pain.   ACTIVITY LIMITATIONS lifting, bending, sitting, standing, squatting, sleeping, stairs, transfers, bed mobility, dressing, locomotion level, and car transfers, bed mobility  PARTICIPATION LIMITATIONS: meal prep, cleaning, laundry, driving, shopping, community activity, and occupation  PERSONAL FACTORS Age and 3+ comorbidities: HTN, hx CA, CHF  are also affecting patient's functional outcome.   REHAB POTENTIAL: Good  CLINICAL DECISION MAKING:  Stable/uncomplicated  EVALUATION COMPLEXITY: Low   GOALS: Goals reviewed with patient? Yes  SHORT TERM GOALS: Target date: MET Pt will be independent and compliant with HEP in order to decrease knee pain and increase strength in order to improve pain-free function at home and work. Baseline: HEP provided Goal status: MET   LONG TERM GOALS: Target date: 11/11/2022       Patient will increase FOTO score to equal to or greater than 71% to demonstrate statistically significant improvement in mobility and quality of life. Baseline: 61% 8/30: 49% 9/21: 59% 11/7:61% Goal status: IN PROGRESS  2.  Patient will report a worst pain of 1-2/10 on VAS in R knee to improve tolerance with ADLs and reduced symptoms with activities. Baseline: 6-7/10; 8/30: 7/10 thigh pain and foot numb 9/21: 7/10 rolling in bed and knee pops 11/7: 6/10 pain turning over in bed  Goal status: IN PROGRESS  3.  Pt will be able to complete 5x STS in </= 11sec indicating decreased risk of falls per this outcome measure based on pt appropriate age group. Baseline: 36sec 8/30: 32 seconds no UE support 9/21:18.5 sec 11/7:17.6 sec Goal status: IN PROGRESS  4.  Pt will demonstrate grossly 4+/5 RLE strength for improved independence with ADL's, IADL's, and functional mobility. Baseline: 2-/5 to 3-/5 8/30: see note 9/21:4+ all except knee extension which was 4- limited by anterior knee pain 11/7: 4/5 Goal status: In Progress   4.   Patient will be independent with ascend/descend 5 steps using single UE in step over step pattern without LOB. Baseline: step to pattern with BUE support 9/21: B UE step over ascending and step to descending 11/7: able to use step over step with B UE, first round felt pull, subsequent improved pulling. Goal status: In progress   PLAN: PT FREQUENCY: 1-2x/week  PT DURATION: 6 weeks  PLANNED INTERVENTIONS: Therapeutic exercises, Therapeutic activity, Neuromuscular re-education, Balance  training, Gait training, Patient/Family education, Self Care, Joint mobilization, Joint manipulation, Stair training, DME instructions, Dry Needling, Electrical stimulation, Cryotherapy, Moist heat, scar mobilization, Traction, Ultrasound, Ionotophoresis 17m/ml Dexamethasone, Manual therapy, and Re-evaluation  PLAN FOR NEXT SESSION: Continue with manual therapy as needed as well as progressing right lower extremity hip and knee strengthening     CParticia LatherPT  3:29 PM,10/14/22 Physical Therapist - [facility]

## 2022-10-20 ENCOUNTER — Ambulatory Visit: Payer: PRIVATE HEALTH INSURANCE

## 2022-10-20 ENCOUNTER — Other Ambulatory Visit: Payer: Self-pay

## 2022-10-21 ENCOUNTER — Other Ambulatory Visit: Payer: Self-pay

## 2022-10-22 ENCOUNTER — Ambulatory Visit: Payer: PRIVATE HEALTH INSURANCE

## 2022-10-23 ENCOUNTER — Other Ambulatory Visit: Payer: Self-pay | Admitting: General Surgery

## 2022-10-23 DIAGNOSIS — Z1231 Encounter for screening mammogram for malignant neoplasm of breast: Secondary | ICD-10-CM

## 2022-10-28 ENCOUNTER — Ambulatory Visit: Payer: PRIVATE HEALTH INSURANCE | Admitting: Physical Therapy

## 2022-10-31 ENCOUNTER — Ambulatory Visit: Payer: PRIVATE HEALTH INSURANCE

## 2022-11-03 ENCOUNTER — Ambulatory Visit: Payer: PRIVATE HEALTH INSURANCE

## 2022-11-03 DIAGNOSIS — G4733 Obstructive sleep apnea (adult) (pediatric): Secondary | ICD-10-CM | POA: Diagnosis not present

## 2022-11-03 DIAGNOSIS — S78119A Complete traumatic amputation at level between unspecified hip and knee, initial encounter: Secondary | ICD-10-CM | POA: Diagnosis not present

## 2022-11-04 DIAGNOSIS — Z853 Personal history of malignant neoplasm of breast: Secondary | ICD-10-CM | POA: Diagnosis not present

## 2022-11-05 ENCOUNTER — Ambulatory Visit: Payer: PRIVATE HEALTH INSURANCE

## 2022-11-06 DIAGNOSIS — C50912 Malignant neoplasm of unspecified site of left female breast: Secondary | ICD-10-CM | POA: Diagnosis not present

## 2022-11-07 DIAGNOSIS — M1711 Unilateral primary osteoarthritis, right knee: Secondary | ICD-10-CM | POA: Diagnosis not present

## 2022-11-09 DIAGNOSIS — M1711 Unilateral primary osteoarthritis, right knee: Secondary | ICD-10-CM | POA: Insufficient documentation

## 2022-11-10 ENCOUNTER — Ambulatory Visit: Payer: PRIVATE HEALTH INSURANCE

## 2022-11-12 DIAGNOSIS — H524 Presbyopia: Secondary | ICD-10-CM | POA: Diagnosis not present

## 2022-11-13 ENCOUNTER — Ambulatory Visit: Payer: PRIVATE HEALTH INSURANCE | Admitting: Physical Therapy

## 2022-11-13 DIAGNOSIS — E782 Mixed hyperlipidemia: Secondary | ICD-10-CM | POA: Diagnosis not present

## 2022-11-13 DIAGNOSIS — G4733 Obstructive sleep apnea (adult) (pediatric): Secondary | ICD-10-CM | POA: Diagnosis not present

## 2022-11-13 DIAGNOSIS — I10 Essential (primary) hypertension: Secondary | ICD-10-CM | POA: Diagnosis not present

## 2022-11-13 DIAGNOSIS — E559 Vitamin D deficiency, unspecified: Secondary | ICD-10-CM | POA: Diagnosis not present

## 2022-11-13 DIAGNOSIS — R7302 Impaired glucose tolerance (oral): Secondary | ICD-10-CM | POA: Diagnosis not present

## 2022-11-18 ENCOUNTER — Other Ambulatory Visit: Payer: Self-pay

## 2022-11-18 ENCOUNTER — Ambulatory Visit: Payer: PRIVATE HEALTH INSURANCE | Admitting: Physical Therapy

## 2022-11-18 MED ORDER — VITAMIN D3 1.25 MG (50000 UT) PO CAPS
50000.0000 [IU] | ORAL_CAPSULE | ORAL | 3 refills | Status: DC
  Filled 2022-11-18: qty 12, 84d supply, fill #0

## 2022-11-19 ENCOUNTER — Ambulatory Visit
Admission: RE | Admit: 2022-11-19 | Discharge: 2022-11-19 | Disposition: A | Discharge status: Home / Self Care | Payer: 59 | Source: Ambulatory Visit | Attending: General Surgery | Admitting: General Surgery

## 2022-11-19 DIAGNOSIS — Z1231 Encounter for screening mammogram for malignant neoplasm of breast: Secondary | ICD-10-CM | POA: Insufficient documentation

## 2022-11-20 ENCOUNTER — Other Ambulatory Visit: Payer: Self-pay

## 2022-11-20 ENCOUNTER — Ambulatory Visit: Payer: PRIVATE HEALTH INSURANCE | Admitting: Physical Therapy

## 2022-11-26 ENCOUNTER — Ambulatory Visit: Payer: PRIVATE HEALTH INSURANCE | Admitting: Physical Therapy

## 2022-11-27 ENCOUNTER — Other Ambulatory Visit: Payer: Self-pay

## 2022-11-27 MED ORDER — ZEPBOUND 2.5 MG/0.5ML ~~LOC~~ SOAJ
2.5000 mg | SUBCUTANEOUS | 0 refills | Status: DC
  Filled 2022-11-27 – 2022-12-02 (×2): qty 2, 28d supply, fill #0

## 2022-11-28 ENCOUNTER — Ambulatory Visit: Payer: PRIVATE HEALTH INSURANCE | Admitting: Physical Therapy

## 2022-12-01 ENCOUNTER — Ambulatory Visit: Payer: PRIVATE HEALTH INSURANCE | Admitting: Physical Therapy

## 2022-12-02 ENCOUNTER — Other Ambulatory Visit: Payer: Self-pay

## 2022-12-03 ENCOUNTER — Other Ambulatory Visit: Payer: Self-pay

## 2022-12-03 ENCOUNTER — Ambulatory Visit: Payer: PRIVATE HEALTH INSURANCE | Admitting: Physical Therapy

## 2022-12-04 DIAGNOSIS — G4733 Obstructive sleep apnea (adult) (pediatric): Secondary | ICD-10-CM | POA: Diagnosis not present

## 2022-12-05 ENCOUNTER — Other Ambulatory Visit: Payer: Self-pay

## 2022-12-08 ENCOUNTER — Other Ambulatory Visit: Payer: Self-pay

## 2022-12-22 ENCOUNTER — Other Ambulatory Visit: Payer: Self-pay

## 2022-12-23 ENCOUNTER — Other Ambulatory Visit: Payer: Self-pay

## 2022-12-23 MED ORDER — ZEPBOUND 7.5 MG/0.5ML ~~LOC~~ SOAJ
7.5000 mg | SUBCUTANEOUS | 0 refills | Status: DC
  Filled 2022-12-27: qty 2, 28d supply, fill #0

## 2022-12-23 MED ORDER — ZEPBOUND 5 MG/0.5ML ~~LOC~~ SOAJ: 5.0000 mg | SUBCUTANEOUS | 0 refills | Status: DC

## 2022-12-28 ENCOUNTER — Other Ambulatory Visit: Payer: Self-pay

## 2023-01-04 DIAGNOSIS — G4733 Obstructive sleep apnea (adult) (pediatric): Secondary | ICD-10-CM | POA: Diagnosis not present

## 2023-01-06 ENCOUNTER — Encounter: Payer: Self-pay | Admitting: Surgery

## 2023-01-06 ENCOUNTER — Other Ambulatory Visit: Payer: Self-pay

## 2023-01-06 ENCOUNTER — Ambulatory Visit (INDEPENDENT_AMBULATORY_CARE_PROVIDER_SITE_OTHER): Payer: 59 | Admitting: Surgery

## 2023-01-06 VITALS — BP 123/79 | HR 75 | Temp 98.0°F | Ht 66.5 in | Wt 211.0 lb

## 2023-01-06 DIAGNOSIS — L723 Sebaceous cyst: Secondary | ICD-10-CM | POA: Diagnosis not present

## 2023-01-06 NOTE — Progress Notes (Signed)
Patient ID: Mandy Roberts, female   DOB: 09-12-1961, 62 y.o.   MRN: [id number]  Chief Complaint: Inflamed sebaceous cyst abdominal wall  History of Present Illness Mandy Roberts is a 62 y.o. female with right upper quadrant soft tissue mass consistent with an inflamed sebaceous cyst, present for a couple weeks.  Began taking some leftover Cipro she had had available from the prior infection.  Slight improvement, but without complete resolution with persistent tender mass present.  No significant drainage.  Denies fevers or chills.  Past Medical History Past Medical History:  Diagnosis Date   Atrial fibrillation ([place])    Breast cancer ([place]) 1990/1997    left mastectomy ,had recurrence in left suprclavic nodes several yrs ago-treated with radiation and chemo   CHF (congestive heart failure) ([place])    Colon polyp [id number]   Complication of anesthesia    Difficult intubation    expectorated blood 3-4 days after surgery in 2011   Difficult intubation    hypoxia with endoscopy in 2016   GERD (gastroesophageal reflux disease)    History of chemotherapy    5-FU/AC in 1990 under the care of Dr. Oliva Bustard. pt also tx with tamoxifen and arimidex   History of radiation therapy    radiation treat at [facility]   Hypertension 2008   Personal history of chemotherapy 1990   BREAST CA   Personal history of malignant neoplasm of breast 1990    left mastectomy    Personal history of radiation therapy 1997   BREAST CA   Sinusitis    Sleep apnea    history of, went away after gastric surgery      Past Surgical History:  Procedure Laterality Date   [place]   COLONOSCOPY  2008, 2015   Dr. Jamal Collin   COLONOSCOPY WITH PROPOFOL N/A 10/12/2019   Procedure: COLONOSCOPY WITH PROPOFOL;  Surgeon: Robert Bellow, MD;  Location: [facility] END[facility]COPY;  Service: Endoscopy;  Laterality: N/A;  DR BYRNETT WILL UPDATE H&P ON PROCEDURE DATE   DILATION AND CURETTAGE OF UTERUS     KNEE  ARTHROPLASTY Left 09/07/2017   Procedure: COMPUTER ASSISTED TOTAL KNEE ARTHROPLASTY;  Surgeon: Dereck Leep, MD;  Location: [facility]S;  Service: Orthopedics;  Laterality: Left;   KNEE ARTHROSCOPY Bilateral    KNEE ARTHROSCOPY Right 06/24/2022   Procedure: RIGHT KNEE ARTHROSCOPY;  Surgeon: Melrose Nakayama, MD;  Location: [facility];  Service: Orthopedics;  Laterality: Right;   KNEE SURGERY Left 2009, 2010, 2012   LAPAROSCOPIC GASTRIC RESTRICTIVE DUODENAL PROCEDURE (DUODENAL SWITCH) N/A 04/17/2015   Procedure: LAPAROSCOPIC GASTRIC RESTRICTIVE DUODENAL PROCEDURE, single anastamosis ;  Surgeon: Bonner Puna, MD;  Location: [facility] ORS;  Service: General;  Laterality: N/A;   MASTECTOMY Left 1990   BREAST CA   TONSILLECTOMY     UPPER GI ENDOSCOPY N/A 04/17/2015   Procedure: UPPER GI ENDOSCOPY;  Surgeon: Bonner Puna, MD;  Location: [facility] ORS;  Service: General;  Laterality: N/A;    Allergies  Allergen Reactions   Tdap [Tetanus-Diphth-Acell Pertussis] Shortness Of Breath   Penicillins Rash    Pt says she's not allergic to penicillin    Current Outpatient Medications  Medication Sig Dispense Refill   albuterol (VENTOLIN HFA) 108 (90 Base) MCG/ACT inhaler INHALE 2 PUFFS INTO THE LUNGS FOUR TIMES A DAY 6.7 g 1   carvedilol (COREG) 3.125 MG tablet twice a day 180 tablet 3   Cholecalciferol (VITAMIN D3) 1.25 MG (50000 UT) CAPS  Take 50,000 Units by mouth once a week. 12 capsule 3   furosemide (LASIX) 20 MG tablet Take 1 tablet (20 mg total) by mouth daily as needed. 30 tablet 11   Insulin Pen Needle 32G X 6 MM MISC Use with Ozempic 100 each 0   Multiple Vitamin (MULTIVITAMIN WITH MINERALS) TABS tablet Take 2 tablets by mouth daily.     NON FORMULARY Unifine Pentips 31 gauge x 3/16" needle     sacubitril-valsartan (ENTRESTO) 97-103 MG Take 1 tablet by mouth 2 (two) times daily. 60 tablet 6   tirzepatide (ZEPBOUND) 2.5 MG/0.5ML Pen Inject 2.5 mg into the skin once a week. 2 mL 0   tirzepatide (ZEPBOUND) 5  MG/0.5ML Pen Inject 5 mg into the skin once a week. 2 mL 0   tirzepatide (ZEPBOUND) 7.5 MG/0.5ML Pen Inject 7.5 mg into the skin once a week. 2 mL 0   No current facility-administered medications for this visit.    Family History Family History  Problem Relation Age of Onset   Heart failure Mother    Hypertension Mother    Hyperlipidemia Mother    Kidney disease Mother    CVA Mother    Hypothyroidism Mother    Breast cancer Maternal Aunt 76      Social History Social History   Tobacco Use   Smoking status: Never   Smokeless tobacco: Never  Vaping Use   Vaping Use: Never used  Substance Use Topics   Alcohol use: No   Drug use: No        Review of Systems  Constitutional:  Positive for malaise/fatigue.  HENT: Negative.    Eyes: Negative.   Respiratory: Negative.    Cardiovascular: Negative.   Gastrointestinal:  Positive for abdominal pain, constipation, heartburn and nausea.  Genitourinary: Negative.   Skin:  Positive for itching.  Neurological: Negative.   Psychiatric/Behavioral: Negative.       Physical Exam Blood pressure 123/79, pulse 75, temperature 98 F (36.7 C), temperature source Oral, height 5' 6.5" (1.689 m), weight 211 lb (95.7 kg). Last Weight  Most recent update: 01/06/2023  2:46 PM    Weight  95.7 kg (211 lb)             CONSTITUTIONAL: Well developed, and nourished, appropriately responsive and aware without distress.   EYES: Sclera non-icteric.   EARS, NOSE, MOUTH AND THROAT:  The oropharynx is clear. Oral mucosa is pink and moist.   Hearing is intact to voice.  NECK: Trachea is midline, and there is no jugular venous distension.  LYMPH NODES:  Lymph nodes in the neck are not appreciated. RESPIRATORY:  Lungs are clear, and breath sounds are equal bilaterally. Normal respiratory effort without pathologic use of accessory muscles. CARDIOVASCULAR: Heart is regular in rate and rhythm.  Well perfused.  GI: The abdomen is notable for an  indurated raised mass of approximately 2.5 to 3 cm upper right quadrant, exquisitely tender to touch, indurated with minimal central fluctuance.  Otherwise soft, nontender, and nondistended.  Evidence of prior abdominoplasty.  There were no other palpable masses.  I did not appreciate hepatosplenomegaly.  MUSCULOSKELETAL:  Symmetrical muscle tone appreciated in all four extremities.    SKIN: Skin turgor is normal. No pathologic skin lesions appreciated.  NEUROLOGIC:  Motor and sensation appear grossly normal.  Cranial nerves are grossly without defect. PSYCH:  Alert and oriented to person, place and time. Affect is appropriate for situation.  Data Reviewed I have personally reviewed what is currently  available of the patient's imaging, recent labs and medical records.   Labs:     Latest Ref Rng & Units 06/13/2022   11:24 AM 05/29/2020    4:44 AM 05/28/2020    7:20 AM  CBC  WBC 4.0 - 10.5 K/uL 3.3  7.5  9.8   Hemoglobin 12.0 - 15.0 g/dL 13.4  12.0  11.4   Hematocrit 36.0 - 46.0 % 41.9  35.7  35.0   Platelets 150 - 400 K/uL 145  140  124       Latest Ref Rng & Units 06/13/2022   11:24 AM 10/04/2021   11:05 AM 05/27/2020    4:34 AM  CMP  Glucose 70 - 99 mg/dL 84  91  146   BUN 8 - 23 mg/dL 15  11  13   $ Creatinine 0.44 - 1.00 mg/dL 0.75  0.71  0.78   Sodium 135 - 145 mmol/L 141  140  138   Potassium 3.5 - 5.1 mmol/L 3.9  4.5  4.5   Chloride 98 - 111 mmol/L 110  104  105   CO2 22 - 32 mmol/L 26  29  27   $ Calcium 8.9 - 10.3 mg/dL 9.6  9.4  9.2   Total Protein 6.5 - 8.1 g/dL  6.9    Total Bilirubin 0.3 - 1.2 mg/dL  0.8    Alkaline Phos 38 - 126 U/L  103    AST 15 - 41 U/L  22    ALT 0 - 44 U/L  28        Imaging: Radiological images reviewed:   Within last 24 hrs: No results found.  Assessment    Inflamed/infected sebaceous cyst right upper quadrant abdominal wall. Patient Active Problem List   Diagnosis Date Noted   Primary osteoarthritis of right knee 11/09/2022   Dental  abscess 05/27/2020   Abscess of mandible_left 05/26/2020   Thrombocytopenia ([place]) 05/26/2020   Facial cellulitis_left 05/26/2020   Varicose veins of both lower extremities with inflammation 05/12/2019   Chronic venous insufficiency 05/12/2019   Status post total left knee replacement 09/07/2017   Chronic bilateral thoracic back pain 09/01/2017   Neck pain 09/01/2017   Symptomatic mammary hypertrophy 09/01/2017   High serum ferritin 11/30/2015   Difficult airway for intubation 04/09/2015   Chronic nasal congestion 04/09/2015   Atrial fibrillation ([place]) 09/16/2013   Obstructive sleep apnea 09/16/2013   Morbid obesity ([place]) 09/16/2013   Personal history of breast cancer 02/09/2013   Hypertension    Cancer ([place])     Plan    Incision and drainage RUQ abdominal wall infected/inflammed sebaceous cyst.   Pre-operative Diagnosis: RUQ abdominal wall infected/inflammed sebaceous cyst.  Post-operative Diagnosis: same.    Surgeon: Ronny Bacon, M.D., FACS  Anesthesia: Local   Findings: Seborrheic content with purulent drainage  Estimated Blood Loss: 5 mL         Specimens: Roof of cyst and adjacent subcutaneous tissues sharply excised.  Discarded.          Complications: none              Procedure Details  The patient was evaluated, the benefits, complications, treatment options, and expected outcomes were discussed with the patient. The risks of bleeding, infection, recurrence of symptoms, failure to resolve symptoms, unanticipated injury, any of which could require further surgery were reviewed with the patient. The likelihood of improving the patient's symptoms with return to their baseline status is expected.  The patient and/or family  concurred with the proposed plan, giving informed consent.  The patient was taken to our procedure room, identified and the procedure verified.    The patient was positioned in the supine position and the abdominal wall was prepped with   Chloraprep and draped in the sterile fashion.  A Time Out was held and the above information confirmed.  Local infiltration of alkalinized 1% lidocaine with epinephrine is utilized adequate anesthetic effect.  I&D completed with an 11 blade, unroofing of the cyst completed with the same to allow adequate exposure/access to the deep cystic components with packing strip.  The wound was then irrigated, packed with half-inch wide iodoform packing strip.  It was then moistened with further local anesthetic, and covered with multiple layers of absorbent gauze.  This was then secured with Medipore tape.  She believes she can have her dressing changed by ED personnel on a daily basis.  We reviewed what needs to be done.  She will follow-up in 2 weeks.  Face-to-face time spent with the patient and accompanying care providers(if present) was 40 minutes, with more than 50% of the time spent counseling, educating, and coordinating care of the patient.    These notes generated with voice recognition software. I apologize for typographical errors.  Ronny Bacon M.D., FACS 01/06/2023, 3:15 PM

## 2023-01-06 NOTE — Patient Instructions (Signed)
Remove the packing. Shower as usual. Place a dry gauze over the area and have one of the ED nurses to pack the wound using 1/2 inch iodoform packing strip. Place a dry gauze over the area and secure with tape. You will need to do this daily.   Please see your follow up appointment listed below.

## 2023-01-13 ENCOUNTER — Encounter: Payer: Self-pay | Admitting: Surgery

## 2023-01-13 ENCOUNTER — Other Ambulatory Visit: Payer: Self-pay

## 2023-01-13 ENCOUNTER — Ambulatory Visit (INDEPENDENT_AMBULATORY_CARE_PROVIDER_SITE_OTHER): Payer: 59 | Admitting: Surgery

## 2023-01-13 VITALS — BP 128/76 | HR 73 | Temp 98.4°F | Ht 66.5 in | Wt 218.8 lb

## 2023-01-13 DIAGNOSIS — L723 Sebaceous cyst: Secondary | ICD-10-CM

## 2023-01-13 DIAGNOSIS — Z09 Encounter for follow-up examination after completed treatment for conditions other than malignant neoplasm: Secondary | ICD-10-CM

## 2023-01-13 MED ORDER — HYDROGEL GEL
1.0000 [drp] | Freq: Every day | 0 refills | Status: DC
  Filled 2023-01-13: qty 100, fill #0

## 2023-01-13 NOTE — Progress Notes (Signed)
Ms. Cipollone is right upper quadrant abdominal wound appears to be progressing well.  75 to 80% granulated, surrounding ring of thickened scar tissue, without evidence of tunneling, or undrained abscess present.  Slightly tender to dry Q-tip exploration.  But no evidence of nonviable tissue, or tracking.  We replaced the wound packing with saline moistened 2 x 2 gauze, gave her instructions regarding utilization of hydrogel for daily dressings.  She knows she may shower with the dressing off and replace the dressing on a daily basis, possibly twice daily as needed.  Will have her follow-up in couple weeks to see that her wound continues to make progress.

## 2023-01-13 NOTE — Patient Instructions (Addendum)
Please pick up your prescription at your pharmacy.   If you have any concerns or questions, please feel free to call our office.  Incision and Drainage, Care After This sheet gives you information about how to care for yourself after your procedure. Your health care provider may also give you more specific instructions. If you have problems or questions, contact your health care provider. What can I expect after the procedure? After the procedure, it is common to have: Pain or discomfort around the incision site. Blood, fluid, or pus (drainage) from the incision. Redness and firm skin around the incision site. Follow these instructions at home: Medicines Take over-the-counter and prescription medicines only as told by your health care provider. If you were prescribed an antibiotic medicine, use or take it as told by your health care provider. Do not stop using the antibiotic even if you start to feel better. Wound care Follow instructions from your health care provider about how to take care of your wound. Make sure you: Wash your hands with soap and water before and after you change your bandage (dressing). If soap and water are not available, use hand sanitizer. Change your dressing and packing as told by your health care provider. If your dressing is dry or stuck when you try to remove it, moisten or wet the dressing with saline or water so that it can be removed without harming your skin or tissues. If your wound is packed, leave it in place until your health care provider tells you to remove it. To remove the packing, moisten or wet the packing with saline or water so that it can be removed without harming your skin or tissues. Leave stitches (sutures), skin glue, or adhesive strips in place. These skin closures may need to stay in place for 2 weeks or longer. If adhesive strip edges start to loosen and curl up, you may trim the loose edges. Do not remove adhesive strips completely unless  your health care provider tells you to do that. Check your wound every day for signs of infection. Check for: More redness, swelling, or pain. More fluid or blood. Warmth. Pus or a bad smell. If you were sent home with a drain tube in place, follow instructions from your health care provider about: How to empty it. How to care for it at home.  General instructions Rest the affected area. Do not take baths, swim, or use a hot tub until your health care provider approves. Ask your health care provider if you may take showers. You may only be allowed to take sponge baths. Return to your normal activities as told by your health care provider. Ask your health care provider what activities are safe for you. Your health care provider may put you on activity or lifting restrictions. The incision will continue to drain. It is normal to have some clear or slightly bloody drainage. The amount of drainage should lessen each day. Do not apply any creams, ointments, or liquids unless you have been told to by your health care provider. Keep all follow-up visits as told by your health care provider. This is important. Contact a health care provider if: Your cyst or abscess returns. You have more redness, swelling, or pain around your incision. You have more fluid or blood coming from your incision. Your incision feels warm to the touch. You have pus or a bad smell coming from your incision. You have red streaks above or below the incision site. Get help right away  if: You have severe pain or bleeding. You cannot eat or drink without vomiting. You have a fever or chills. You have redness that spreads quickly. You have decreased urine output. You become short of breath. You have chest pain. You cough up blood. The affected area becomes numb or starts to tingle. These symptoms may represent a serious problem that is an emergency. Do not wait to see if the symptoms will go away. Get medical help right  away. Call your local emergency services (911 in the [place] Do not drive yourself to the hospital. Summary After this procedure, it is common to have fluid, blood, or pus coming from the surgery site. Follow all home care instructions. You will be told how to take care of your incision, how to check for infection, and how to take medicines. If you were prescribed an antibiotic medicine, take it as told by your health care provider. Do not stop taking the antibiotic even if you start to feel better. Contact a health care provider if you have increased redness, swelling, or pain around your incision. Get help right away if you have chest pain, you vomit, you cough up blood, or you have shortness of breath. Keep all follow-up visits as told by your health care provider. This is important. This information is not intended to replace advice given to you by your health care provider. Make sure you discuss any questions you have with your health care provider. Document Revised: 02/13/2022 Document Reviewed: 08/22/2021 Elsevier Patient Education  [place].

## 2023-01-21 ENCOUNTER — Other Ambulatory Visit: Payer: Self-pay

## 2023-01-21 ENCOUNTER — Other Ambulatory Visit: Payer: Self-pay | Admitting: Internal Medicine

## 2023-01-21 ENCOUNTER — Telehealth: Payer: Self-pay | Admitting: Cardiology

## 2023-01-21 NOTE — Telephone Encounter (Signed)
LVM x3 to r/s appt on 03/05 with Dr. Radford Pax. Will send mychart message

## 2023-01-22 ENCOUNTER — Other Ambulatory Visit: Payer: Self-pay

## 2023-01-22 ENCOUNTER — Encounter: Payer: Self-pay | Admitting: Internal Medicine

## 2023-01-22 ENCOUNTER — Ambulatory Visit: Payer: 59 | Admitting: Internal Medicine

## 2023-01-22 VITALS — BP 134/80 | HR 75 | Ht 66.5 in | Wt 215.4 lb

## 2023-01-22 DIAGNOSIS — E6609 Other obesity due to excess calories: Secondary | ICD-10-CM | POA: Insufficient documentation

## 2023-01-22 DIAGNOSIS — E559 Vitamin D deficiency, unspecified: Secondary | ICD-10-CM | POA: Diagnosis not present

## 2023-01-22 DIAGNOSIS — R7302 Impaired glucose tolerance (oral): Secondary | ICD-10-CM

## 2023-01-22 DIAGNOSIS — F411 Generalized anxiety disorder: Secondary | ICD-10-CM | POA: Diagnosis not present

## 2023-01-22 DIAGNOSIS — Z6835 Body mass index (BMI) 35.0-35.9, adult: Secondary | ICD-10-CM

## 2023-01-22 MED ORDER — TIRZEPATIDE-WEIGHT MANAGEMENT 12.5 MG/0.5ML ~~LOC~~ SOAJ
12.5000 mg | SUBCUTANEOUS | 0 refills | Status: DC
Start: 2023-01-22 — End: 2023-06-30
  Filled 2023-01-22: qty 2, 28d supply, fill #0

## 2023-01-22 MED ORDER — TIRZEPATIDE-WEIGHT MANAGEMENT 15 MG/0.5ML ~~LOC~~ SOAJ
15.0000 mg | SUBCUTANEOUS | 3 refills | Status: DC
  Filled 2023-01-22: qty 30, fill #0
  Filled 2023-02-16: qty 2, 28d supply, fill #0
  Filled 2023-03-14: qty 2, 28d supply, fill #1
  Filled 2023-06-16: qty 2, 28d supply, fill #2

## 2023-01-22 NOTE — Progress Notes (Signed)
Patient comes in to discuss  Established Patient Office Visit  Subjective:  Patient ID: Mandy Roberts, female    DOB: 12/10/1960  Age: 62 y.o. MRN: [id number]  Chief Complaint  Patient presents with   Follow-up    Follow up    Patient comes in to discuss her use of Zepbound  injections for weight loss.  She was previously using Ozempic and  had lost significant weight it.  But then it was not covered by her insurance and she had to stop it and since then she has regained some of the weight back.   She was prescribed Zepbound as it was covered and and her dose was being titrated up.  She is tolerating it quite well and does not complain of any nausea or vomiting.  But she has also not shown any significant weight loss on it.  Patient wants to continue using it and see how it works at reaching the highest dose possible.  Prescriptions were given to the patient.  She has no other complaints.     Past Medical History:  Diagnosis Date   Atrial fibrillation ([place])    Breast cancer [facility]) 1990/1997    left mastectomy ,had recurrence in left suprclavic nodes several yrs ago-treated with radiation and chemo   CHF (congestive heart failure) ([place])    Colon polyp [id number]   Complication of anesthesia    Difficult intubation    expectorated blood 3-4 days after surgery in 2011   Difficult intubation    hypoxia with endoscopy in 2016   GERD (gastroesophageal reflux disease)    History of chemotherapy    5-FU/AC in 1990 under the care of Dr. Oliva Bustard. pt also tx with tamoxifen and arimidex   History of radiation therapy    radiation treat at [facility]   Hypertension 2008   Personal history of chemotherapy 1990   BREAST CA   Personal history of malignant neoplasm of breast 1990    left mastectomy    Personal history of radiation therapy 1997   BREAST CA   Sinusitis    Sleep apnea    history of, went away after gastric surgery   Past Surgical History:  Procedure Laterality Date   [place]   COLONOSCOPY  2008, 2015   Dr. Jamal Collin   COLONOSCOPY WITH PROPOFOL N/A 10/12/2019   Procedure: COLONOSCOPY WITH PROPOFOL;  Surgeon: Robert Bellow, MD;  Location: [facility] END[facility];  Service: Endoscopy;  Laterality: N/A;  DR BYRNETT WILL UPDATE H&P ON PROCEDURE DATE   DILATION AND CURETTAGE OF UTERUS     KNEE ARTHROPLASTY Left 09/07/2017   Procedure: COMPUTER ASSISTED TOTAL KNEE ARTHROPLASTY;  Surgeon: Dereck Leep, MD;  Location: [facility]S;  Service: Orthopedics;  Laterality: Left;   KNEE ARTHROSCOPY Bilateral    KNEE ARTHROSCOPY Right 06/24/2022   Procedure: RIGHT KNEE ARTHROSCOPY;  Surgeon: Melrose Nakayama, MD;  Location: [facility];  Service: Orthopedics;  Laterality: Right;   KNEE SURGERY Left 2009, 2010, 2012   LAPAROSCOPIC GASTRIC RESTRICTIVE DUODENAL PROCEDURE (DUODENAL SWITCH) N/A 04/17/2015   Procedure: LAPAROSCOPIC GASTRIC RESTRICTIVE DUODENAL PROCEDURE, single anastamosis ;  Surgeon: Bonner Puna, MD;  Location: [facility] ORS;  Service: General;  Laterality: N/A;   MASTECTOMY Left 1990   BREAST CA   TONSILLECTOMY     UPPER GI ENDOSCOPY N/A 04/17/2015   Procedure: UPPER GI ENDOSCOPY;  Surgeon: Bonner Puna, MD;  Location: [facility] ORS;  Service: General;  Laterality:  N/A;    Social History   Socioeconomic History   Marital status: Widowed    Spouse name: Not on file   Number of children: Not on file   Years of education: Not on file   Highest education level: Not on file  Occupational History   Not on file  Tobacco Use   Smoking status: Never   Smokeless tobacco: Never  Vaping Use   Vaping Use: Never used  Substance and Sexual Activity   Alcohol use: No   Drug use: No   Sexual activity: Not on file  Other Topics Concern   Not on file  Social History Narrative   Not on file   Social Determinants of Health   Financial Resource Strain: Not on file  Food Insecurity: Not on file  Transportation Needs: Not on file  Physical Activity: Not on file   Stress: Not on file  Social Connections: Not on file  Intimate Partner Violence: Not on file    Family History  Problem Relation Age of Onset   Heart failure Mother    Hypertension Mother    Hyperlipidemia Mother    Kidney disease Mother    CVA Mother    Hypothyroidism Mother    Breast cancer Maternal Aunt 23    Allergies  Allergen Reactions   Tdap [Tetanus-Diphth-Acell Pertussis] Shortness Of Breath   Penicillins Rash    Pt says she's not allergic to penicillin    Review of Systems  Constitutional: Negative.   HENT: Negative.    Eyes: Negative.   Respiratory: Negative.    Cardiovascular: Negative.   Gastrointestinal: Negative.   Genitourinary: Negative.   Musculoskeletal: Negative.   Skin: Negative.   Neurological: Negative.   Endo/Heme/Allergies: Negative.   Psychiatric/Behavioral: Negative.         Objective:   BP 134/80   Pulse 75   Ht 5' 6.5" (1.689 m)   Wt 215 lb 6.4 oz (97.7 kg)   LMP  (LMP Unknown)   SpO2 96%   BMI 34.25 kg/m   Vitals:   01/22/23 1417  BP: 134/80  Pulse: 75  Height: 5' 6.5" (1.689 m)  Weight: 215 lb 6.4 oz (97.7 kg)  SpO2: 96%  BMI (Calculated): 34.25    Physical Exam Vitals and nursing note reviewed.  Constitutional:      Appearance: Normal appearance.  HENT:     Head: Normocephalic.  Cardiovascular:     Rate and Rhythm: Normal rate.     Pulses: Normal pulses.     Heart sounds: Normal heart sounds.  Pulmonary:     Effort: Pulmonary effort is normal.     Breath sounds: Normal breath sounds.  Abdominal:     General: Abdomen is flat.     Palpations: Abdomen is soft.  Musculoskeletal:        General: Normal range of motion.     Cervical back: Normal range of motion and neck supple.  Skin:    General: Skin is warm.  Neurological:     General: No focal deficit present.     Mental Status: She is alert and oriented to person, place, and time.      No results found for any visits on 01/22/23.  No results  found for this or any previous visit (from the past 2160 hour(s)).    Assessment & Plan:   Problem List Items Addressed This Visit     GAD (generalized anxiety disorder)   Impaired glucose tolerance   Vitamin D  deficiency   Class 2 obesity due to excess calories without serious comorbidity with body mass index (BMI) of 35.0 to 35.9 in adult - Primary   Relevant Medications   tirzepatide (ZEPBOUND) 12.5 MG/0.5ML Pen   tirzepatide (ZEPBOUND) 15 MG/0.5ML Pen    Return in about 2 months (around 03/23/2023).   Total time spent: 30 minutes  Perrin Maltese, MD  01/22/2023

## 2023-01-27 ENCOUNTER — Ambulatory Visit: Payer: Self-pay | Admitting: Cardiology

## 2023-02-02 DIAGNOSIS — G4733 Obstructive sleep apnea (adult) (pediatric): Secondary | ICD-10-CM | POA: Diagnosis not present

## 2023-02-16 ENCOUNTER — Other Ambulatory Visit: Payer: Self-pay

## 2023-03-05 DIAGNOSIS — G4733 Obstructive sleep apnea (adult) (pediatric): Secondary | ICD-10-CM | POA: Diagnosis not present

## 2023-04-04 DIAGNOSIS — G4733 Obstructive sleep apnea (adult) (pediatric): Secondary | ICD-10-CM | POA: Diagnosis not present

## 2023-05-05 DIAGNOSIS — G4733 Obstructive sleep apnea (adult) (pediatric): Secondary | ICD-10-CM | POA: Diagnosis not present

## 2023-06-03 ENCOUNTER — Other Ambulatory Visit: Payer: Self-pay

## 2023-06-16 ENCOUNTER — Other Ambulatory Visit: Payer: Self-pay

## 2023-06-17 ENCOUNTER — Other Ambulatory Visit: Payer: Self-pay

## 2023-06-29 ENCOUNTER — Encounter: Payer: 59 | Admitting: Internal Medicine

## 2023-06-30 ENCOUNTER — Encounter: Payer: Self-pay | Admitting: Internal Medicine

## 2023-06-30 ENCOUNTER — Other Ambulatory Visit: Payer: Self-pay

## 2023-06-30 ENCOUNTER — Ambulatory Visit: Payer: 59 | Admitting: Internal Medicine

## 2023-06-30 ENCOUNTER — Other Ambulatory Visit (HOSPITAL_COMMUNITY): Payer: Self-pay | Admitting: Internal Medicine

## 2023-06-30 VITALS — BP 145/80 | HR 79 | Ht 66.5 in | Wt 212.2 lb

## 2023-06-30 DIAGNOSIS — E782 Mixed hyperlipidemia: Secondary | ICD-10-CM | POA: Diagnosis not present

## 2023-06-30 DIAGNOSIS — R7303 Prediabetes: Secondary | ICD-10-CM | POA: Diagnosis not present

## 2023-06-30 DIAGNOSIS — Z6835 Body mass index (BMI) 35.0-35.9, adult: Secondary | ICD-10-CM

## 2023-06-30 DIAGNOSIS — I480 Paroxysmal atrial fibrillation: Secondary | ICD-10-CM | POA: Diagnosis not present

## 2023-06-30 DIAGNOSIS — R5383 Other fatigue: Secondary | ICD-10-CM

## 2023-06-30 DIAGNOSIS — E538 Deficiency of other specified B group vitamins: Secondary | ICD-10-CM | POA: Diagnosis not present

## 2023-06-30 DIAGNOSIS — E559 Vitamin D deficiency, unspecified: Secondary | ICD-10-CM

## 2023-06-30 DIAGNOSIS — E6609 Other obesity due to excess calories: Secondary | ICD-10-CM

## 2023-06-30 DIAGNOSIS — M816 Localized osteoporosis [Lequesne]: Secondary | ICD-10-CM

## 2023-06-30 DIAGNOSIS — I10 Essential (primary) hypertension: Secondary | ICD-10-CM

## 2023-06-30 DIAGNOSIS — Z0000 Encounter for general adult medical examination without abnormal findings: Secondary | ICD-10-CM

## 2023-06-30 LAB — POCT URINALYSIS DIPSTICK
Blood, UA: NEGATIVE
Glucose, UA: NEGATIVE
Ketones, UA: NEGATIVE
Nitrite, UA: NEGATIVE
Protein, UA: POSITIVE — AB
Spec Grav, UA: >=1.03 — AB (ref 1.010–1.025)
Urobilinogen, UA: 1 E.U./dL
pH, UA: 6 (ref 5.0–8.0)

## 2023-06-30 MED ORDER — CARVEDILOL 3.125 MG PO TABS
3.1250 mg | ORAL_TABLET | Freq: Two times a day (BID) | ORAL | 3 refills | Status: DC
  Filled 2023-06-30: qty 180, 90d supply, fill #0

## 2023-06-30 MED ORDER — ENTRESTO 97-103 MG PO TABS
1.0000 | ORAL_TABLET | Freq: Two times a day (BID) | ORAL | 6 refills | Status: DC
Start: 2023-06-30 — End: 2024-09-08
  Filled 2023-06-30: qty 60, 30d supply, fill #0

## 2023-06-30 NOTE — Progress Notes (Signed)
Established Patient Office Visit  Subjective:  Patient ID: Mandy Roberts, female    DOB: 1961/09/22  Age: 62 y.o. MRN: [id number]  Chief Complaint  Patient presents with   Annual Exam    PHYSICAL    Patient comes in today for her annual physical.  She is generally feeling well and has no new complaints.  However her blood pressure is high.  Patient admits that she is not taking her Entresto and Coreg as prescribed by her cardiologist.  Needs refills, will send them.  Patient also advised to make a follow-up appointment with her cardiologist. She gets regular mammograms through her breast cancer surgeon. She does not need Pap smears.  Will schedule for DEXA scan.  Her colonoscopy is up to date. Will check labs today. Currently taking Zepbound injections 15 mg/week.    No other concerns at this time.   Past Medical History:  Diagnosis Date   Atrial fibrillation (HCC)    Breast cancer [facility]) 1990/1997    left mastectomy ,had recurrence in left suprclavic nodes several yrs ago-treated with radiation and chemo   CHF (congestive heart failure) (HCC)    Colon polyp 2008   Complication of anesthesia    Difficult intubation    expectorated blood 3-4 days after surgery in 2011   Difficult intubation    hypoxia with endoscopy in 2016   GERD (gastroesophageal reflux disease)    History of chemotherapy    5-FU/AC in 1990 under the care of Dr. Doylene Canning. pt also tx with tamoxifen and arimidex   History of radiation therapy    radiation treat at [facility]   Hypertension 2008   Personal history of chemotherapy 1990   BREAST CA   Personal history of malignant neoplasm of breast 1990    left mastectomy    Personal history of radiation therapy 1997   BREAST CA   Sinusitis    Sleep apnea    history of, went away after gastric surgery    Past Surgical History:  Procedure Laterality Date   abdomenoplasty     ABDOMINAL HYSTERECTOMY  1996   COLONOSCOPY  2008, 2015   Dr. Evette Cristal    COLONOSCOPY WITH PROPOFOL N/A 10/12/2019   Procedure: COLONOSCOPY WITH PROPOFOL;  Surgeon: Earline Mayotte, MD;  Location: [facility] END[facility];  Service: Endoscopy;  Laterality: N/A;  DR BYRNETT WILL UPDATE H&P ON PROCEDURE DATE   DILATION AND CURETTAGE OF UTERUS     KNEE ARTHROPLASTY Left 09/07/2017   Procedure: COMPUTER ASSISTED TOTAL KNEE ARTHROPLASTY;  Surgeon: Donato Heinz, MD;  Location: [facility] ORS;  Service: Orthopedics;  Laterality: Left;   KNEE ARTHROSCOPY Bilateral    KNEE ARTHROSCOPY Right 06/24/2022   Procedure: RIGHT KNEE ARTHROSCOPY;  Surgeon: Marcene Corning, MD;  Location: [facility];  Service: Orthopedics;  Laterality: Right;   KNEE SURGERY Left 2009, 2010, 2012   LAPAROSCOPIC GASTRIC RESTRICTIVE DUODENAL PROCEDURE (DUODENAL SWITCH) N/A 04/17/2015   Procedure: LAPAROSCOPIC GASTRIC RESTRICTIVE DUODENAL PROCEDURE, single anastamosis ;  Surgeon: Geoffry Paradise, MD;  Location: [facility] ORS;  Service: General;  Laterality: N/A;   MASTECTOMY Left 1990   BREAST CA   TONSILLECTOMY     UPPER GI ENDOSCOPY N/A 04/17/2015   Procedure: UPPER GI ENDOSCOPY;  Surgeon: Geoffry Paradise, MD;  Location: [facility] ORS;  Service: General;  Laterality: N/A;    Social History   Socioeconomic History   Marital status: Widowed    Spouse name: Not on file   Number of children: Not on  file   Years of education: Not on file   Highest education level: Not on file  Occupational History   Not on file  Tobacco Use   Smoking status: Never   Smokeless tobacco: Never  Vaping Use   Vaping status: Never Used  Substance and Sexual Activity   Alcohol use: No   Drug use: No   Sexual activity: Not on file  Other Topics Concern   Not on file  Social History Narrative   Not on file   Social Determinants of Health   Financial Resource Strain: Not on file  Food Insecurity: Not on file  Transportation Needs: Not on file  Physical Activity: Not on file  Stress: Not on file  Social Connections: Not on file  Intimate  Partner Violence: Not on file    Family History  Problem Relation Age of Onset   Heart failure Mother    Hypertension Mother    Hyperlipidemia Mother    Kidney disease Mother    CVA Mother    Hypothyroidism Mother    Breast cancer Maternal Aunt 22    Allergies  Allergen Reactions   Tdap [Tetanus-Diphth-Acell Pertussis] Shortness Of Breath   Penicillins Rash    Pt says she's not allergic to penicillin    Review of Systems  Constitutional: Negative.   HENT: Negative.    Eyes: Negative.   Respiratory: Negative.  Negative for cough and shortness of breath.   Cardiovascular: Negative.  Negative for chest pain, palpitations and leg swelling.  Gastrointestinal: Negative.  Negative for abdominal pain, constipation, diarrhea, heartburn, nausea and vomiting.  Genitourinary: Negative.  Negative for dysuria and flank pain.  Musculoskeletal: Negative.  Negative for joint pain and myalgias.  Skin: Negative.   Neurological: Negative.  Negative for dizziness and headaches.  Endo/Heme/Allergies: Negative.   Psychiatric/Behavioral: Negative.  Negative for depression and suicidal ideas. The patient is not nervous/anxious.        Objective:   BP (!) 145/80   Pulse 79   Ht 5' 6.5" (1.689 m)   Wt 212 lb 3.2 oz (96.3 kg)   LMP  (LMP Unknown)   SpO2 99%   BMI 33.74 kg/m   Vitals:   06/30/23 1319  BP: (!) 145/80  Pulse: 79  Height: 5' 6.5" (1.689 m)  Weight: 212 lb 3.2 oz (96.3 kg)  SpO2: 99%  BMI (Calculated): 33.74    Physical Exam Vitals and nursing note reviewed.  Constitutional:      Appearance: Normal appearance.  HENT:     Head: Normocephalic and atraumatic.     Nose: Nose normal.     Mouth/Throat:     Mouth: Mucous membranes are moist.     Pharynx: Oropharynx is clear.  Eyes:     Conjunctiva/sclera: Conjunctivae normal.     Pupils: Pupils are equal, round, and reactive to light.  Cardiovascular:     Rate and Rhythm: Normal rate and regular rhythm.     Pulses:  Normal pulses.     Heart sounds: Normal heart sounds. No murmur heard. Pulmonary:     Effort: Pulmonary effort is normal.     Breath sounds: Normal breath sounds. No wheezing.  Abdominal:     General: Bowel sounds are normal.     Palpations: Abdomen is soft.     Tenderness: There is no abdominal tenderness. There is no right CVA tenderness or left CVA tenderness.  Musculoskeletal:        General: Normal range of motion.  Cervical back: Normal range of motion.     Right lower leg: No edema.     Left lower leg: No edema.  Skin:    General: Skin is warm and dry.  Neurological:     General: No focal deficit present.     Mental Status: She is alert and oriented to person, place, and time.  Psychiatric:        Mood and Affect: Mood normal.        Behavior: Behavior normal.      Results for orders placed or performed in visit on 06/30/23  POCT Urinalysis Dipstick (81002)  Result Value Ref Range   Color, UA     Clarity, UA     Glucose, UA Negative Negative   Bilirubin, UA small    Ketones, UA negative    Spec Grav, UA >=1.030 (A) 1.010 - 1.025   Blood, UA negative    pH, UA 6.0 5.0 - 8.0   Protein, UA Positive (A) Negative   Urobilinogen, UA 1.0 0.2 or 1.0 E.U./dL   Nitrite, UA negative    Leukocytes, UA Trace (A) Negative   Appearance     Odor      Recent Results (from the past 2160 hour(s))  POCT Urinalysis Dipstick (14782)     Status: Abnormal   Collection Time: 06/30/23  1:59 PM  Result Value Ref Range   Color, UA     Clarity, UA     Glucose, UA Negative Negative   Bilirubin, UA small    Ketones, UA negative    Spec Grav, UA >=1.030 (A) 1.010 - 1.025   Blood, UA negative    pH, UA 6.0 5.0 - 8.0   Protein, UA Positive (A) Negative   Urobilinogen, UA 1.0 0.2 or 1.0 E.U./dL   Nitrite, UA negative    Leukocytes, UA Trace (A) Negative   Appearance     Odor        Assessment & Plan:  Check blood work today.  Needs follow-up with her cardiologist.  Resume  her Entresto and Coreg. Problem List Items Addressed This Visit     Essential hypertension, benign   Relevant Medications   sacubitril-valsartan (ENTRESTO) 97-103 MG   carvedilol (COREG) 3.125 MG tablet   Other Relevant Orders   CMP14+EGFR   Atrial fibrillation (HCC)   Relevant Medications   sacubitril-valsartan (ENTRESTO) 97-103 MG   carvedilol (COREG) 3.125 MG tablet   Other Relevant Orders   CBC With Differential   Vitamin D deficiency   Relevant Orders   Vitamin D (25 hydroxy)   Class 2 obesity due to excess calories without serious comorbidity with body mass index (BMI) of 35.0 to 35.9 in adult   Prediabetes   Relevant Orders   Hemoglobin A1c   Mixed hyperlipidemia   Relevant Medications   sacubitril-valsartan (ENTRESTO) 97-103 MG   carvedilol (COREG) 3.125 MG tablet   Other Relevant Orders   Lipid Panel w/o Chol/HDL Ratio   Other Visit Diagnoses     Annual physical exam    -  Primary   Localized osteoporosis without current pathological fracture       Relevant Orders   DG Bone Density   POCT Urinalysis Dipstick (95621) (Completed)   Vitamin B12 deficiency       Relevant Orders   Vitamin B12   Other fatigue       Relevant Orders   TSH       Return in about 4 months (around 10/30/2023).  Total time spent: 30 minutes  Margaretann Loveless, MD  06/30/2023   This document may have been prepared by [facility] Voice Recognition software and as such may include unintentional dictation errors.

## 2023-07-01 ENCOUNTER — Other Ambulatory Visit: Payer: Self-pay

## 2023-07-01 MED ORDER — FUROSEMIDE 20 MG PO TABS
20.0000 mg | ORAL_TABLET | Freq: Every day | ORAL | 0 refills | Status: AC | PRN
  Filled 2023-07-01: qty 10, 10d supply, fill #0

## 2023-07-02 DIAGNOSIS — S301XXA Contusion of abdominal wall, initial encounter: Secondary | ICD-10-CM | POA: Diagnosis not present

## 2023-07-07 ENCOUNTER — Other Ambulatory Visit: Payer: 59

## 2023-07-21 ENCOUNTER — Other Ambulatory Visit: Payer: 59

## 2023-07-28 ENCOUNTER — Other Ambulatory Visit: Payer: Self-pay | Admitting: Internal Medicine

## 2023-07-28 ENCOUNTER — Ambulatory Visit (INDEPENDENT_AMBULATORY_CARE_PROVIDER_SITE_OTHER): Payer: 59

## 2023-07-28 DIAGNOSIS — M816 Localized osteoporosis [Lequesne]: Secondary | ICD-10-CM

## 2023-07-28 DIAGNOSIS — M8589 Other specified disorders of bone density and structure, multiple sites: Secondary | ICD-10-CM

## 2023-07-28 NOTE — Progress Notes (Signed)
Patient notified

## 2023-08-05 DIAGNOSIS — G4733 Obstructive sleep apnea (adult) (pediatric): Secondary | ICD-10-CM | POA: Diagnosis not present

## 2023-08-31 ENCOUNTER — Other Ambulatory Visit: Payer: Self-pay

## 2023-08-31 DIAGNOSIS — S66912A Strain of unspecified muscle, fascia and tendon at wrist and hand level, left hand, initial encounter: Secondary | ICD-10-CM | POA: Diagnosis not present

## 2023-08-31 MED ORDER — MELOXICAM 15 MG PO TABS
15.0000 mg | ORAL_TABLET | Freq: Every day | ORAL | 0 refills | Status: DC
  Filled 2023-08-31: qty 30, 30d supply, fill #0

## 2023-09-10 ENCOUNTER — Other Ambulatory Visit: Payer: Self-pay

## 2023-09-24 ENCOUNTER — Other Ambulatory Visit: Payer: Self-pay | Admitting: General Surgery

## 2023-09-24 DIAGNOSIS — Z1231 Encounter for screening mammogram for malignant neoplasm of breast: Secondary | ICD-10-CM

## 2023-10-26 ENCOUNTER — Other Ambulatory Visit: Payer: Self-pay | Admitting: Internal Medicine

## 2023-10-26 ENCOUNTER — Telehealth: Payer: Self-pay | Admitting: Internal Medicine

## 2023-10-26 ENCOUNTER — Other Ambulatory Visit: Payer: Self-pay

## 2023-10-26 DIAGNOSIS — R5383 Other fatigue: Secondary | ICD-10-CM

## 2023-10-26 DIAGNOSIS — M810 Age-related osteoporosis without current pathological fracture: Secondary | ICD-10-CM

## 2023-10-26 DIAGNOSIS — I10 Essential (primary) hypertension: Secondary | ICD-10-CM

## 2023-10-26 DIAGNOSIS — E782 Mixed hyperlipidemia: Secondary | ICD-10-CM

## 2023-10-26 DIAGNOSIS — R7303 Prediabetes: Secondary | ICD-10-CM

## 2023-10-26 NOTE — Telephone Encounter (Signed)
Patient called in stating she sees Dr. Welton Flakes this Friday and is having labs drawn for endo on Thursday and would like to have any labs needed drawn on Thursday. Can you please place orders for anything you would like to check so she only has to have bloodwork done once.

## 2023-10-27 ENCOUNTER — Other Ambulatory Visit: Payer: Self-pay

## 2023-10-27 DIAGNOSIS — M810 Age-related osteoporosis without current pathological fracture: Secondary | ICD-10-CM

## 2023-10-29 ENCOUNTER — Other Ambulatory Visit
Admission: RE | Admit: 2023-10-29 | Discharge: 2023-10-29 | Disposition: A | Discharge status: Home / Self Care | Payer: 59 | Source: Ambulatory Visit | Attending: Internal Medicine | Admitting: Internal Medicine

## 2023-10-29 ENCOUNTER — Other Ambulatory Visit
Admission: RE | Admit: 2023-10-29 | Discharge: 2023-10-29 | Disposition: A | Discharge status: Home / Self Care | Payer: 59 | Attending: "Endocrinology | Admitting: "Endocrinology

## 2023-10-29 ENCOUNTER — Other Ambulatory Visit: Payer: Self-pay

## 2023-10-29 DIAGNOSIS — M810 Age-related osteoporosis without current pathological fracture: Secondary | ICD-10-CM | POA: Insufficient documentation

## 2023-10-29 LAB — COMPREHENSIVE METABOLIC PANEL
ALT: 26 U/L (ref 0–44)
AST: 24 U/L (ref 15–41)
Albumin: 4 g/dL (ref 3.5–5.0)
Alkaline Phosphatase: 126 U/L (ref 38–126)
Anion gap: 8 (ref 5–15)
BUN: 13 mg/dL (ref 8–23)
CO2: 25 mmol/L (ref 22–32)
Calcium: 8.9 mg/dL (ref 8.9–10.3)
Chloride: 107 mmol/L (ref 98–111)
Creatinine, Ser: 0.67 mg/dL (ref 0.44–1.00)
GFR, Estimated: >60 mL/min (ref >60)
Glucose, Bld: 80 mg/dL (ref 70–99)
Potassium: 4.2 mmol/L (ref 3.5–5.1)
Sodium: 140 mmol/L (ref 135–145)
Total Bilirubin: 0.6 mg/dL (ref <1.2)
Total Protein: 7.3 g/dL (ref 6.5–8.1)

## 2023-10-29 LAB — RENAL FUNCTION PANEL
Albumin: 3.8 g/dL (ref 3.5–5.0)
Anion gap: 7 (ref 5–15)
BUN: 14 mg/dL (ref 8–23)
CO2: 26 mmol/L (ref 22–32)
Calcium: 8.9 mg/dL (ref 8.9–10.3)
Chloride: 107 mmol/L (ref 98–111)
Creatinine, Ser: 0.68 mg/dL (ref 0.44–1.00)
GFR, Estimated: >60 mL/min (ref >60)
Glucose, Bld: 79 mg/dL (ref 70–99)
Phosphorus: 3.5 mg/dL (ref 2.5–4.6)
Potassium: 4.3 mmol/L (ref 3.5–5.1)
Sodium: 140 mmol/L (ref 135–145)

## 2023-10-29 LAB — CBC WITH DIFFERENTIAL/PLATELET
Abs Immature Granulocytes: 0.01 10*3/uL (ref 0.00–0.07)
Basophils Absolute: 0 10*3/uL (ref 0.0–0.1)
Basophils Relative: 0 %
Eosinophils Absolute: 0.1 10*3/uL (ref 0.0–0.5)
Eosinophils Relative: 3 %
HCT: 41 % (ref 36.0–46.0)
Hemoglobin: 13.2 g/dL (ref 12.0–15.0)
Immature Granulocytes: 0 %
Lymphocytes Relative: 40 %
Lymphs Abs: 1.2 10*3/uL (ref 0.7–4.0)
MCH: 29.1 pg (ref 26.0–34.0)
MCHC: 32.2 g/dL (ref 30.0–36.0)
MCV: 90.5 fL (ref 80.0–100.0)
Monocytes Absolute: 0.2 10*3/uL (ref 0.1–1.0)
Monocytes Relative: 7 %
Neutro Abs: 1.5 10*3/uL — ABNORMAL LOW (ref 1.7–7.7)
Neutrophils Relative %: 50 %
Platelets: 136 10*3/uL — ABNORMAL LOW (ref 150–400)
RBC: 4.53 MIL/uL (ref 3.87–5.11)
RDW: 12.8 % (ref 11.5–15.5)
WBC: 3 10*3/uL — ABNORMAL LOW (ref 4.0–10.5)
nRBC: 0 % (ref 0.0–0.2)

## 2023-10-29 LAB — LIPID PANEL
Cholesterol: 144 mg/dL (ref 0–200)
HDL: 58 mg/dL (ref >40)
LDL Cholesterol: 79 mg/dL (ref 0–99)
Total CHOL/HDL Ratio: 2.5 {ratio}
Triglycerides: 36 mg/dL (ref <150)
VLDL: 7 mg/dL (ref 0–40)

## 2023-10-29 LAB — HEMOGLOBIN A1C
Hgb A1c MFr Bld: 5.1 % (ref 4.8–5.6)
Mean Plasma Glucose: 99.67 mg/dL

## 2023-10-29 LAB — MAGNESIUM: Magnesium: 2.4 mg/dL (ref 1.7–2.4)

## 2023-10-29 LAB — VITAMIN D 25 HYDROXY (VIT D DEFICIENCY, FRACTURES): Vit D, 25-Hydroxy: 17.17 ng/mL — ABNORMAL LOW (ref 30–100)

## 2023-10-30 ENCOUNTER — Encounter: Payer: Self-pay | Admitting: Internal Medicine

## 2023-10-30 ENCOUNTER — Other Ambulatory Visit: Payer: Self-pay

## 2023-10-30 ENCOUNTER — Ambulatory Visit (INDEPENDENT_AMBULATORY_CARE_PROVIDER_SITE_OTHER): Payer: 59 | Admitting: Internal Medicine

## 2023-10-30 VITALS — BP 130/90 | HR 79 | Ht 66.5 in | Wt 226.2 lb

## 2023-10-30 DIAGNOSIS — E538 Deficiency of other specified B group vitamins: Secondary | ICD-10-CM

## 2023-10-30 DIAGNOSIS — E66812 Obesity, class 2: Secondary | ICD-10-CM | POA: Diagnosis not present

## 2023-10-30 DIAGNOSIS — I10 Essential (primary) hypertension: Secondary | ICD-10-CM

## 2023-10-30 DIAGNOSIS — E782 Mixed hyperlipidemia: Secondary | ICD-10-CM

## 2023-10-30 DIAGNOSIS — E559 Vitamin D deficiency, unspecified: Secondary | ICD-10-CM | POA: Diagnosis not present

## 2023-10-30 DIAGNOSIS — E6609 Other obesity due to excess calories: Secondary | ICD-10-CM | POA: Diagnosis not present

## 2023-10-30 DIAGNOSIS — I480 Paroxysmal atrial fibrillation: Secondary | ICD-10-CM | POA: Diagnosis not present

## 2023-10-30 DIAGNOSIS — R7303 Prediabetes: Secondary | ICD-10-CM

## 2023-10-30 DIAGNOSIS — Z6835 Body mass index (BMI) 35.0-35.9, adult: Secondary | ICD-10-CM

## 2023-10-30 LAB — PTH, INTACT AND CALCIUM
Calcium, Total (PTH): 8.9 mg/dL (ref 8.7–10.3)
PTH: 69 pg/mL — ABNORMAL HIGH (ref 15–65)

## 2023-10-30 LAB — CALCITRIOL (1,25 DI-OH VIT D): Vit D, 1,25-Dihydroxy: 99.6 pg/mL — ABNORMAL HIGH (ref 24.8–81.5)

## 2023-10-30 MED ORDER — NALTREXONE-BUPROPION HCL ER 8-90 MG PO TB12
ORAL_TABLET | ORAL | 1 refills | Status: DC
  Filled 2023-10-30: qty 120, fill #0
  Filled 2023-10-30 – 2023-11-10 (×3): qty 120, 30d supply, fill #0
  Filled 2023-11-11: qty 120, 60d supply, fill #0

## 2023-10-30 MED ORDER — VITAMIN D3 1.25 MG (50000 UT) PO CAPS
50000.0000 [IU] | ORAL_CAPSULE | ORAL | 3 refills | Status: AC
  Filled 2023-10-30 – 2023-11-11 (×2): qty 12, 84d supply, fill #0

## 2023-10-30 NOTE — Progress Notes (Signed)
Established Patient Office Visit  Subjective:  Patient ID: Mandy Roberts, female    DOB: 11/30/1960  Age: 62 y.o. MRN: [id number]  Chief Complaint  Patient presents with   Follow-up    4 mo    Patient comes in for her follow-up today.  Her insurance is no longer covering her Zepbound injections.  So she has gained weight. No complaints of chest pain, no shortness of breath and no palpitations.  Her blood pressure is high today as there was a lot of stress at work.  However she is advised to follow-up with her cardiologist for medication management. Would like to try Contrave to help with weight loss. Labs discussed.    No other concerns at this time.   Past Medical History:  Diagnosis Date   Atrial fibrillation (HCC)    Breast cancer [facility]) 1990/1997    left mastectomy ,had recurrence in left suprclavic nodes several yrs ago-treated with radiation and chemo   CHF (congestive heart failure) (HCC)    Colon polyp 2008   Complication of anesthesia    Difficult intubation    expectorated blood 3-4 days after surgery in 2011   Difficult intubation    hypoxia with endoscopy in 2016   GERD (gastroesophageal reflux disease)    History of chemotherapy    5-FU/AC in 1990 under the care of Dr. Doylene Canning. pt also tx with tamoxifen and arimidex   History of radiation therapy    radiation treat at [facility]   Hypertension 2008   Personal history of chemotherapy 1990   BREAST CA   Personal history of malignant neoplasm of breast 1990    left mastectomy    Personal history of radiation therapy 1997   BREAST CA   Sinusitis    Sleep apnea    history of, went away after gastric surgery    Past Surgical History:  Procedure Laterality Date   abdomenoplasty     ABDOMINAL HYSTERECTOMY  1996   COLONOSCOPY  2008, 2015   Dr. Evette Cristal   COLONOSCOPY WITH PROPOFOL N/A 10/12/2019   Procedure: COLONOSCOPY WITH PROPOFOL;  Surgeon: Earline Mayotte, MD;  Location: [facility];  Service:  Endoscopy;  Laterality: N/A;  DR BYRNETT WILL UPDATE H&P ON PROCEDURE DATE   DILATION AND CURETTAGE OF UTERUS     KNEE ARTHROPLASTY Left 09/07/2017   Procedure: COMPUTER ASSISTED TOTAL KNEE ARTHROPLASTY;  Surgeon: Donato Heinz, MD;  Location: [facility];  Service: Orthopedics;  Laterality: Left;   KNEE ARTHROSCOPY Bilateral    KNEE ARTHROSCOPY Right 06/24/2022   Procedure: RIGHT KNEE ARTHROSCOPY;  Surgeon: Marcene Corning, MD;  Location: [facility];  Service: Orthopedics;  Laterality: Right;   KNEE SURGERY Left 2009, 2010, 2012   LAPAROSCOPIC GASTRIC RESTRICTIVE DUODENAL PROCEDURE (DUODENAL SWITCH) N/A 04/17/2015   Procedure: LAPAROSCOPIC GASTRIC RESTRICTIVE DUODENAL PROCEDURE, single anastamosis ;  Surgeon: Geoffry Paradise, MD;  Location: [facility] OR[facility];  Service: General;  Laterality: N/A;   MASTECTOMY Left 1990   BREAST CA   TONSILLECTOMY     UPPER GI ENDOSCOPY N/A 04/17/2015   Procedure: UPPER GI ENDOSCOPY;  Surgeon: Geoffry Paradise, MD;  Location: [facility];  Service: General;  Laterality: N/A;    Social History   Socioeconomic History   Marital status: Widowed    Spouse name: Not on file   Number of children: Not on file   Years of education: Not on file   Highest education level: Not on file  Occupational History   Not  on file  Tobacco Use   Smoking status: Never   Smokeless tobacco: Never  Vaping Use   Vaping status: Never Used  Substance and Sexual Activity   Alcohol use: No   Drug use: No   Sexual activity: Not on file  Other Topics Concern   Not on file  Social History Narrative   Not on file   Social Determinants of Health   Financial Resource Strain: Not on file  Food Insecurity: Not on file  Transportation Needs: Not on file  Physical Activity: Not on file  Stress: Not on file  Social Connections: Not on file  Intimate Partner Violence: Not on file    Family History  Problem Relation Age of Onset   Heart failure Mother    Hypertension Mother    Hyperlipidemia Mother     Kidney disease Mother    CVA Mother    Hypothyroidism Mother    Breast cancer Maternal Aunt 3    Allergies  Allergen Reactions   Tdap [Tetanus-Diphth-Acell Pertussis] Shortness Of Breath    Outpatient Medications Prior to Visit  Medication Sig   albuterol (VENTOLIN HFA) 108 (90 Base) MCG/ACT inhaler INHALE 2 PUFFS INTO THE LUNGS FOUR TIMES A DAY   carvedilol (COREG) 3.125 MG tablet Take 1 tablet (3.125 mg total) by mouth 2 (two) times daily.   furosemide (LASIX) 20 MG tablet Take 1 tablet (20 mg total) by mouth daily as needed.   Multiple Vitamin (MULTIVITAMIN WITH MINERALS) TABS tablet Take 2 tablets by mouth daily.   sacubitril-valsartan (ENTRESTO) 97-103 MG Take 1 tablet by mouth 2 (two) times daily.   [DISCONTINUED] Cholecalciferol (VITAMIN D3) 1.25 MG (50000 UT) CAPS Take 50,000 Units by mouth once a week.   meloxicam (MOBIC) 15 MG tablet Take 1 tablet (15 mg total) by mouth daily. (Patient not taking: Reported on 10/30/2023)   [DISCONTINUED] Carbomer Gel Base (HYDROGEL) GEL 1 drop by Does not apply route daily. (Patient not taking: Reported on 06/30/2023)   [DISCONTINUED] tirzepatide (ZEPBOUND) 15 MG/0.5ML Pen Inject 15 mg into the skin once a week. (Patient not taking: Reported on 10/30/2023)   No facility-administered medications prior to visit.    Review of Systems  Constitutional: Negative.  Negative for chills, diaphoresis, fever, malaise/fatigue and weight loss.  HENT: Negative.  Negative for ear discharge, ear pain, hearing loss and tinnitus.   Eyes: Negative.   Respiratory: Negative.  Negative for cough and shortness of breath.   Cardiovascular: Negative.  Negative for chest pain, palpitations and leg swelling.  Gastrointestinal: Negative.  Negative for abdominal pain, constipation, diarrhea, heartburn, nausea and vomiting.  Genitourinary: Negative.  Negative for dysuria and flank pain.  Musculoskeletal: Negative.  Negative for falls, joint pain and myalgias.  Skin:  Negative.   Neurological: Negative.  Negative for dizziness, tingling, tremors, speech change, focal weakness, weakness and headaches.  Endo/Heme/Allergies: Negative.   Psychiatric/Behavioral: Negative.  Negative for depression and suicidal ideas. The patient is not nervous/anxious.        Objective:   BP (!) 130/90   Pulse 79   Ht 5' 6.5" (1.689 m)   Wt 226 lb 3.2 oz (102.6 kg)   LMP  (LMP Unknown)   SpO2 97%   BMI 35.96 kg/m   Vitals:   10/30/23 1033  BP: (!) 130/90  Pulse: 79  Height: 5' 6.5" (1.689 m)  Weight: 226 lb 3.2 oz (102.6 kg)  SpO2: 97%  BMI (Calculated): 35.97    Physical Exam   No  results found for any visits on 10/30/23.  Recent Results (from the past 2160 hour(s))  VITAMIN D 25 Hydroxy (Vit-D Deficiency, Fractures)     Status: Abnormal   Collection Time: 10/29/23 12:05 PM  Result Value Ref Range   Vit D, 25-Hydroxy 17.17 (L) 30 - 100 ng/mL    Comment: (NOTE) Vitamin D deficiency has been defined by the Institute of Medicine  and an Endocrine Society practice guideline as a level of serum 25-OH  vitamin D less than 20 ng/mL (1,2). The Endocrine Society went on to  further define vitamin D insufficiency as a level between 21 and 29  ng/mL (2).  1. IOM (Institute of Medicine). 2010. Dietary reference intakes for  calcium and D. [place]: [employer]. 2. Holick MF, Binkley Crosby, Bischoff-Ferrari HA, et al. Evaluation,  treatment, and prevention of vitamin D deficiency: an Endocrine  Society clinical practice guideline, JCEM. 2011 Jul; 96(7): 1911-30.  Performed at [facility], [address], [place], [place] [address]   PTH, intact and calcium     Status: Abnormal   Collection Time: 10/29/23 12:05 PM  Result Value Ref Range   PTH 69 (H) 15 - 65 pg/mL   Calcium, Total (PTH) 8.9 8.7 - 10.3 mg/dL   PTH Interp Comment     Comment: (NOTE) Interpretation                 Intact PTH    Calcium                                 (pg/mL)      (mg/dL) Normal                          15 - 65     8.6 - 10.2 Primary Hyperparathyroidism         >65          >10.2 Secondary Hyperparathyroidism       >65          <10.2 Non-Parathyroid Hypercalcemia       <65          >10.2 Hypoparathyroidism                  <15          < 8.6 Non-Parathyroid Hypocalcemia    15 - 65          < 8.6 Performed At: [facility] [address] [place], [place] [id number] Jolene Schimke MD EX:[id number]   Magnesium     Status: None   Collection Time: 10/29/23 12:05 PM  Result Value Ref Range   Magnesium 2.4 1.7 - 2.4 mg/dL    Comment: Performed at [facility], [address] [place], [place] [address]  Calcitriol (1,25 di-OH Vit D)     Status: Abnormal   Collection Time: 10/29/23 12:05 PM  Result Value Ref Range   Vit D, 1,25-Dihydroxy 99.6 (H) 24.8 - 81.5 pg/mL    Comment: (NOTE) Performed At: [facility] [address] [place], [place] [id number] Jolene Schimke MD [id number]   CBC with Differential/Platelet     Status: Abnormal   Collection Time: 10/29/23 12:05 PM  Result Value Ref Range   WBC 3.0 (L) 4.0 - 10.5 K/uL   RBC 4.53 3.87 - 5.11 MIL/uL   Hemoglobin 13.2 12.0 - 15.0 g/dL  HCT 41.0 36.0 - 46.0 %   MCV 90.5 80.0 - 100.0 fL   MCH 29.1 26.0 - 34.0 pg   MCHC 32.2 30.0 - 36.0 g/dL   RDW 16.1 09.6 - 04.5 %   Platelets 136 (L) 150 - 400 K/uL   nRBC 0.0 0.0 - 0.2 %   Neutrophils Relative % 50 %   Neutro Abs 1.5 (L) 1.7 - 7.7 K/uL   Lymphocytes Relative 40 %   Lymphs Abs 1.2 0.7 - 4.0 K/uL   Monocytes Relative 7 %   Monocytes Absolute 0.2 0.1 - 1.0 K/uL   Eosinophils Relative 3 %   Eosinophils Absolute 0.1 0.0 - 0.5 K/uL   Basophils Relative 0 %   Basophils Absolute 0.0 0.0 - 0.1 K/uL   Immature Granulocytes 0 %   Abs Immature Granulocytes 0.01 0.00 - 0.07 K/uL    Comment: Performed at [facility], [address] [place], [place] [address]  Hemoglobin A1c     Status: None    Collection Time: 10/29/23 12:05 PM  Result Value Ref Range   Hgb A1c MFr Bld 5.1 4.8 - 5.6 %    Comment: (NOTE) Pre diabetes:          5.7%-6.4%  Diabetes:              >6.4%  Glycemic control for   <7.0% adults with diabetes    Mean Plasma Glucose 99.67 mg/dL    Comment: Performed at [facility], [address], [place], [place] [address]  Lipid panel     Status: None   Collection Time: 10/29/23 12:05 PM  Result Value Ref Range   Cholesterol 144 0 - 200 mg/dL   Triglycerides 36 <829 mg/dL   HDL 58 >56 mg/dL   Total CHOL/HDL Ratio 2.5 RATIO   VLDL 7 0 - 40 mg/dL   LDL Cholesterol 79 0 - 99 mg/dL    Comment:        Total Cholesterol/HDL:CHD Risk Coronary Heart Disease Risk Table                     Men   Women  1/2 Average Risk   3.4   3.3  Average Risk       5.0   4.4  2 X Average Risk   9.6   7.1  3 X Average Risk  23.4   11.0        Use the calculated Patient Ratio above and the CHD Risk Table to determine the patient's CHD Risk.        ATP III CLASSIFICATION (LDL):  <100     mg/dL   Optimal  213-086  mg/dL   Near or Above                    Optimal  130-159  mg/dL   Borderline  578-469  mg/dL   High  >629     mg/dL   Very High Performed at [facility], [address] [place], [place] [address]   Comprehensive metabolic panel     Status: None   Collection Time: 10/29/23 12:05 PM  Result Value Ref Range   Sodium 140 135 - 145 mmol/L   Potassium 4.2 3.5 - 5.1 mmol/L   Chloride 107 98 - 111 mmol/L   CO2 25 22 - 32 mmol/L   Glucose, Bld 80 70 - 99 mg/dL    Comment: Glucose reference range applies  only to samples taken after fasting for at least 8 hours.   BUN 13 8 - 23 mg/dL   Creatinine, Ser 8.65 0.44 - 1.00 mg/dL   Calcium 8.9 8.9 - 78.4 mg/dL   Total Protein 7.3 6.5 - 8.1 g/dL   Albumin 4.0 3.5 - 5.0 g/dL   AST 24 15 - 41 U/L   ALT 26 0 - 44 U/L   Alkaline Phosphatase 126 38 - 126 U/L   Total Bilirubin 0.6 <1.2 mg/dL   GFR,  Estimated >69 >62 mL/min    Comment: (NOTE) Calculated using the CKD-EPI Creatinine Equation (2021)    Anion gap 8 5 - 15    Comment: Performed at [facility], [address], [place], [place] [address]  Renal function panel     Status: None   Collection Time: 10/29/23 12:06 PM  Result Value Ref Range   Sodium 140 135 - 145 mmol/L   Potassium 4.3 3.5 - 5.1 mmol/L   Chloride 107 98 - 111 mmol/L   CO2 26 22 - 32 mmol/L   Glucose, Bld 79 70 - 99 mg/dL    Comment: Glucose reference range applies only to samples taken after fasting for at least 8 hours.   BUN 14 8 - 23 mg/dL   Creatinine, Ser 1.32 0.44 - 1.00 mg/dL   Calcium 8.9 8.9 - 44.0 mg/dL   Phosphorus 3.5 2.5 - 4.6 mg/dL   Albumin 3.8 3.5 - 5.0 g/dL   GFR, Estimated >10 >27 mL/min    Comment: (NOTE) Calculated using the CKD-EPI Creatinine Equation (2021)    Anion gap 7 5 - 15    Comment: Performed at [facility], [address], [place], [place] [address]      Assessment & Plan:  Patient to continue all her medications for now.. Monitor blood pressure.. Follow-up with her cardiologist. Prescription sent for Contrave. Problem List Items Addressed This Visit     Essential hypertension, benign - Primary   Atrial fibrillation (HCC)   Vitamin D deficiency   Relevant Medications   Cholecalciferol (VITAMIN D3) 1.25 MG (50000 UT) CAPS   Class 2 obesity due to excess calories without serious comorbidity with body mass index (BMI) of 35.0 to 35.9 in adult   Relevant Medications   Naltrexone-buPROPion HCl ER 8-90 MG TB12   Prediabetes   Mixed hyperlipidemia   Other Visit Diagnoses     Vitamin B12 deficiency       Relevant Orders   Vitamin B12       Return in about 6 weeks (around 12/11/2023).   Total time spent: 30 minutes  Margaretann Loveless, MD  10/30/2023   This document may have been prepared by [facility] Voice Recognition software and as such may include unintentional dictation errors.

## 2023-11-02 ENCOUNTER — Other Ambulatory Visit: Payer: Self-pay

## 2023-11-04 ENCOUNTER — Encounter: Payer: Self-pay | Admitting: "Endocrinology

## 2023-11-04 ENCOUNTER — Ambulatory Visit: Payer: Self-pay | Admitting: "Endocrinology

## 2023-11-04 VITALS — BP 130/84 | HR 84 | Ht 66.5 in | Wt 224.6 lb

## 2023-11-04 DIAGNOSIS — M810 Age-related osteoporosis without current pathological fracture: Secondary | ICD-10-CM | POA: Diagnosis not present

## 2023-11-04 DIAGNOSIS — E213 Hyperparathyroidism, unspecified: Secondary | ICD-10-CM | POA: Diagnosis not present

## 2023-11-04 DIAGNOSIS — E559 Vitamin D deficiency, unspecified: Secondary | ICD-10-CM | POA: Diagnosis not present

## 2023-11-04 NOTE — Patient Instructions (Addendum)
 Calcium citrate 500 mg twice a day

## 2023-11-04 NOTE — Progress Notes (Signed)
[facility] Note Mandy Springer, MD    Referring Provider: Margaretann Loveless, MD Primary Care Provider: Margaretann Loveless, MD No chief complaint on file.   Assessment & Plan  Diagnoses and all orders for this visit:  Age-related osteoporosis without current pathological fracture -     Renal function panel -     VITAMIN D 25 Hydroxy (Vit-D Deficiency, Fractures) -     Vitamin D 1,25 dihydroxy  Vitamin D deficiency  Hyperparathyroidism (HCC)    Osteoporosis Likely secondary cause from age.  BMD results suggest: T-score -3.5 L-spine. History of duodenal switch. Recommend to use calcium citrate 500 mg twice daily OTC and vitamin D 50,000 units Vit D weekly.  Discussed weight bearing exercise options and dietary supplements.  Not a candidate of forteo due to history of recurrent breast cancer Educated on risks and side effects of reclast including atypical femoral fractures and osteonecrosis of the jaw. Patient understands and would like advance with treatment post Vit D improvement  Advised fall precautions, adequate dairy in diet and exercises (aerobic, balancing and weight bearing) as tolerated.   Vit D deficiency Low 25 Vit D with high 1,25 Vit D Start 50,000 units Vit D weekly Repeat albs in 6 weeks   Hyperparathyroidism with mildly elevated PTH at 69 and elevated 1,25 D at 99.6 Phosphorus is normal and calcium is low normal Will continue to follow up post 25 Vit D correction   Return in about 6 weeks (around 12/16/2023) for visit + labs before next visit.  I have reviewed current medications, nurse's notes, allergies, vital signs, past medical and surgical history, family medical history, and social history for this encounter. Counseled patient on symptoms, examination findings, lab findings, imaging results, treatment decisions and monitoring and prognosis. The patient understood the recommendations and agrees with the treatment plan. All questions regarding  treatment plan were fully answered.   Mandy Castlewood, MD   11/04/23    History of Present Illness Mandy Roberts is a 62 y.o. year old female who presents to our clinic with osteoporosis diagnosed in 2021. BMD completed on 07/2023 suggested osteoporosis. She is currently not taking Calcium and vitamin D OTC (unknown) international units once daily.  Has chemotherapy in 1990 and radiation in 1997 for breast cancer.  Couldn't tolerate fosamax due to "body pain".  On OTC Vit D, just pres  Has duodenal switch in 2016.    Risk Factors screening:  History of low trauma osteoporotic fractures: No Family history of osteoporosis: Yes Hip fracture in first-degree relatives: No Smoking history: No Excessive alcohol intake >2 drinks/day: No Excessive caffeine intake >2 drinks/day: Yes Glucocorticoid use >5mg  prednisone/day for >3 months: No Rheumatoid arthritis history: No Premature/Surgical Menopause: No  Physical Exam  BP 130/84   Pulse 84   Ht 5' 6.5" (1.689 m)   Wt 224 lb 9.6 oz (101.9 kg)   LMP  (LMP Unknown)   SpO2 97%   BMI 35.71 kg/m  Constitutional: well developed, well nourished Head: normocephalic, atraumatic Eyes: sclera anicteric, no redness Neck: supple Lungs: normal respiratory effort Neurology: alert and oriented Skin: dry, no appreciable rashes Musculoskeletal: no appreciable defects Psychiatric: normal mood and affect  Allergies Allergies  Allergen Reactions   Tdap [Tetanus-Diphth-Acell Pertussis] Shortness Of Breath    Current Medications Patient's Medications  New Prescriptions   No medications on file  Previous Medications   ALBUTEROL (VENTOLIN HFA) 108 (90 BASE) MCG/ACT INHALER    INHALE 2 PUFFS INTO  THE LUNGS FOUR TIMES A DAY   CARVEDILOL (COREG) 3.125 MG TABLET    Take 1 tablet (3.125 mg total) by mouth 2 (two) times daily.   CHOLECALCIFEROL (VITAMIN D3) 1.25 MG (50000 UT) CAPS    Take 1 capsule (50,000 Units total) by mouth once a week.    FUROSEMIDE (LASIX) 20 MG TABLET    Take 1 tablet (20 mg total) by mouth daily as needed.   MELOXICAM (MOBIC) 15 MG TABLET    Take 1 tablet (15 mg total) by mouth daily.   MULTIPLE VITAMIN (MULTIVITAMIN WITH MINERALS) TABS TABLET    Take 2 tablets by mouth daily.   NALTREXONE-BUPROPION HCL ER 8-90 MG TB12    Start 1 tablet every morning for 7 days, then 1 tablet twice daily for 7 days, then 2 tablets every morning and one in the evening, then 2 tablets twice a day if tolerated.   SACUBITRIL-VALSARTAN (ENTRESTO) 97-103 MG    Take 1 tablet by mouth 2 (two) times daily.  Modified Medications   No medications on file  Discontinued Medications   No medications on file     Past Medical History Past Medical History:  Diagnosis Date   Atrial fibrillation (HCC)    Breast cancer (HCC) 1990/1997    left mastectomy ,had recurrence in left suprclavic nodes several yrs ago-treated with radiation and chemo   CHF (congestive heart failure) (HCC)    Colon polyp 2008   Complication of anesthesia    Difficult intubation    expectorated blood 3-4 days after surgery in 2011   Difficult intubation    hypoxia with endoscopy in 2016   GERD (gastroesophageal reflux disease)    History of chemotherapy    5-FU/AC in 1990 under the care of Dr. Doylene Canning. pt also tx with tamoxifen and arimidex   History of radiation therapy    radiation treat at [facility]   Hypertension 2008   Personal history of chemotherapy 1990   BREAST CA   Personal history of malignant neoplasm of breast 1990    left mastectomy    Personal history of radiation therapy 1997   BREAST CA   Sinusitis    Sleep apnea    history of, went away after gastric surgery    Past Surgical History Past Surgical History:  Procedure Laterality Date   abdomenoplasty     ABDOMINAL HYSTERECTOMY  1996   COLONOSCOPY  2008, 2015   Dr. Evette Cristal   COLONOSCOPY WITH PROPOFOL N/A 10/12/2019   Procedure: COLONOSCOPY WITH PROPOFOL;  Surgeon: Earline Mayotte, MD;   Location: [facility];  Service: Endoscopy;  Laterality: N/A;  DR BYRNETT WILL UPDATE H&P ON PROCEDURE DATE   DILATION AND CURETTAGE OF UTERUS     KNEE ARTHROPLASTY Left 09/07/2017   Procedure: COMPUTER ASSISTED TOTAL KNEE ARTHROPLASTY;  Surgeon: Donato Heinz, MD;  Location: [facility];  Service: Orthopedics;  Laterality: Left;   KNEE ARTHROSCOPY Bilateral    KNEE ARTHROSCOPY Right 06/24/2022   Procedure: RIGHT KNEE ARTHROSCOPY;  Surgeon: Marcene Corning, MD;  Location: [facility];  Service: Orthopedics;  Laterality: Right;   KNEE SURGERY Left 2009, 2010, 2012   LAPAROSCOPIC GASTRIC RESTRICTIVE DUODENAL PROCEDURE (DUODENAL SWITCH) N/A 04/17/2015   Procedure: LAPAROSCOPIC GASTRIC RESTRICTIVE DUODENAL PROCEDURE, single anastamosis ;  Surgeon: Geoffry Paradise, MD;  Location: [facility];  Service: General;  Laterality: N/A;   MASTECTOMY Left 1990   BREAST CA   TONSILLECTOMY     UPPER GI ENDOSCOPY N/A 04/17/2015  Procedure: UPPER GI ENDOSCOPY;  Surgeon: Geoffry Paradise, MD;  Location: [facility];  Service: General;  Laterality: N/A;    Family History family history includes Breast cancer (age of onset: 67) in her maternal aunt; CVA in her mother; Heart failure in her mother; Hyperlipidemia in her mother; Hypertension in her mother; Hypothyroidism in her mother; Kidney disease in her mother.  Social History Social History   Socioeconomic History   Marital status: Widowed    Spouse name: Not on file   Number of children: Not on file   Years of education: Not on file   Highest education level: Not on file  Occupational History   Not on file  Tobacco Use   Smoking status: Never   Smokeless tobacco: Never  Vaping Use   Vaping status: Never Used  Substance and Sexual Activity   Alcohol use: No   Drug use: No   Sexual activity: Not on file  Other Topics Concern   Not on file  Social History Narrative   Not on file   Social Determinants of Health   Financial Resource Strain: Not on file  Food  Insecurity: Not on file  Transportation Needs: Not on file  Physical Activity: Not on file  Stress: Not on file  Social Connections: Not on file  Intimate Partner Violence: Not on file    Laboratory Investigations No components found for: "CMP" No components found for: "BMP" No results found for: "GFR" Lab Results  Component Value Date   CREATININE 0.68 10/29/2023   No results found for: "CBC" No components found for: "LFT" No components found for: "VITD" Lab Results  Component Value Date   PTH 69 (H) 10/29/2023   PTH Comment 10/29/2023   Lab Results  Component Value Date   TSH 1.560 06/30/2023   No components found for: "RENAL FUNCTION" No components found for: "MAGNESIUM"  Parts of this note may have been dictated using voice recognition software. There may be variances in spelling and vocabulary which are unintentional. Not all errors are proofread. Please notify the Thereasa Parkin if any discrepancies are noted or if the meaning of any statement is not clear.

## 2023-11-08 ENCOUNTER — Other Ambulatory Visit: Payer: Self-pay

## 2023-11-08 ENCOUNTER — Emergency Department: Payer: 59

## 2023-11-08 ENCOUNTER — Emergency Department
Admission: EM | Admit: 2023-11-08 | Discharge: 2023-11-08 | Disposition: A | Discharge status: Home / Self Care | Payer: 59 | Attending: Emergency Medicine | Admitting: Emergency Medicine

## 2023-11-08 DIAGNOSIS — I480 Paroxysmal atrial fibrillation: Secondary | ICD-10-CM | POA: Insufficient documentation

## 2023-11-08 DIAGNOSIS — R079 Chest pain, unspecified: Secondary | ICD-10-CM | POA: Diagnosis not present

## 2023-11-08 DIAGNOSIS — I700 Atherosclerosis of aorta: Secondary | ICD-10-CM | POA: Diagnosis not present

## 2023-11-08 DIAGNOSIS — R002 Palpitations: Secondary | ICD-10-CM | POA: Diagnosis not present

## 2023-11-08 DIAGNOSIS — I4891 Unspecified atrial fibrillation: Secondary | ICD-10-CM | POA: Diagnosis not present

## 2023-11-08 LAB — CBC
HCT: 44.1 % (ref 36.0–46.0)
Hemoglobin: 14.2 g/dL (ref 12.0–15.0)
MCH: 29 pg (ref 26.0–34.0)
MCHC: 32.2 g/dL (ref 30.0–36.0)
MCV: 90 fL (ref 80.0–100.0)
Platelets: 136 10*3/uL — ABNORMAL LOW (ref 150–400)
RBC: 4.9 MIL/uL (ref 3.87–5.11)
RDW: 12.5 % (ref 11.5–15.5)
WBC: 2.6 10*3/uL — ABNORMAL LOW (ref 4.0–10.5)
nRBC: 0 % (ref 0.0–0.2)

## 2023-11-08 LAB — BASIC METABOLIC PANEL
Anion gap: 7 (ref 5–15)
BUN: 19 mg/dL (ref 8–23)
CO2: 26 mmol/L (ref 22–32)
Calcium: 9.3 mg/dL (ref 8.9–10.3)
Chloride: 105 mmol/L (ref 98–111)
Creatinine, Ser: 0.76 mg/dL (ref 0.44–1.00)
GFR, Estimated: >60 mL/min (ref >60)
Glucose, Bld: 92 mg/dL (ref 70–99)
Potassium: 4.1 mmol/L (ref 3.5–5.1)
Sodium: 138 mmol/L (ref 135–145)

## 2023-11-08 LAB — TROPONIN I (HIGH SENSITIVITY): Troponin I (High Sensitivity): 5 ng/L (ref <18)

## 2023-11-08 NOTE — ED Notes (Signed)
Patient ambulated from the room with a steady gait.

## 2023-11-08 NOTE — ED Provider Notes (Signed)
[facility] Provider Note    Event Date/Time   First MD Initiated Contact with Patient 11/08/23 1059     (approximate)   History   Palpitations   HPI  Mandy Roberts is a 62 y.o. female with a history of atrial fibrillation, paroxysmal, not on blood thinners who presents with complaints of palpitations that occurred around 530 this morning, she did feel mild chest discomfort at the time, resolved shortly thereafter.  Reports compliance with her medications.  No fevers or chills.  No shortness of breath.  No lower extremity swelling.  She sees Dr. Gala Romney of cardiology.  Currently feels well has no complaints     Physical Exam   Triage Vital Signs: ED Triage Vitals  Encounter Vitals Group     BP 11/08/23 1003 128/65     Systolic BP Percentile --      Diastolic BP Percentile --      Pulse Rate 11/08/23 1003 81     Resp 11/08/23 1003 16     Temp 11/08/23 1002 98.5 F (36.9 C)     Temp Source 11/08/23 1002 Oral     SpO2 11/08/23 1003 97 %     Weight 11/08/23 1003 101.9 kg (224 lb 9.6 oz)     Height --      Head Circumference --      Peak Flow --      Pain Score 11/08/23 1003 0     Pain Loc --      Pain Education --      Exclude from Growth Chart --     Most recent vital signs: Vitals:   11/08/23 1115 11/08/23 1130  BP: (!) 147/70 125/72  Pulse: 73 71  Resp: 16 14  Temp:    SpO2: 98% 97%     General: Awake, no distress.  CV:  Good peripheral perfusion.  Regular rate and rhythm Resp:  Normal effort.  Abd:  No distention.  Other:  No lower extremity edema   ED Results / Procedures / Treatments   Labs (all labs ordered are listed, but only abnormal results are displayed) Labs Reviewed  CBC - Abnormal; Notable for the following components:      Result Value   WBC 2.6 (*)    Platelets 136 (*)    All other components within normal limits  BASIC METABOLIC PANEL  TROPONIN I (HIGH SENSITIVITY)     EKG  ED ECG REPORT I,  Jene Every, the attending physician, personally viewed and interpreted this ECG.  Date: 11/08/2023  Rhythm: normal sinus rhythm QRS Axis: normal Intervals: normal ST/T Wave abnormalities: Nonspecific changes Narrative Interpretation: no evidence of acute ischemia    RADIOLOGY X-ray viewed interpret by me, no acute abnormality    PROCEDURES:  Critical Care performed:   Procedures   MEDICATIONS ORDERED IN ED: Medications - No data to display   IMPRESSION / MDM / ASSESSMENT AND PLAN / ED COURSE  I reviewed the triage vital signs and the nursing notes. Patient's presentation is most consistent with severe exacerbation of chronic illness.  Patient presents with reports of palpitations in the setting of a history of atrial fibrillation.  This occurred around 5:30 AM this morning, lasted for few minutes, has been feeling well since then.  But given her chest discomfort wanted to be evaluated.  EKG is reassuring, high sensitive troponin is normal, not consistent with ACS.  Chest x-ray is benign.  No lower extremity swelling.  Will  place in the cardiac monitor and observe here in the emergency department for.  Given that she remains asymptomatic.  Suspect paroxysmal atrial fibrillation, patient reports she will be able to see her cardiologist this week.  No indication for admission at this time, appropriate for discharge with outpatient follow-up.  Return precautions discussed.        FINAL CLINICAL IMPRESSION(S) / ED DIAGNOSES   Final diagnoses:  Paroxysmal atrial fibrillation (HCC)     Rx / DC Orders   ED Discharge Orders     None        Note:  This document was prepared using Dragon voice recognition software and may include unintentional dictation errors.   Jene Every, MD 11/08/23 [phone]

## 2023-11-08 NOTE — ED Notes (Signed)
Patient c/o ingestion, but no pain.

## 2023-11-08 NOTE — ED Triage Notes (Signed)
C/O intermittent palpitations this morning.  C/O chest pressure radiating to back with palpitations. Denies SOB/  AAOx3.  Skin warm and dry. No SOB/ DOE. NAD

## 2023-11-10 ENCOUNTER — Other Ambulatory Visit: Payer: Self-pay

## 2023-11-11 ENCOUNTER — Encounter: Payer: Self-pay | Admitting: Internal Medicine

## 2023-11-11 ENCOUNTER — Other Ambulatory Visit: Payer: Self-pay

## 2023-11-12 DIAGNOSIS — C50912 Malignant neoplasm of unspecified site of left female breast: Secondary | ICD-10-CM | POA: Diagnosis not present

## 2023-11-23 ENCOUNTER — Ambulatory Visit
Admission: RE | Admit: 2023-11-23 | Discharge: 2023-11-23 | Disposition: A | Discharge status: Home / Self Care | Payer: 59 | Source: Ambulatory Visit | Attending: General Surgery | Admitting: General Surgery

## 2023-11-23 DIAGNOSIS — Z1231 Encounter for screening mammogram for malignant neoplasm of breast: Secondary | ICD-10-CM | POA: Insufficient documentation

## 2023-12-09 IMAGING — MG MM DIGITAL SCREENING UNILAT*R* W/ TOMO W/ CAD
4 series · 4 of 12 positions shown · non-contrast
Comparison: Previous exam(s).

CLINICAL DATA: Screening.

EXAM:
DIGITAL SCREENING UNILATERAL RIGHT MAMMOGRAM WITH CAD AND
TOMOSYNTHESIS
TECHNIQUE: Right screening digital craniocaudal and mediolateral oblique
mammograms were obtained. Right screening digital breast
tomosynthesis was performed. The images were evaluated with
computer-aided detection.

[R MLO synth-2D]
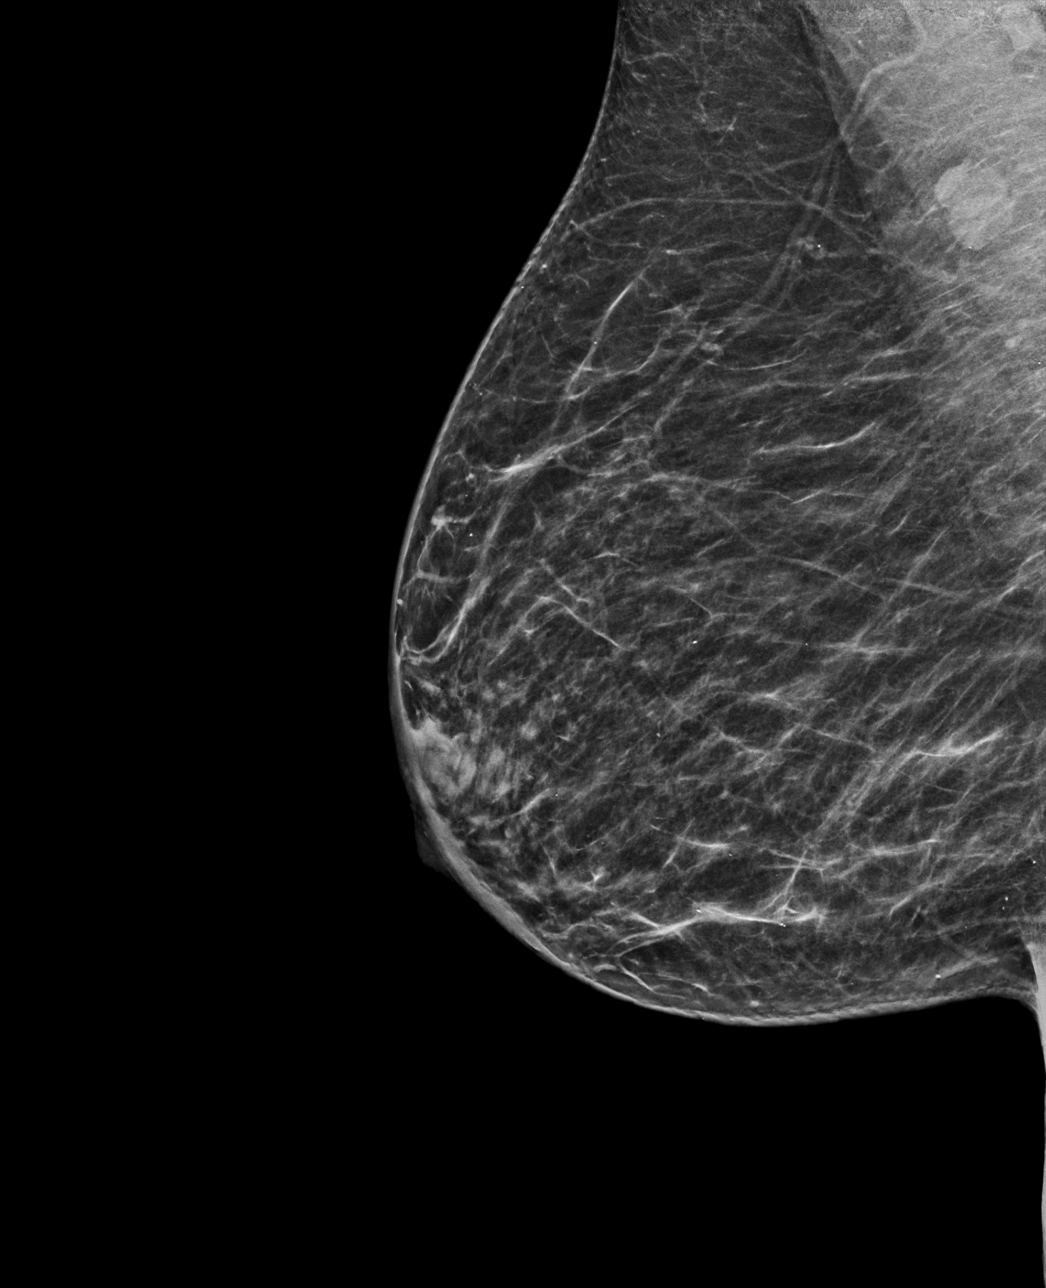

[R CC synth-2D]
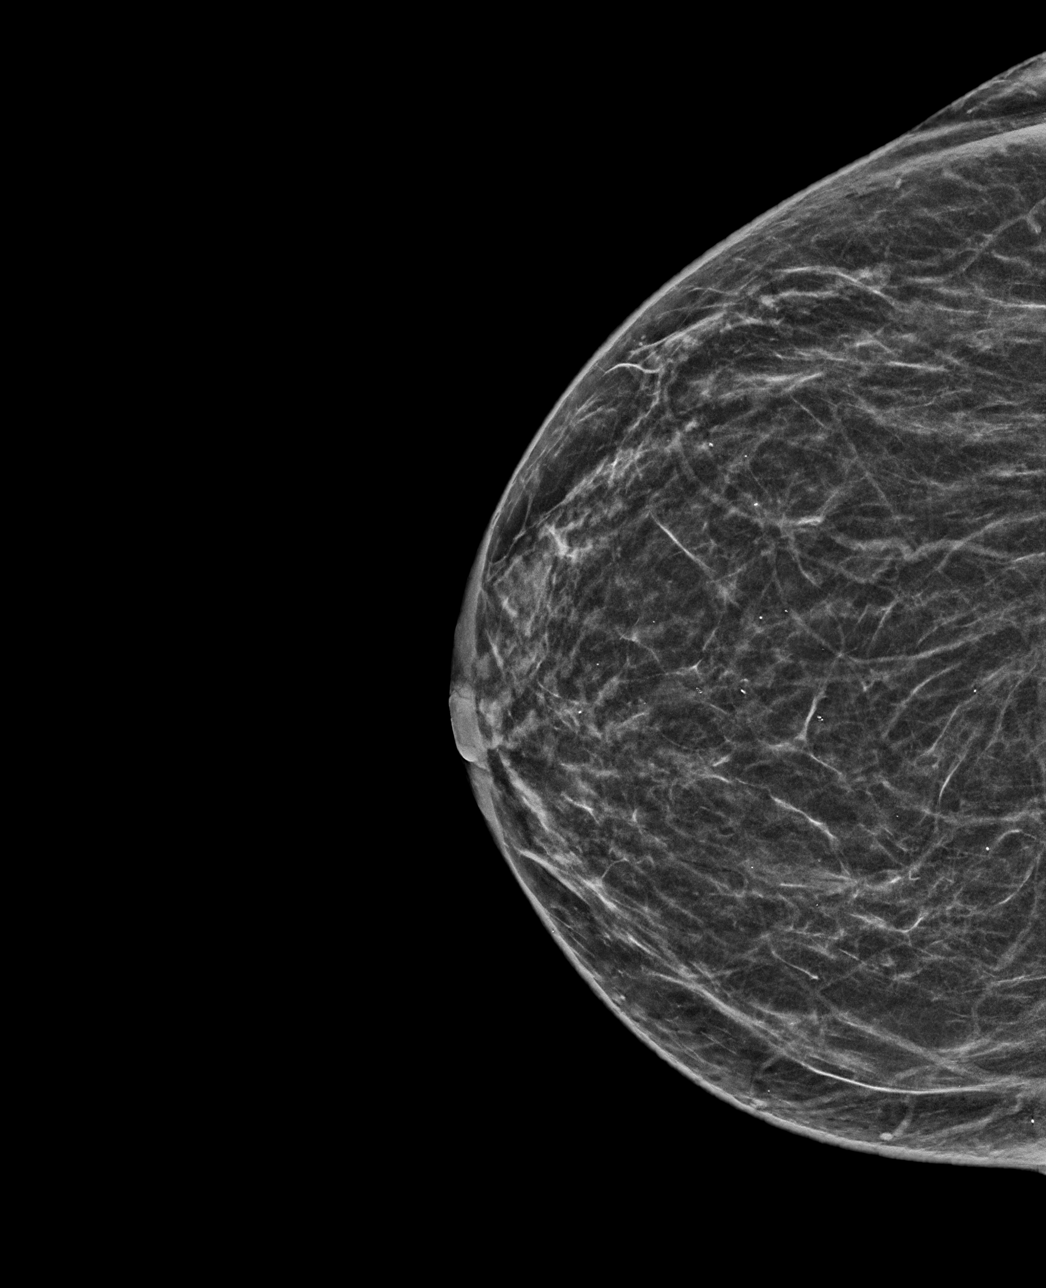

[R MLO tomo · tomo slice 29/57.0]
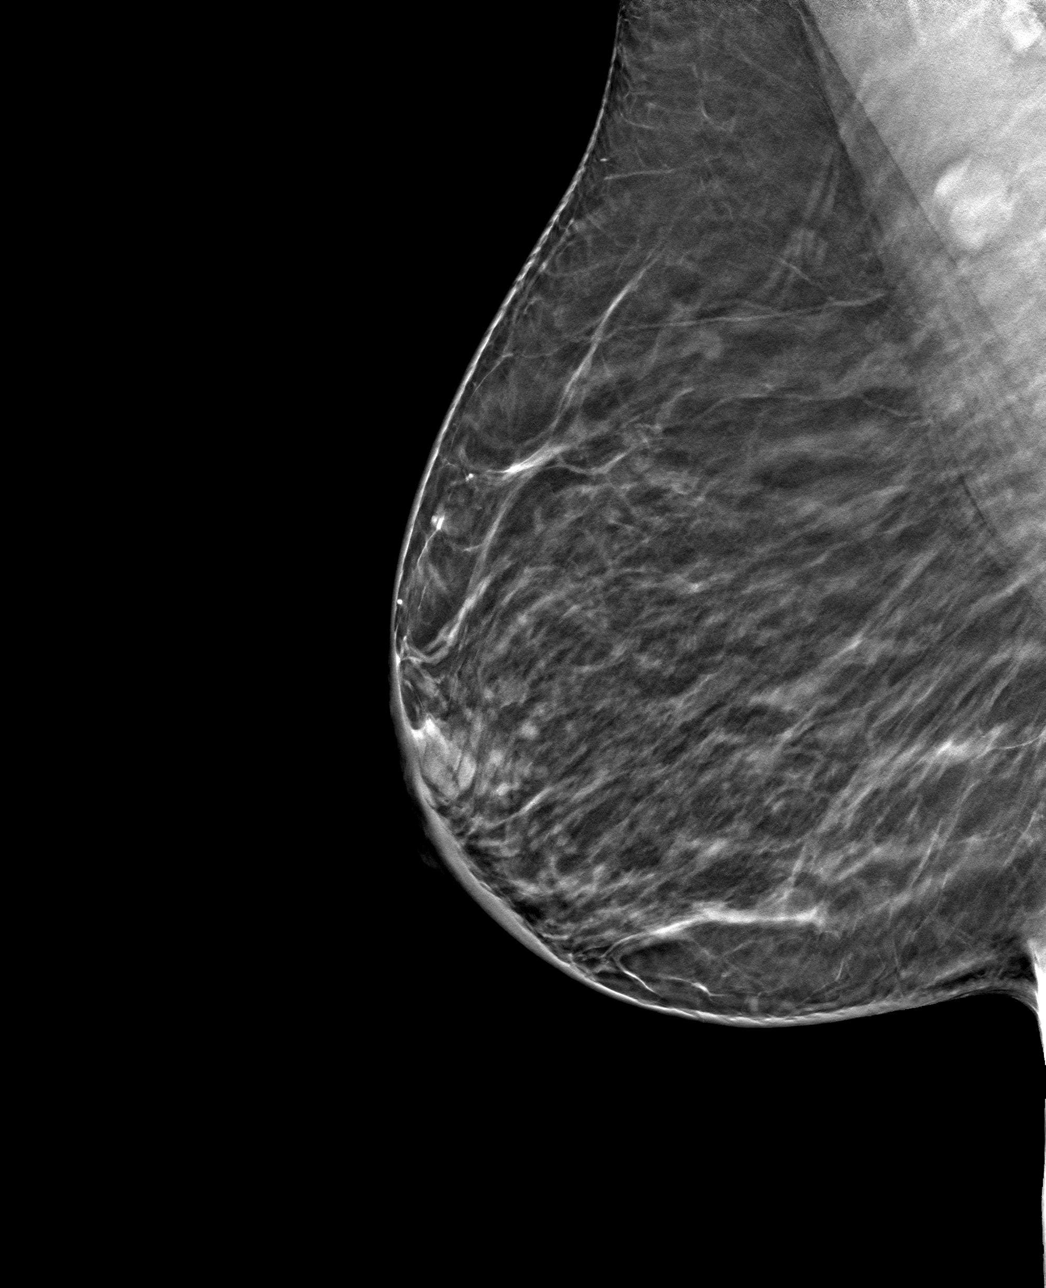

[R CC tomo · tomo slice 27/53.0]
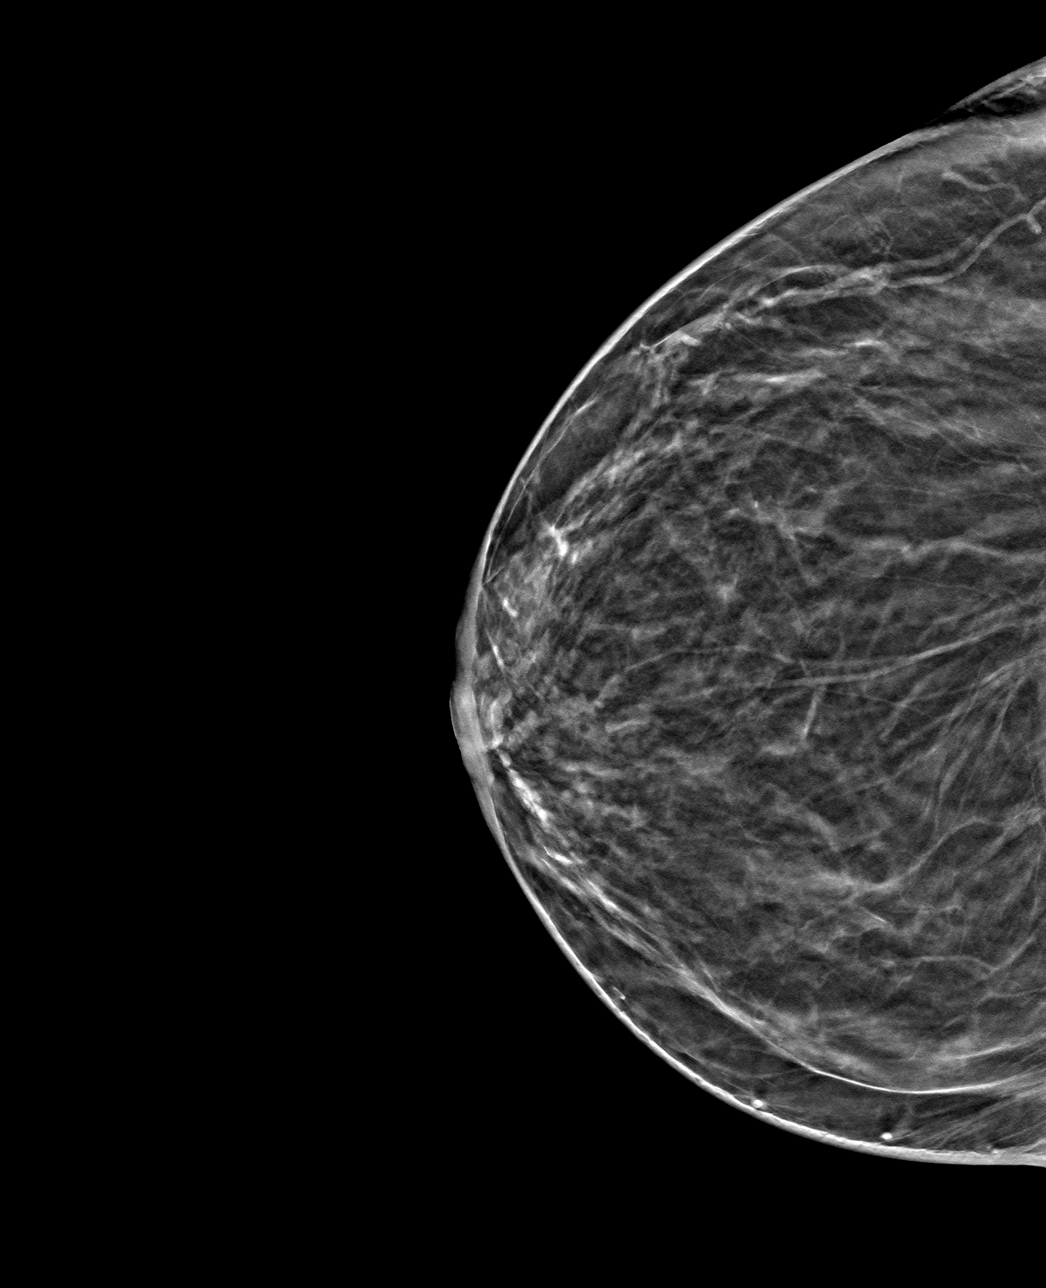

[4 of 12 positions shown; findings below may reference images not displayed]

ACR Breast Density Category b: There are scattered areas of
fibroglandular density.
FINDINGS: The patient has had a left mastectomy. There are no findings
suspicious for malignancy.
IMPRESSION: No mammographic evidence of malignancy. A result letter of this
screening mammogram will be mailed directly to the patient.

RECOMMENDATION:
Screening mammogram in one year.  (Code:NT-E-EGT)

BI-RADS CATEGORY  1: Negative.

## 2023-12-10 ENCOUNTER — Other Ambulatory Visit: Payer: Self-pay

## 2023-12-10 ENCOUNTER — Telehealth: Payer: Self-pay | Admitting: "Endocrinology

## 2023-12-10 DIAGNOSIS — M810 Age-related osteoporosis without current pathological fracture: Secondary | ICD-10-CM

## 2023-12-10 NOTE — Telephone Encounter (Signed)
Patient is calling to say that she is going to get her labs done at her PCP's office and she would like the lab orders sent to her.  Patient is requesting that orders be faxed to a secure fax line at [facility] at [phone].  Patient would like a telephone call after orders are faxed and she states that the orders need to sent today.

## 2023-12-11 ENCOUNTER — Ambulatory Visit: Payer: 59 | Admitting: Internal Medicine

## 2023-12-11 ENCOUNTER — Encounter: Payer: Self-pay | Admitting: Internal Medicine

## 2023-12-11 ENCOUNTER — Other Ambulatory Visit
Admission: RE | Admit: 2023-12-11 | Discharge: 2023-12-11 | Disposition: A | Discharge status: Home / Self Care | Payer: 59 | Source: Ambulatory Visit | Attending: "Endocrinology | Admitting: "Endocrinology

## 2023-12-11 VITALS — BP 118/76 | HR 69 | Ht 66.5 in | Wt 227.2 lb

## 2023-12-11 DIAGNOSIS — I10 Essential (primary) hypertension: Secondary | ICD-10-CM | POA: Diagnosis not present

## 2023-12-11 DIAGNOSIS — F411 Generalized anxiety disorder: Secondary | ICD-10-CM | POA: Diagnosis not present

## 2023-12-11 DIAGNOSIS — E66812 Obesity, class 2: Secondary | ICD-10-CM | POA: Diagnosis not present

## 2023-12-11 DIAGNOSIS — E559 Vitamin D deficiency, unspecified: Secondary | ICD-10-CM

## 2023-12-11 DIAGNOSIS — I480 Paroxysmal atrial fibrillation: Secondary | ICD-10-CM | POA: Diagnosis not present

## 2023-12-11 DIAGNOSIS — M810 Age-related osteoporosis without current pathological fracture: Secondary | ICD-10-CM | POA: Insufficient documentation

## 2023-12-11 DIAGNOSIS — E6609 Other obesity due to excess calories: Secondary | ICD-10-CM

## 2023-12-11 DIAGNOSIS — Z6835 Body mass index (BMI) 35.0-35.9, adult: Secondary | ICD-10-CM

## 2023-12-11 LAB — RENAL FUNCTION PANEL
Albumin: 3.6 g/dL (ref 3.5–5.0)
Anion gap: 7 (ref 5–15)
BUN: 18 mg/dL (ref 8–23)
CO2: 26 mmol/L (ref 22–32)
Calcium: 9.1 mg/dL (ref 8.9–10.3)
Chloride: 109 mmol/L (ref 98–111)
Creatinine, Ser: 0.84 mg/dL (ref 0.44–1.00)
GFR, Estimated: >60 mL/min (ref >60)
Glucose, Bld: 77 mg/dL (ref 70–99)
Phosphorus: 3.6 mg/dL (ref 2.5–4.6)
Potassium: 4.1 mmol/L (ref 3.5–5.1)
Sodium: 142 mmol/L (ref 135–145)

## 2023-12-11 NOTE — Progress Notes (Signed)
Established Patient Office Visit  Subjective:  Patient ID: Mandy Roberts, female    DOB: 10-10-1961  Age: 63 y.o. MRN: [id number]  Chief Complaint  Patient presents with   Follow-up    6 week follow up    Patient comes in for follow-up today.  She was given a prescription of Contrave to help lose weight, but she could not take more than a few doses as it caused a lot of GI upset.  Patient was going through a few things at that time and decided to stop it.  Will resume it now.  Recently had an episode of paroxysmal A-fib, went to emergency room where her EKG was normal and workup was negative. She has an appointment to see the cardiologist. No new complaints today.    No other concerns at this time.   Past Medical History:  Diagnosis Date   Atrial fibrillation (HCC)    Breast cancer [facility]) 1990/1997    left mastectomy ,had recurrence in left suprclavic nodes several yrs ago-treated with radiation and chemo   CHF (congestive heart failure) (HCC)    Colon polyp 2008   Complication of anesthesia    Difficult intubation    expectorated blood 3-4 days after surgery in 2011   Difficult intubation    hypoxia with endoscopy in 2016   GERD (gastroesophageal reflux disease)    History of chemotherapy    5-FU/AC in 1990 under the care of Dr. Doylene Canning. pt also tx with tamoxifen and arimidex   History of radiation therapy    radiation treat at [facility]   Hypertension 2008   Personal history of chemotherapy 1990   BREAST CA   Personal history of malignant neoplasm of breast 1990    left mastectomy    Personal history of radiation therapy 1997   BREAST CA   Sinusitis    Sleep apnea    history of, went away after gastric surgery    Past Surgical History:  Procedure Laterality Date   abdomenoplasty     ABDOMINAL HYSTERECTOMY  1996   COLONOSCOPY  2008, 2015   Dr. Evette Cristal   COLONOSCOPY WITH PROPOFOL N/A 10/12/2019   Procedure: COLONOSCOPY WITH PROPOFOL;  Surgeon: Earline Mayotte,  MD;  Location: [facility] ENDOSCOPY;  Service: Endoscopy;  Laterality: N/A;  DR BYRNETT WILL UPDATE H&P ON PROCEDURE DATE   DILATION AND CURETTAGE OF UTERUS     KNEE ARTHROPLASTY Left 09/07/2017   Procedure: COMPUTER ASSISTED TOTAL KNEE ARTHROPLASTY;  Surgeon: Donato Heinz, MD;  Location: [facility] ORS;  Service: Orthopedics;  Laterality: Left;   KNEE ARTHROSCOPY Bilateral    KNEE ARTHROSCOPY Right 06/24/2022   Procedure: RIGHT KNEE ARTHROSCOPY;  Surgeon: Marcene Corning, MD;  Location: [facility] ORS;  Service: Orthopedics;  Laterality: Right;   KNEE SURGERY Left 2009, 2010, 2012   LAPAROSCOPIC GASTRIC RESTRICTIVE DUODENAL PROCEDURE (DUODENAL SWITCH) N/A 04/17/2015   Procedure: LAPAROSCOPIC GASTRIC RESTRICTIVE DUODENAL PROCEDURE, single anastamosis ;  Surgeon: Geoffry Paradise, MD;  Location: [facility] ORS;  Service: General;  Laterality: N/A;   MASTECTOMY Left 1990   BREAST CA   TONSILLECTOMY     UPPER GI ENDOSCOPY N/A 04/17/2015   Procedure: UPPER GI ENDOSCOPY;  Surgeon: Geoffry Paradise, MD;  Location: [facility] ORS;  Service: General;  Laterality: N/A;    Social History   Socioeconomic History   Marital status: Widowed    Spouse name: Not on file   Number of children: Not on file   Years of education: Not  on file   Highest education level: Not on file  Occupational History   Not on file  Tobacco Use   Smoking status: Never   Smokeless tobacco: Never  Vaping Use   Vaping status: Never Used  Substance and Sexual Activity   Alcohol use: No   Drug use: No   Sexual activity: Not on file  Other Topics Concern   Not on file  Social History Narrative   Not on file   Social Drivers of Health   Financial Resource Strain: Not on file  Food Insecurity: Not on file  Transportation Needs: Not on file  Physical Activity: Not on file  Stress: Not on file  Social Connections: Not on file  Intimate Partner Violence: Not on file    Family History  Problem Relation Age of Onset   Heart failure Mother     Hypertension Mother    Hyperlipidemia Mother    Kidney disease Mother    CVA Mother    Hypothyroidism Mother    Breast cancer Maternal Aunt 17    Allergies  Allergen Reactions   Tdap [Tetanus-Diphth-Acell Pertussis] Shortness Of Breath    Outpatient Medications Prior to Visit  Medication Sig   calcium citrate (CALCITRATE - DOSED IN MG ELEMENTAL CALCIUM) 950 (200 Ca) MG tablet Take 200 mg of elemental calcium by mouth daily.   carvedilol (COREG) 3.125 MG tablet Take 1 tablet (3.125 mg total) by mouth 2 (two) times daily.   Cholecalciferol (VITAMIN D3) 1.25 MG (50000 UT) CAPS Take 1 capsule (50,000 Units total) by mouth once a week.   furosemide (LASIX) 20 MG tablet Take 1 tablet (20 mg total) by mouth daily as needed.   Multiple Vitamin (MULTIVITAMIN WITH MINERALS) TABS tablet Take 2 tablets by mouth daily.   Naltrexone-buPROPion HCl ER 8-90 MG TB12 Start 1 tablet every morning for 7 days, then 1 tablet twice daily for 7 days, then 2 tablets every morning and one in the evening, then 2 tablets twice a day if tolerated.   sacubitril-valsartan (ENTRESTO) 97-103 MG Take 1 tablet by mouth 2 (two) times daily.   albuterol (VENTOLIN HFA) 108 (90 Base) MCG/ACT inhaler INHALE 2 PUFFS INTO THE LUNGS FOUR TIMES A DAY (Patient not taking: Reported on 12/11/2023)   meloxicam (MOBIC) 15 MG tablet Take 1 tablet (15 mg total) by mouth daily. (Patient not taking: Reported on 12/11/2023)   No facility-administered medications prior to visit.    Review of Systems  Constitutional: Negative.  Negative for chills, fever, malaise/fatigue and weight loss.  HENT: Negative.    Eyes: Negative.   Respiratory: Negative.  Negative for cough and shortness of breath.   Cardiovascular: Negative.  Negative for chest pain, palpitations and leg swelling.  Gastrointestinal: Negative.  Negative for abdominal pain, constipation, diarrhea, heartburn, nausea and vomiting.  Genitourinary: Negative.  Negative for dysuria and  flank pain.  Musculoskeletal: Negative.  Negative for joint pain and myalgias.  Skin: Negative.   Neurological: Negative.  Negative for dizziness and headaches.  Endo/Heme/Allergies: Negative.   Psychiatric/Behavioral: Negative.  Negative for depression and suicidal ideas. The patient is not nervous/anxious.        Objective:   BP 118/76   Pulse 69   Ht 5' 6.5" (1.689 m)   Wt 227 lb 3.2 oz (103.1 kg)   LMP  (LMP Unknown)   SpO2 97%   BMI 36.12 kg/m   Vitals:   12/11/23 1039  BP: 118/76  Pulse: 69  Height: 5' 6.5" (  1.689 m)  Weight: 227 lb 3.2 oz (103.1 kg)  SpO2: 97%  BMI (Calculated): 36.13    Physical Exam Vitals and nursing note reviewed.  Constitutional:      Appearance: Normal appearance.  HENT:     Head: Normocephalic and atraumatic.     Nose: Nose normal.     Mouth/Throat:     Mouth: Mucous membranes are moist.     Pharynx: Oropharynx is clear.  Eyes:     Conjunctiva/sclera: Conjunctivae normal.     Pupils: Pupils are equal, round, and reactive to light.  Cardiovascular:     Rate and Rhythm: Normal rate and regular rhythm.     Pulses: Normal pulses.     Heart sounds: Normal heart sounds. No murmur heard. Pulmonary:     Effort: Pulmonary effort is normal.     Breath sounds: Normal breath sounds. No wheezing.  Abdominal:     General: Bowel sounds are normal.     Palpations: Abdomen is soft.     Tenderness: There is no abdominal tenderness. There is no right CVA tenderness or left CVA tenderness.  Musculoskeletal:        General: Normal range of motion.     Cervical back: Normal range of motion.     Right lower leg: No edema.     Left lower leg: No edema.  Skin:    General: Skin is warm and dry.  Neurological:     General: No focal deficit present.     Mental Status: She is alert and oriented to person, place, and time.  Psychiatric:        Mood and Affect: Mood normal.        Behavior: Behavior normal.      No results found for any visits  on 12/11/23.  Recent Results (from the past 2160 hours)  VITAMIN D 25 Hydroxy (Vit-D Deficiency, Fractures)     Status: Abnormal   Collection Time: 10/29/23 12:05 PM  Result Value Ref Range   Vit D, 25-Hydroxy 17.17 (L) 30 - 100 ng/mL    Comment: (NOTE) Vitamin D deficiency has been defined by the Institute of Medicine  and an Endocrine Society practice guideline as a level of serum 25-OH  vitamin D less than 20 ng/mL (1,2). The Endocrine Society went on to  further define vitamin D insufficiency as a level between 21 and 29  ng/mL (2).  1. IOM (Institute of Medicine). 2010. Dietary reference intakes for  calcium and D. [place]: [employer]. 2. Holick MF, Binkley Welsh, Bischoff-Ferrari HA, et al. Evaluation,  treatment, and prevention of vitamin D deficiency: an Endocrine  Society clinical practice guideline, JCEM. 2011 Jul; 96(7): 1911-30.  Performed at [facility], [address] [place], [place] [address]   PTH, intact and calcium     Status: Abnormal   Collection Time: 10/29/23 12:05 PM  Result Value Ref Range   PTH 69 (H) 15 - 65 pg/mL   Calcium, Total (PTH) 8.9 8.7 - 10.3 mg/dL   PTH Interp Comment     Comment: (NOTE) Interpretation                 Intact PTH    Calcium                                (pg/mL)      (mg/dL) Normal  15 - 65     8.6 - 10.2 Primary Hyperparathyroidism         >65          >10.2 Secondary Hyperparathyroidism       >65          <10.2 Non-Parathyroid Hypercalcemia       <65          >10.2 Hypoparathyroidism                  <15          < 8.6 Non-Parathyroid Hypocalcemia    15 - 65          < 8.6 Performed At: [facility] [place] [address] [place], [place] [id number] Jolene Schimke MD [id number]   Magnesium     Status: None   Collection Time: 10/29/23 12:05 PM  Result Value Ref Range   Magnesium 2.4 1.7 - 2.4 mg/dL    Comment: Performed at [facility] [address] [place], [place] [address]  Calcitriol (1,25 di-OH Vit D)     Status: Abnormal   Collection Time: 10/29/23 12:05 PM  Result Value Ref Range   Vit D, 1,25-Dihydroxy 99.6 (H) 24.8 - 81.5 pg/mL    Comment: (NOTE) Performed At: [facility] [place] [address] [place], [place] [id number] Jolene Schimke MD [id number]   CBC with Differential/Platelet     Status: Abnormal   Collection Time: 10/29/23 12:05 PM  Result Value Ref Range   WBC 3.0 (L) 4.0 - 10.5 K/uL   RBC 4.53 3.87 - 5.11 MIL/uL   Hemoglobin 13.2 12.0 - 15.0 g/dL   HCT 29.5 18.8 - 41.6 %   MCV 90.5 80.0 - 100.0 fL   MCH 29.1 26.0 - 34.0 pg   MCHC 32.2 30.0 - 36.0 g/dL   RDW 60.6 30.1 - 60.1 %   Platelets 136 (L) 150 - 400 K/uL   nRBC 0.0 0.0 - 0.2 %   Neutrophils Relative % 50 %   Neutro Abs 1.5 (L) 1.7 - 7.7 K/uL   Lymphocytes Relative 40 %   Lymphs Abs 1.2 0.7 - 4.0 K/uL   Monocytes Relative 7 %   Monocytes Absolute 0.2 0.1 - 1.0 K/uL   Eosinophils Relative 3 %   Eosinophils Absolute 0.1 0.0 - 0.5 K/uL   Basophils Relative 0 %   Basophils Absolute 0.0 0.0 - 0.1 K/uL   Immature Granulocytes 0 %   Abs Immature Granulocytes 0.01 0.00 - 0.07 K/uL    Comment: Performed at [facility], [address] [place], [place] [address]  Hemoglobin A1c     Status: None   Collection Time: 10/29/23 12:05 PM  Result Value Ref Range   Hgb A1c MFr Bld 5.1 4.8 - 5.6 %    Comment: (NOTE) Pre diabetes:          5.7%-6.4%  Diabetes:              >6.4%  Glycemic control for   <7.0% adults with diabetes    Mean Plasma Glucose 99.67 mg/dL    Comment: Performed at [facility], [address] [place], [place] [address]  Lipid panel     Status: None   Collection Time: 10/29/23 12:05 PM  Result Value Ref Range   Cholesterol 144 0 - 200 mg/dL   Triglycerides 36 <202 mg/dL   HDL 58 >54 mg/dL   Total CHOL/HDL Ratio 2.5 RATIO   VLDL 7  0 - 40 mg/dL   LDL Cholesterol 79 0 - 99 mg/dL    Comment:        Total  Cholesterol/HDL:CHD Risk Coronary Heart Disease Risk Table                     Men   Women  1/2 Average Risk   3.4   3.3  Average Risk       5.0   4.4  2 X Average Risk   9.6   7.1  3 X Average Risk  23.4   11.0        Use the calculated Patient Ratio above and the CHD Risk Table to determine the patient's CHD Risk.        ATP III CLASSIFICATION (LDL):  <100     mg/dL   Optimal  130-865  mg/dL   Near or Above                    Optimal  130-159  mg/dL   Borderline  784-696  mg/dL   High  >295     mg/dL   Very High Performed at [facility], [address] [place], [place] [address]   Comprehensive metabolic panel     Status: None   Collection Time: 10/29/23 12:05 PM  Result Value Ref Range   Sodium 140 135 - 145 mmol/L   Potassium 4.2 3.5 - 5.1 mmol/L   Chloride 107 98 - 111 mmol/L   CO2 25 22 - 32 mmol/L   Glucose, Bld 80 70 - 99 mg/dL    Comment: Glucose reference range applies only to samples taken after fasting for at least 8 hours.   BUN 13 8 - 23 mg/dL   Creatinine, Ser 2.44 0.44 - 1.00 mg/dL   Calcium 8.9 8.9 - 01.0 mg/dL   Total Protein 7.3 6.5 - 8.1 g/dL   Albumin 4.0 3.5 - 5.0 g/dL   AST 24 15 - 41 U/L   ALT 26 0 - 44 U/L   Alkaline Phosphatase 126 38 - 126 U/L   Total Bilirubin 0.6 <1.2 mg/dL   GFR, Estimated >27 >25 mL/min    Comment: (NOTE) Calculated using the CKD-EPI Creatinine Equation (2021)    Anion gap 8 5 - 15    Comment: Performed at [facility], [address] [place], [place] [address]  Renal function panel     Status: None   Collection Time: 10/29/23 12:06 PM  Result Value Ref Range   Sodium 140 135 - 145 mmol/L   Potassium 4.3 3.5 - 5.1 mmol/L   Chloride 107 98 - 111 mmol/L   CO2 26 22 - 32 mmol/L   Glucose, Bld 79 70 - 99 mg/dL    Comment: Glucose reference range applies only to samples taken after fasting for at least 8 hours.   BUN 14 8 - 23 mg/dL   Creatinine, Ser 0.34 0.44 - 1.00 mg/dL   Calcium 8.9 8.9 -  74.2 mg/dL   Phosphorus 3.5 2.5 - 4.6 mg/dL   Albumin 3.8 3.5 - 5.0 g/dL   GFR, Estimated >59 >56 mL/min    Comment: (NOTE) Calculated using the CKD-EPI Creatinine Equation (2021)    Anion gap 7 5 - 15    Comment: Performed at [facility], [address], [place], [place] [address]  Basic metabolic panel     Status: None   Collection Time: 11/08/23 10:10 AM  Result  Value Ref Range   Sodium 138 135 - 145 mmol/L   Potassium 4.1 3.5 - 5.1 mmol/L   Chloride 105 98 - 111 mmol/L   CO2 26 22 - 32 mmol/L   Glucose, Bld 92 70 - 99 mg/dL    Comment: Glucose reference range applies only to samples taken after fasting for at least 8 hours.   BUN 19 8 - 23 mg/dL   Creatinine, Ser 0.10 0.44 - 1.00 mg/dL   Calcium 9.3 8.9 - 27.2 mg/dL   GFR, Estimated >53 >66 mL/min    Comment: (NOTE) Calculated using the CKD-EPI Creatinine Equation (2021)    Anion gap 7 5 - 15    Comment: Performed at [facility], [address] [place], [place] [address]  CBC     Status: Abnormal   Collection Time: 11/08/23 10:10 AM  Result Value Ref Range   WBC 2.6 (L) 4.0 - 10.5 K/uL   RBC 4.90 3.87 - 5.11 MIL/uL   Hemoglobin 14.2 12.0 - 15.0 g/dL   HCT 74.2 59.5 - 63.8 %   MCV 90.0 80.0 - 100.0 fL   MCH 29.0 26.0 - 34.0 pg   MCHC 32.2 30.0 - 36.0 g/dL   RDW 75.6 43.3 - 29.5 %   Platelets 136 (L) 150 - 400 K/uL   nRBC 0.0 0.0 - 0.2 %    Comment: Performed at [facility], [address] [place], [place] [address]  Troponin I (High Sensitivity)     Status: None   Collection Time: 11/08/23 10:10 AM  Result Value Ref Range   Troponin I (High Sensitivity) 5 <18 ng/L    Comment: (NOTE) Elevated high sensitivity troponin I (hsTnI) values and significant  changes across serial measurements may suggest ACS but many other  chronic and acute conditions are known to elevate hsTnI results.  Refer to the "Links" section for chest pain algorithms and additional  guidance. Performed at  [facility], [address] [place], [place] [address]       Assessment & Plan:  Patient advised to continue her medications.  Follow-up with the cardiologist. Will try Contrave if tolerated. Problem List Items Addressed This Visit     Essential hypertension, benign - Primary   Atrial fibrillation (HCC)   GAD (generalized anxiety disorder)   Vitamin D deficiency   Class 2 obesity due to excess calories without serious comorbidity with body mass index (BMI) of 35.0 to 35.9 in adult    Return in about 2 months (around 02/08/2024).   Total time spent: 30 minutes  Margaretann Loveless, MD  12/11/2023   This document may have been prepared by [facility] Voice Recognition software and as such may include unintentional dictation errors.

## 2023-12-14 LAB — PTH, INTACT AND CALCIUM
Calcium, Total (PTH): 9.4 mg/dL (ref 8.7–10.3)
PTH: 62 pg/mL (ref 15–65)

## 2023-12-17 ENCOUNTER — Encounter: Payer: Self-pay | Admitting: "Endocrinology

## 2023-12-17 ENCOUNTER — Ambulatory Visit (INDEPENDENT_AMBULATORY_CARE_PROVIDER_SITE_OTHER): Payer: 59 | Admitting: "Endocrinology

## 2023-12-17 VITALS — BP 110/70 | HR 77 | Ht 66.5 in | Wt 222.6 lb

## 2023-12-17 DIAGNOSIS — E559 Vitamin D deficiency, unspecified: Secondary | ICD-10-CM | POA: Diagnosis not present

## 2023-12-17 DIAGNOSIS — M810 Age-related osteoporosis without current pathological fracture: Secondary | ICD-10-CM

## 2023-12-17 NOTE — Progress Notes (Signed)
[facility] Note Altamese Woodland, MD    Referring Provider: Margaretann Loveless, MD Primary Care Provider: Margaretann Loveless, MD Chief Complaint  Patient presents with   Follow-up    Assessment & Plan  Mandy Roberts was seen today for follow-up.  Diagnoses and all orders for this visit:  Age-related osteoporosis without current pathological fracture -     VITAMIN D 25 Hydroxy (Vit-D Deficiency, Fractures) -     Cancel: Vitamin D 1,25 dihydroxy; Future -     Vitamin D 1,25 dihydroxy -     VITAMIN D 25 Hydroxy (Vit-D Deficiency, Fractures) -     Vitamin D 1,25 dihydroxy; Future -     Renal function panel -     PTH, intact and calcium  Vitamin D deficiency   Osteoporosis Likely secondary cause from age.  BMD results suggest: T-score -3.5 L-spine. History of duodenal switch. Recommend to use calcium citrate 500 mg twice daily OTC and vitamin D 50,000 units Vit D weekly. Continue weight bearing exercise options and dietary supplements.  Not a candidate of forteo due to history of recurrent breast cancer Educated on risks and side effects of reclast including atypical femoral fractures and osteonecrosis of the jaw. Patient understands and would like advance with reclast treatment post Vit D improvement  Continue to follow fall precautions, adequate dairy in diet and exercises (aerobic, balancing and weight bearing) as tolerated.   Vit D deficiency Low 25 Vit D with high 1,25 Vit D On 50,000 units Vit D weekly Repeat albs in 6 weeks   Hyperparathyroidism with mildly elevated PTH at 69 and elevated 1,25 D at 99.6 Phosphorus is normal and calcium is low normal Repeat PTH WNL after Vit D supplementation    Return in about 4 months (around 04/15/2024) for visit, labs today.  I have reviewed current medications, nurse's notes, allergies, vital signs, past medical and surgical history, family medical history, and social history for this encounter. Counseled patient on symptoms,  examination findings, lab findings, imaging results, treatment decisions and monitoring and prognosis. The patient understood the recommendations and agrees with the treatment plan. All questions regarding treatment plan were fully answered.   Altamese [place], MD   12/17/23    History of Present Illness Mandy Roberts is a 63 y.o. year old female who presents to our clinic with osteoporosis diagnosed in 2021. BMD completed on 07/2023 suggested osteoporosis.   She is currently taking Calcium citrate 500 mg bid and vitamin D 40981 international units once weekly.  Continues to have bone pains   No falls/fracutres  Has chemotherapy in 1990 and radiation in 1997 for breast cancer.  Couldn't tolerate fosamax due to "body pains".  Has duodenal switch in 2016.    Risk Factors screening:  History of low trauma osteoporotic fractures: No Family history of osteoporosis: Yes Hip fracture in first-degree relatives: No Smoking history: No Excessive alcohol intake >2 drinks/day: No Excessive caffeine intake >2 drinks/day: Yes Glucocorticoid use >5mg  prednisone/day for >3 months: No Rheumatoid arthritis history: No Premature/Surgical Menopause: No  Physical Exam  BP 110/70 (BP Location: Left Arm, Patient Position: Sitting, Cuff Size: Large)   Pulse 77   Ht 5' 6.5" (1.689 m)   Wt 222 lb 9.6 oz (101 kg)   LMP  (LMP Unknown)   SpO2 95%   BMI 35.39 kg/m  Constitutional: well developed, well nourished Head: normocephalic, atraumatic Eyes: sclera anicteric, no redness Neck: supple Lungs: normal respiratory effort Neurology: alert and oriented  Skin: dry, no appreciable rashes Musculoskeletal: no appreciable defects Psychiatric: normal mood and affect  Allergies Allergies  Allergen Reactions   Tdap [Tetanus-Diphth-Acell Pertussis] Shortness Of Breath    Current Medications Patient's Medications  New Prescriptions   No medications on file  Previous Medications   ALBUTEROL  (VENTOLIN HFA) 108 (90 BASE) MCG/ACT INHALER    INHALE 2 PUFFS INTO THE LUNGS FOUR TIMES A DAY   CALCIUM CITRATE (CALCITRATE - DOSED IN MG ELEMENTAL CALCIUM) 950 (200 CA) MG TABLET    Take 200 mg of elemental calcium by mouth daily.   CARVEDILOL (COREG) 3.125 MG TABLET    Take 1 tablet (3.125 mg total) by mouth 2 (two) times daily.   CHOLECALCIFEROL (VITAMIN D3) 1.25 MG (50000 UT) CAPS    Take 1 capsule (50,000 Units total) by mouth once a week.   FUROSEMIDE (LASIX) 20 MG TABLET    Take 1 tablet (20 mg total) by mouth daily as needed.   MELOXICAM (MOBIC) 15 MG TABLET    Take 1 tablet (15 mg total) by mouth daily.   MULTIPLE VITAMIN (MULTIVITAMIN WITH MINERALS) TABS TABLET    Take 2 tablets by mouth daily.   NALTREXONE-BUPROPION HCL ER 8-90 MG TB12    Start 1 tablet every morning for 7 days, then 1 tablet twice daily for 7 days, then 2 tablets every morning and one in the evening, then 2 tablets twice a day if tolerated.   SACUBITRIL-VALSARTAN (ENTRESTO) 97-103 MG    Take 1 tablet by mouth 2 (two) times daily.  Modified Medications   No medications on file  Discontinued Medications   No medications on file     Past Medical History Past Medical History:  Diagnosis Date   Atrial fibrillation (HCC)    Breast cancer (HCC) 1990/1997    left mastectomy ,had recurrence in left suprclavic nodes several yrs ago-treated with radiation and chemo   CHF (congestive heart failure) (HCC)    Colon polyp 2008   Complication of anesthesia    Difficult intubation    expectorated blood 3-4 days after surgery in 2011   Difficult intubation    hypoxia with endoscopy in 2016   GERD (gastroesophageal reflux disease)    History of chemotherapy    5-FU/AC in 1990 under the care of Dr. Doylene Canning. pt also tx with tamoxifen and arimidex   History of radiation therapy    radiation treat at [facility]   Hypertension 2008   Personal history of chemotherapy 1990   BREAST CA   Personal history of malignant neoplasm of  breast 1990    left mastectomy    Personal history of radiation therapy 1997   BREAST CA   Sinusitis    Sleep apnea    history of, went away after gastric surgery    Past Surgical History Past Surgical History:  Procedure Laterality Date   abdomenoplasty     ABDOMINAL HYSTERECTOMY  1996   COLONOSCOPY  2008, 2015   Dr. Evette Cristal   COLONOSCOPY WITH PROPOFOL N/A 10/12/2019   Procedure: COLONOSCOPY WITH PROPOFOL;  Surgeon: Earline Mayotte, MD;  Location: [facility];  Service: Endoscopy;  Laterality: N/A;  DR BYRNETT WILL UPDATE H&P ON PROCEDURE DATE   DILATION AND CURETTAGE OF UTERUS     KNEE ARTHROPLASTY Left 09/07/2017   Procedure: COMPUTER ASSISTED TOTAL KNEE ARTHROPLASTY;  Surgeon: Donato Heinz, MD;  Location: [facility];  Service: Orthopedics;  Laterality: Left;   KNEE ARTHROSCOPY Bilateral    KNEE ARTHROSCOPY  Right 06/24/2022   Procedure: RIGHT KNEE ARTHROSCOPY;  Surgeon: Marcene Corning, MD;  Location: [facility];  Service: Orthopedics;  Laterality: Right;   KNEE SURGERY Left 2009, 2010, 2012   LAPAROSCOPIC GASTRIC RESTRICTIVE DUODENAL PROCEDURE (DUODENAL SWITCH) N/A 04/17/2015   Procedure: LAPAROSCOPIC GASTRIC RESTRICTIVE DUODENAL PROCEDURE, single anastamosis ;  Surgeon: Geoffry Paradise, MD;  Location: [facility];  Service: General;  Laterality: N/A;   MASTECTOMY Left 1990   BREAST CA   TONSILLECTOMY     UPPER GI ENDOSCOPY N/A 04/17/2015   Procedure: UPPER GI ENDOSCOPY;  Surgeon: Geoffry Paradise, MD;  Location: [facility];  Service: General;  Laterality: N/A;    Family History family history includes Breast cancer (age of onset: 32) in her maternal aunt; CVA in her mother; Heart failure in her mother; Hyperlipidemia in her mother; Hypertension in her mother; Hypothyroidism in her mother; Kidney disease in her mother.  Social History Social History   Socioeconomic History   Marital status: Widowed    Spouse name: Not on file   Number of children: Not on file   Years of education:  Not on file   Highest education level: Not on file  Occupational History   Not on file  Tobacco Use   Smoking status: Never   Smokeless tobacco: Never  Vaping Use   Vaping status: Never Used  Substance and Sexual Activity   Alcohol use: No   Drug use: No   Sexual activity: Not on file  Other Topics Concern   Not on file  Social History Narrative   Not on file   Social Drivers of Health   Financial Resource Strain: Not on file  Food Insecurity: Not on file  Transportation Needs: Not on file  Physical Activity: Not on file  Stress: Not on file  Social Connections: Not on file  Intimate Partner Violence: Not on file    Laboratory Investigations No components found for: "CMP" No components found for: "BMP" No results found for: "GFR" Lab Results  Component Value Date   CREATININE 0.84 12/11/2023   No results found for: "CBC" No components found for: "LFT" No components found for: "VITD" Lab Results  Component Value Date   PTH 62 12/11/2023   PTH Comment 12/11/2023   Lab Results  Component Value Date   TSH 1.560 06/30/2023   No components found for: "RENAL FUNCTION" No components found for: "MAGNESIUM"  Parts of this note may have been dictated using voice recognition software. There may be variances in spelling and vocabulary which are unintentional. Not all errors are proofread. Please notify the Thereasa Parkin if any discrepancies are noted or if the meaning of any statement is not clear.

## 2023-12-21 ENCOUNTER — Encounter: Payer: Self-pay | Admitting: "Endocrinology

## 2023-12-22 ENCOUNTER — Other Ambulatory Visit: Payer: Self-pay | Admitting: "Endocrinology

## 2023-12-22 ENCOUNTER — Telehealth: Payer: Self-pay

## 2023-12-22 DIAGNOSIS — M810 Age-related osteoporosis without current pathological fracture: Secondary | ICD-10-CM | POA: Insufficient documentation

## 2023-12-22 LAB — VITAMIN D 25 HYDROXY (VIT D DEFICIENCY, FRACTURES): Vit D, 25-Hydroxy: 26 ng/mL — ABNORMAL LOW (ref 30–100)

## 2023-12-22 LAB — VITAMIN D 1,25 DIHYDROXY
Vitamin D 1, 25 (OH)2 Total: 97 pg/mL — ABNORMAL HIGH (ref 18–72)
Vitamin D2 1, 25 (OH)2: <8 pg/mL
Vitamin D3 1, 25 (OH)2: 97 pg/mL

## 2023-12-22 NOTE — Telephone Encounter (Signed)
Patient given medication changes as directed by MD. No further questions at this time.

## 2023-12-22 NOTE — Telephone Encounter (Signed)
-----   Message from [facility] sent at 12/22/2023  1:47 PM EST ----- Please let the patient know that I have her results back.  I am going to order Reclast for her.  In the meanwhile I would like her to continue taking vitamin D 50,000 units once a month (not weekly) as well as calcium citrate 500 mg twice daily.  If she needs any refills, please let me know.  Thank you

## 2023-12-23 ENCOUNTER — Telehealth: Payer: Self-pay

## 2023-12-23 NOTE — Telephone Encounter (Signed)
Dr. Roosevelt Locks, patient will be scheduled as soon as possible.  Auth Submission: NO AUTH NEEDED Site of care: Site of care: [facility] Payer: [employer] Medication & CPT/J Code(s) submitted: Reclast (Zolendronic acid) [id number] Route of submission (phone, fax, portal):  Phone # Fax # Auth type: Buy/Bill PB Units/visits requested: 5mg  x 1 dose Reference number:  Approval from: 12/23/23 to 11/23/24

## 2023-12-29 ENCOUNTER — Ambulatory Visit: Payer: 59

## 2024-01-27 ENCOUNTER — Ambulatory Visit: Payer: 59

## 2024-01-27 VITALS — BP 122/77 | HR 61 | Temp 98.1°F | Resp 18 | Ht 66.5 in | Wt 228.4 lb

## 2024-01-27 DIAGNOSIS — M810 Age-related osteoporosis without current pathological fracture: Secondary | ICD-10-CM

## 2024-01-27 MED ORDER — ACETAMINOPHEN 325 MG PO TABS
650.0000 mg | ORAL_TABLET | Freq: Once | ORAL | Status: AC
  Administered 2024-01-27: 650 mg via ORAL
  Filled 2024-01-27: qty 2

## 2024-01-27 MED ORDER — DIPHENHYDRAMINE HCL 25 MG PO CAPS
25.0000 mg | ORAL_CAPSULE | Freq: Once | ORAL | Status: AC
  Administered 2024-01-27: 25 mg via ORAL
  Filled 2024-01-27: qty 1

## 2024-01-27 MED ORDER — ZOLEDRONIC ACID 5 MG/100ML IV SOLN
5.0000 mg | Freq: Once | INTRAVENOUS | Status: AC
  Administered 2024-01-27: 5 mg via INTRAVENOUS
  Filled 2024-01-27: qty 100

## 2024-01-27 NOTE — Progress Notes (Signed)
 Diagnosis: Osteoporosis  Provider:  Chilton Greathouse MD  Procedure: IV Infusion  IV Type: Peripheral, IV Location: L Forearm  Reclast (Zolendronic Acid), Dose: 5 mg  Infusion Start Time: 0855  Infusion Stop Time: 0925  Post Infusion IV Care: Observation period completed and Peripheral IV Discontinued  Discharge: Condition: Good, Destination: Home . AVS Declined  Performed by:  Rico Ala, LPN

## 2024-02-09 ENCOUNTER — Ambulatory Visit: Payer: 59 | Admitting: Internal Medicine

## 2024-02-09 ENCOUNTER — Encounter: Payer: Self-pay | Admitting: Internal Medicine

## 2024-02-09 VITALS — BP 122/72 | HR 87 | Ht 66.5 in | Wt 218.6 lb

## 2024-02-09 DIAGNOSIS — E66812 Obesity, class 2: Secondary | ICD-10-CM | POA: Diagnosis not present

## 2024-02-09 DIAGNOSIS — I480 Paroxysmal atrial fibrillation: Secondary | ICD-10-CM | POA: Diagnosis not present

## 2024-02-09 DIAGNOSIS — E782 Mixed hyperlipidemia: Secondary | ICD-10-CM

## 2024-02-09 DIAGNOSIS — I10 Essential (primary) hypertension: Secondary | ICD-10-CM | POA: Diagnosis not present

## 2024-02-09 DIAGNOSIS — E6609 Other obesity due to excess calories: Secondary | ICD-10-CM

## 2024-02-09 DIAGNOSIS — E538 Deficiency of other specified B group vitamins: Secondary | ICD-10-CM | POA: Diagnosis not present

## 2024-02-09 DIAGNOSIS — Z6835 Body mass index (BMI) 35.0-35.9, adult: Secondary | ICD-10-CM | POA: Diagnosis not present

## 2024-02-09 NOTE — Progress Notes (Signed)
 Established Patient Office Visit  Subjective:  Patient ID: Mandy Roberts, female    DOB: February 27, 1961  Age: 63 y.o. MRN: [id number]  Chief Complaint  Patient presents with   Follow-up    2 month follow up    Patient is here for her follow-up today.  She has lost 10 pounds in 2 weeks since she started using compounded Mounjaro 7.5 mg/week.  It is still expensive.  She occasionally feels nausea, upset stomach and dizziness.  Feels like her sugar is dropping so she eats something sweet.  Patient advised to stay hydrated.  Denies chest pain or shortness of breath.    No other concerns at this time.   Past Medical History:  Diagnosis Date   Atrial fibrillation (HCC)    Breast cancer [facility]) 1990/1997    left mastectomy ,had recurrence in left suprclavic nodes several yrs ago-treated with radiation and chemo   CHF (congestive heart failure) (HCC)    Colon polyp 2008   Complication of anesthesia    Difficult intubation    expectorated blood 3-4 days after surgery in 2011   Difficult intubation    hypoxia with endoscopy in 2016   GERD (gastroesophageal reflux disease)    History of chemotherapy    5-FU/AC in 1990 under the care of Dr. Doylene Canning. pt also tx with tamoxifen and arimidex   History of radiation therapy    radiation treat at [facility]   Hypertension 2008   Personal history of chemotherapy 1990   BREAST CA   Personal history of malignant neoplasm of breast 1990    left mastectomy    Personal history of radiation therapy 1997   BREAST CA   Sinusitis    Sleep apnea    history of, went away after gastric surgery    Past Surgical History:  Procedure Laterality Date   abdomenoplasty     ABDOMINAL HYSTERECTOMY  1996   COLONOSCOPY  2008, 2015   Dr. Evette Cristal   COLONOSCOPY WITH PROPOFOL N/A 10/12/2019   Procedure: COLONOSCOPY WITH PROPOFOL;  Surgeon: Earline Mayotte, MD;  Location: [facility];  Service: Endoscopy;  Laterality: N/A;  DR BYRNETT WILL UPDATE H&P ON PROCEDURE  DATE   DILATION AND CURETTAGE OF UTERUS     KNEE ARTHROPLASTY Left 09/07/2017   Procedure: COMPUTER ASSISTED TOTAL KNEE ARTHROPLASTY;  Surgeon: Donato Heinz, MD;  Location: [facility];  Service: Orthopedics;  Laterality: Left;   KNEE ARTHROSCOPY Bilateral    KNEE ARTHROSCOPY Right 06/24/2022   Procedure: RIGHT KNEE ARTHROSCOPY;  Surgeon: Marcene Corning, MD;  Location: [facility];  Service: Orthopedics;  Laterality: Right;   KNEE SURGERY Left 2009, 2010, 2012   LAPAROSCOPIC GASTRIC RESTRICTIVE DUODENAL PROCEDURE (DUODENAL SWITCH) N/A 04/17/2015   Procedure: LAPAROSCOPIC GASTRIC RESTRICTIVE DUODENAL PROCEDURE, single anastamosis ;  Surgeon: Geoffry Paradise, MD;  Location: [facility];  Service: General;  Laterality: N/A;   MASTECTOMY Left 1990   BREAST CA   TONSILLECTOMY     UPPER GI ENDOSCOPY N/A 04/17/2015   Procedure: UPPER GI ENDOSCOPY;  Surgeon: Geoffry Paradise, MD;  Location: [facility];  Service: General;  Laterality: N/A;    Social History   Socioeconomic History   Marital status: Widowed    Spouse name: Not on file   Number of children: Not on file   Years of education: Not on file   Highest education level: Not on file  Occupational History   Not on file  Tobacco Use   Smoking status: Never  Smokeless tobacco: Never  Vaping Use   Vaping status: Never Used  Substance and Sexual Activity   Alcohol use: No   Drug use: No   Sexual activity: Not on file  Other Topics Concern   Not on file  Social History Narrative   Not on file   Social Drivers of Health   Financial Resource Strain: Not on file  Food Insecurity: Not on file  Transportation Needs: Not on file  Physical Activity: Not on file  Stress: Not on file  Social Connections: Not on file  Intimate Partner Violence: Not on file    Family History  Problem Relation Age of Onset   Heart failure Mother    Hypertension Mother    Hyperlipidemia Mother    Kidney disease Mother    CVA Mother    Hypothyroidism Mother     Breast cancer Maternal Aunt 86    Allergies  Allergen Reactions   Tdap [Tetanus-Diphth-Acell Pertussis] Shortness Of Breath    Outpatient Medications Prior to Visit  Medication Sig   calcium citrate (CALCITRATE - DOSED IN MG ELEMENTAL CALCIUM) 950 (200 Ca) MG tablet Take 200 mg of elemental calcium by mouth daily.   carvedilol (COREG) 3.125 MG tablet Take 1 tablet (3.125 mg total) by mouth 2 (two) times daily.   Cholecalciferol (VITAMIN D3) 1.25 MG (50000 UT) CAPS Take 1 capsule (50,000 Units total) by mouth once a week.   furosemide (LASIX) 20 MG tablet Take 1 tablet (20 mg total) by mouth daily as needed.   Multiple Vitamin (MULTIVITAMIN WITH MINERALS) TABS tablet Take 2 tablets by mouth daily.   sacubitril-valsartan (ENTRESTO) 97-103 MG Take 1 tablet by mouth 2 (two) times daily.   zoledronic acid (RECLAST) 5 MG/100ML SOLN injection Inject 5 mg into the vein once.   albuterol (VENTOLIN HFA) 108 (90 Base) MCG/ACT inhaler INHALE 2 PUFFS INTO THE LUNGS FOUR TIMES A DAY (Patient not taking: Reported on 12/11/2023)   [DISCONTINUED] meloxicam (MOBIC) 15 MG tablet Take 1 tablet (15 mg total) by mouth daily. (Patient not taking: Reported on 02/09/2024)   [DISCONTINUED] Naltrexone-buPROPion HCl ER 8-90 MG TB12 Start 1 tablet every morning for 7 days, then 1 tablet twice daily for 7 days, then 2 tablets every morning and one in the evening, then 2 tablets twice a day if tolerated. (Patient not taking: Reported on 02/09/2024)   No facility-administered medications prior to visit.    Review of Systems  Constitutional:  Positive for weight loss. Negative for chills, fever and malaise/fatigue.  HENT: Negative.  Negative for sore throat.   Eyes: Negative.   Respiratory: Negative.  Negative for cough and shortness of breath.   Cardiovascular: Negative.  Negative for chest pain, palpitations and leg swelling.  Gastrointestinal: Negative.  Negative for abdominal pain, constipation, diarrhea, heartburn,  nausea and vomiting.  Genitourinary: Negative.  Negative for dysuria and flank pain.  Musculoskeletal: Negative.  Negative for joint pain and myalgias.  Skin: Negative.   Neurological: Negative.  Negative for dizziness, tingling, tremors and headaches.  Endo/Heme/Allergies: Negative.   Psychiatric/Behavioral: Negative.  Negative for depression and suicidal ideas. The patient is not nervous/anxious.        Objective:   BP 122/72   Pulse 87   Ht 5' 6.5" (1.689 m)   Wt 218 lb 9.6 oz (99.2 kg)   LMP  (LMP Unknown)   SpO2 97%   BMI 34.75 kg/m   Vitals:   02/09/24 1033  BP: 122/72  Pulse: 87  Height: 5' 6.5" (1.689 m)  Weight: 218 lb 9.6 oz (99.2 kg)  SpO2: 97%  BMI (Calculated): 34.76    Physical Exam Vitals and nursing note reviewed.  Constitutional:      Appearance: Normal appearance.  HENT:     Head: Normocephalic and atraumatic.     Nose: Nose normal.     Mouth/Throat:     Mouth: Mucous membranes are moist.     Pharynx: Oropharynx is clear.  Eyes:     Conjunctiva/sclera: Conjunctivae normal.     Pupils: Pupils are equal, round, and reactive to light.  Cardiovascular:     Rate and Rhythm: Normal rate and regular rhythm.     Pulses: Normal pulses.     Heart sounds: Normal heart sounds. No murmur heard. Pulmonary:     Effort: Pulmonary effort is normal.     Breath sounds: Normal breath sounds. No wheezing.  Abdominal:     General: Bowel sounds are normal.     Palpations: Abdomen is soft.     Tenderness: There is no abdominal tenderness. There is no right CVA tenderness or left CVA tenderness.  Musculoskeletal:        General: Normal range of motion.     Cervical back: Normal range of motion.     Right lower leg: No edema.     Left lower leg: No edema.  Skin:    General: Skin is warm and dry.  Neurological:     General: No focal deficit present.     Mental Status: She is alert and oriented to person, place, and time.  Psychiatric:        Mood and Affect:  Mood normal.        Behavior: Behavior normal.      No results found for any visits on 02/09/24.  Recent Results (from the past 2160 hours)  PTH, intact and calcium     Status: None   Collection Time: 12/11/23  3:02 PM  Result Value Ref Range   PTH 62 15 - 65 pg/mL   Calcium, Total (PTH) 9.4 8.7 - 10.3 mg/dL   PTH Interp Comment     Comment: (NOTE) Interpretation                 Intact PTH    Calcium                                (pg/mL)      (mg/dL) Normal                          15 - 65     8.6 - 10.2 Primary Hyperparathyroidism         >65          >10.2 Secondary Hyperparathyroidism       >65          <10.2 Non-Parathyroid Hypercalcemia       <65          >10.2 Hypoparathyroidism                  <15          < 8.6 Non-Parathyroid Hypocalcemia    15 - 65          < 8.6 Performed At: [facility] [address] [place], [place] [id number] Jolene Schimke MD NW:[id number]   Renal function panel  Status: None   Collection Time: 12/11/23  3:02 PM  Result Value Ref Range   Sodium 142 135 - 145 mmol/L   Potassium 4.1 3.5 - 5.1 mmol/L   Chloride 109 98 - 111 mmol/L   CO2 26 22 - 32 mmol/L   Glucose, Bld 77 70 - 99 mg/dL    Comment: Glucose reference range applies only to samples taken after fasting for at least 8 hours.   BUN 18 8 - 23 mg/dL   Creatinine, Ser 8.75 0.44 - 1.00 mg/dL   Calcium 9.1 8.9 - 64.3 mg/dL   Phosphorus 3.6 2.5 - 4.6 mg/dL   Albumin 3.6 3.5 - 5.0 g/dL   GFR, Estimated >32 >95 mL/min    Comment: (NOTE) Calculated using the CKD-EPI Creatinine Equation (2021)    Anion gap 7 5 - 15    Comment: Performed at [facility], [address], [place], [place] [address]  VITAMIN D 25 Hydroxy (Vit-D Deficiency, Fractures)     Status: Abnormal   Collection Time: 12/17/23 10:08 AM  Result Value Ref Range   Vit D, 25-Hydroxy 26 (L) 30 - 100 ng/mL    Comment: Vitamin D Status         25-OH Vitamin D: . Deficiency:                    <20  ng/mL Insufficiency:             20 - 29 ng/mL Optimal:                 > or = 30 ng/mL . For 25-OH Vitamin D testing on patients on  D2-supplementation and patients for whom quantitation  of D2 and D3 fractions is required, the Quest[employer]ureD(TM) 25-OH VIT D, (D2,D3), LC/MS/MS is recommended: order  code 66063 (patients >62yrs). . See Note 1 . Note 1 . For additional information, please refer to  http://education.[facility].com/faq/FAQ199  (This link is being provided for informational/ educational purposes only.)   Vitamin D 1,25 dihydroxy     Status: Abnormal   Collection Time: 12/17/23 10:08 AM  Result Value Ref Range   Vitamin D 1, 25 (OH)2 Total 97 (H) 18 - 72 pg/mL   Vitamin D3 1, 25 (OH)2 97 pg/mL   Vitamin D2 1, 25 (OH)2 <8 pg/mL    Comment: (Note) Vitamin D3, 1,25(OH)2 indicates both endogenous  production and supplementation. Vitamin D2, 1,25(OH)2 is  an indicator of exogenous sources, such as diet or  supplementation. Interpretation and therapy are based on  measurement of Vitamin D, 1,25 (OH)2, Total. . This test was developed, and its analytical performance  characteristics have been determined by [employer]. It has not been cleared or approved by the  FDA. This assay has been validated pursuant to the CLIA  regulations and is used for clinical purposes. . For additional information, please refer to http://education.[facility].com/faq/FAQ199 (This link is being provided for  informational/educational purposes only.) . [facility] [address] [facility] [address] [place] [address] [phone] Junita Push L. Thompson Caul, MD, PhD       Assessment & Plan:  Patient advised to continue taking her medications.  Will check her labs in 2 months. Problem List Items Addressed This Visit     Essential hypertension, benign   Atrial fibrillation (HCC)   Class 2 obesity due to excess calories without serious comorbidity with body mass  index (BMI) of 35.0 to 35.9 in adult - Primary  Mixed hyperlipidemia   Other Visit Diagnoses       Vitamin B12 deficiency           Return in about 2 months (around 04/10/2024).   Total time spent: 25 minutes  Margaretann Loveless, MD  02/09/2024   This document may have been prepared by [facility] Voice Recognition software and as such may include unintentional dictation errors.

## 2024-04-12 ENCOUNTER — Ambulatory Visit: Admitting: Internal Medicine

## 2024-04-13 ENCOUNTER — Ambulatory Visit: Payer: 59 | Admitting: "Endocrinology

## 2024-05-24 ENCOUNTER — Ambulatory Visit: Admitting: "Endocrinology

## 2024-06-06 ENCOUNTER — Ambulatory Visit: Admitting: "Endocrinology

## 2024-07-19 ENCOUNTER — Encounter: Payer: Self-pay | Admitting: "Endocrinology

## 2024-07-19 ENCOUNTER — Encounter: Payer: Self-pay | Admitting: Gastroenterology

## 2024-07-19 ENCOUNTER — Ambulatory Visit (INDEPENDENT_AMBULATORY_CARE_PROVIDER_SITE_OTHER): Admitting: "Endocrinology

## 2024-07-19 VITALS — BP 130/80 | HR 84 | Ht 66.5 in | Wt 231.0 lb

## 2024-07-19 DIAGNOSIS — M810 Age-related osteoporosis without current pathological fracture: Secondary | ICD-10-CM

## 2024-07-19 NOTE — Progress Notes (Signed)
 [facility] Note Mandy Birmingham, MD    Referring Provider: Fernand Fredy RAMAN, MD Primary Care Provider: Fernand Fredy RAMAN, MD No chief complaint on file.   Assessment & Plan  Diagnoses and all orders for this visit:  Age-related osteoporosis without current pathological fracture  Likely secondary cause from age.  BMD results suggest: T-score -3.5 L-spine. History of duodenal switch. Recommend to use calcium citrate 500 mg twice daily OTC and vitamin D  50,000 units Vit D weekly. Continue weight bearing exercise options and dietary supplements.  S/p reclast  in 01/2024-well tolerated  Not a candidate of forteo due to history of recurrent breast cancer Educated on risks and side effects of reclast  including atypical femoral fractures and osteonecrosis of the jaw. Patient understands and would like advance with reclast  treatment post Vit D improvement  Continue to follow fall precautions, adequate dairy in diet and exercises (aerobic, balancing and weight bearing) as tolerated.   Vit D deficiency Low 25 Vit D with high 1,25 Vit D Stopped 50,000 units Vit D weekly Repeat labs now  Hyperparathyroidism with mildly elevated PTH at 69 and elevated 1,25 D at 99.6 Phosphorus is normal and calcium is low normal Repeat PTH WNL after Vit D supplementation    Return in about 2 months (around 09/18/2024) for visit, labs today.  I have reviewed current medications, nurse's notes, allergies, vital signs, past medical and surgical history, family medical history, and social history for this encounter. Counseled patient on symptoms, examination findings, lab findings, imaging results, treatment decisions and monitoring and prognosis. The patient understood the recommendations and agrees with the treatment plan. All questions regarding treatment plan were fully answered.   Mandy Birmingham, MD   07/19/24    History of Present Illness Mandy Roberts is a 63 y.o. year old female who presents  to our clinic with osteoporosis diagnosed in 2021. BMD completed on 07/2023 suggested osteoporosis.   No recent falls/fractures  She is currently taking Calcium citrate 500 mg qd  Stopped vitamin D  50,000 international units once weekly.  Continues to have usual bone pains   Has chemotherapy in 1990 and radiation in 1997 for breast cancer.  Couldn't tolerate fosamax due to body pains.  Has duodenal switch in 2016.  S/p reclast  in 01/2024-well tolerated     Risk Factors screening:  History of low trauma osteoporotic fractures: No Family history of osteoporosis: Yes Hip fracture in first-degree relatives: No Smoking history: No Excessive alcohol intake >2 drinks/day: No Excessive caffeine intake >2 drinks/day: Yes Glucocorticoid use >5mg  prednisone /day for >3 months: No Rheumatoid arthritis history: No Premature/Surgical Menopause: No  Physical Exam  BP 130/80   Pulse 84   Ht 5' 6.5 (1.689 m)   Wt 231 lb (104.8 kg)   LMP  (LMP Unknown)   SpO2 96%   BMI 36.73 kg/m  Constitutional: well developed, well nourished Head: normocephalic, atraumatic Eyes: sclera anicteric, no redness Neck: supple Lungs: normal respiratory effort Neurology: alert and oriented Skin: dry, no appreciable rashes Musculoskeletal: no appreciable defects Psychiatric: normal mood and affect  Allergies Allergies  Allergen Reactions   Tdap [Tetanus-Diphth-Acell Pertussis] Shortness Of Breath    Current Medications Patient's Medications  New Prescriptions   No medications on file  Previous Medications   ALBUTEROL  (VENTOLIN  HFA) 108 (90 BASE) MCG/ACT INHALER    INHALE 2 PUFFS INTO THE LUNGS FOUR TIMES A DAY   CALCIUM CITRATE (CALCITRATE - DOSED IN MG ELEMENTAL CALCIUM) 950 (200 CA) MG TABLET  Take 200 mg of elemental calcium by mouth daily.   CARVEDILOL  (COREG ) 3.125 MG TABLET    Take 1 tablet (3.125 mg total) by mouth 2 (two) times daily.   CHOLECALCIFEROL  (VITAMIN D3) 1.25 MG (50000 UT)  CAPS    Take 1 capsule (50,000 Units total) by mouth once a week.   FUROSEMIDE  (LASIX ) 20 MG TABLET    Take 1 tablet (20 mg total) by mouth daily as needed.   MULTIPLE VITAMIN (MULTIVITAMIN WITH MINERALS) TABS TABLET    Take 2 tablets by mouth daily.   SACUBITRIL -VALSARTAN  (ENTRESTO ) 97-103 MG    Take 1 tablet by mouth 2 (two) times daily.   ZOLEDRONIC  ACID (RECLAST ) 5 MG/100ML SOLN INJECTION    Inject 5 mg into the vein once.  Modified Medications   No medications on file  Discontinued Medications   No medications on file     Past Medical History Past Medical History:  Diagnosis Date   Atrial fibrillation (HCC)    Breast cancer (HCC) 1990/1997    left mastectomy ,had recurrence in left suprclavic nodes several yrs ago-treated with radiation and chemo   CHF (congestive heart failure) (HCC)    Colon polyp 2008   Complication of anesthesia    Difficult intubation    expectorated blood 3-4 days after surgery in 2011   Difficult intubation    hypoxia with endoscopy in 2016   GERD (gastroesophageal reflux disease)    History of chemotherapy    5-FU/AC in 1990 under the care of Dr. Wilder. pt also tx with tamoxifen and arimidex   History of radiation therapy    radiation treat at [facility]   Hypertension 2008   Personal history of chemotherapy 1990   BREAST CA   Personal history of malignant neoplasm of breast 1990    left mastectomy    Personal history of radiation therapy 1997   BREAST CA   Sinusitis    Sleep apnea    history of, went away after gastric surgery    Past Surgical History Past Surgical History:  Procedure Laterality Date   abdomenoplasty     ABDOMINAL HYSTERECTOMY  1996   COLONOSCOPY  2008, 2015   Dr. Dellie   COLONOSCOPY WITH PROPOFOL  N/A 10/12/2019   Procedure: COLONOSCOPY WITH PROPOFOL ;  Surgeon: Dessa Reyes ORN, MD;  Location: [facility] END[facility];  Service: Endoscopy;  Laterality: N/A;  DR BYRNETT WILL UPDATE H&P ON PROCEDURE DATE   DILATION AND CURETTAGE OF  UTERUS     KNEE ARTHROPLASTY Left 09/07/2017   Procedure: COMPUTER ASSISTED TOTAL KNEE ARTHROPLASTY;  Surgeon: Mardee Lynwood SQUIBB, MD;  Location: [facility] OR[facility];  Service: Orthopedics;  Laterality: Left;   KNEE ARTHROSCOPY Bilateral    KNEE ARTHROSCOPY Right 06/24/2022   Procedure: RIGHT KNEE ARTHROSCOPY;  Surgeon: Sheril Coy, MD;  Location: [facility];  Service: Orthopedics;  Laterality: Right;   KNEE SURGERY Left 2009, 2010, 2012   LAPAROSCOPIC GASTRIC RESTRICTIVE DUODENAL PROCEDURE (DUODENAL SWITCH) N/A 04/17/2015   Procedure: LAPAROSCOPIC GASTRIC RESTRICTIVE DUODENAL PROCEDURE, single anastamosis ;  Surgeon: Thom CHRISTELLA Pin, MD;  Location: [facility] ORS;  Service: General;  Laterality: N/A;   MASTECTOMY Left 1990   BREAST CA   TONSILLECTOMY     UPPER GI ENDOSCOPY N/A 04/17/2015   Procedure: UPPER GI ENDOSCOPY;  Surgeon: Thom CHRISTELLA Pin, MD;  Location: [facility] ORS;  Service: General;  Laterality: N/A;    Family History family history includes Breast cancer (age of onset: 31) in her maternal aunt; CVA in her mother;  Heart failure in her mother; Hyperlipidemia in her mother; Hypertension in her mother; Hypothyroidism in her mother; Kidney disease in her mother.  Social History Social History   Socioeconomic History   Marital status: Widowed    Spouse name: Not on file   Number of children: Not on file   Years of education: Not on file   Highest education level: Not on file  Occupational History   Not on file  Tobacco Use   Smoking status: Never   Smokeless tobacco: Never  Vaping Use   Vaping status: Never Used  Substance and Sexual Activity   Alcohol use: No   Drug use: No   Sexual activity: Not on file  Other Topics Concern   Not on file  Social History Narrative   Not on file   Social Drivers of Health   Financial Resource Strain: Not on file  Food Insecurity: Not on file  Transportation Needs: Not on file  Physical Activity: Not on file  Stress: Not on file  Social Connections: Not on  file  Intimate Partner Violence: Not on file    Laboratory Investigations No components found for: CMP No components found for: BMP No results found for: GFR Lab Results  Component Value Date   CREATININE 0.84 12/11/2023   No results found for: CBC No components found for: LFT No components found for: VITD Lab Results  Component Value Date   PTH 62 12/11/2023   PTH Comment 12/11/2023   Lab Results  Component Value Date   TSH 1.560 06/30/2023   No components found for: RENAL FUNCTION No components found for: MAGNESIUM   Parts of this note may have been dictated using voice recognition software. There may be variances in spelling and vocabulary which are unintentional. Not all errors are proofread. Please notify the dino if any discrepancies are noted or if the meaning of any statement is not clear.

## 2024-07-20 ENCOUNTER — Encounter: Payer: Self-pay | Admitting: "Endocrinology

## 2024-07-21 ENCOUNTER — Other Ambulatory Visit: Payer: Self-pay | Admitting: "Endocrinology

## 2024-07-21 DIAGNOSIS — E213 Hyperparathyroidism, unspecified: Secondary | ICD-10-CM

## 2024-07-23 LAB — VITAMIN D 1,25 DIHYDROXY
Vitamin D 1, 25 (OH)2 Total: 155 pg/mL — ABNORMAL HIGH (ref 18–72)
Vitamin D2 1, 25 (OH)2: <8 pg/mL
Vitamin D3 1, 25 (OH)2: 155 pg/mL

## 2024-07-23 LAB — RENAL FUNCTION PANEL
Albumin: 4.1 g/dL (ref 3.6–5.1)
BUN: 16 mg/dL (ref 7–25)
CO2: 25 mmol/L (ref 20–32)
Calcium: 9.4 mg/dL (ref 8.6–10.4)
Chloride: 108 mmol/L (ref 98–110)
Creat: 0.73 mg/dL (ref 0.50–1.05)
Glucose, Bld: 78 mg/dL (ref 65–99)
Phosphorus: 2.7 mg/dL (ref 2.5–4.5)
Potassium: 4.5 mmol/L (ref 3.5–5.3)
Sodium: 141 mmol/L (ref 135–146)

## 2024-07-23 LAB — VITAMIN D 25 HYDROXY (VIT D DEFICIENCY, FRACTURES): Vit D, 25-Hydroxy: 23 ng/mL — ABNORMAL LOW (ref 30–100)

## 2024-07-23 LAB — PTH, INTACT AND CALCIUM
Calcium: 9.4 mg/dL (ref 8.6–10.4)
PTH: 150 pg/mL — ABNORMAL HIGH (ref 16–77)

## 2024-07-26 ENCOUNTER — Encounter: Payer: Self-pay | Admitting: "Endocrinology

## 2024-07-27 NOTE — Telephone Encounter (Signed)
 Spoke to pt and informed her that unfortunately the lab order for 24 hr urine does not allow us  to change location to [facility]. And she will need to come to Dr Dartha office to the [place] lab to do the lab.

## 2024-08-03 ENCOUNTER — Other Ambulatory Visit

## 2024-08-03 ENCOUNTER — Other Ambulatory Visit: Payer: Self-pay

## 2024-08-03 DIAGNOSIS — E213 Hyperparathyroidism, unspecified: Secondary | ICD-10-CM

## 2024-08-09 ENCOUNTER — Other Ambulatory Visit

## 2024-08-09 DIAGNOSIS — E213 Hyperparathyroidism, unspecified: Secondary | ICD-10-CM | POA: Diagnosis not present

## 2024-08-10 LAB — CALCIUM, 24-HOUR URINE WITH CREATININE
CALCIUM/CREATININE RATIO: 24 mg/g{creat} — ABNORMAL LOW (ref 30–275)
Calcium, 24H Urine: 32 mg/(24.h) — ABNORMAL LOW
Creatinine, 24H Ur: 1.34 g/(24.h) (ref 0.50–2.15)

## 2024-08-15 ENCOUNTER — Encounter: Payer: Self-pay | Admitting: "Endocrinology

## 2024-08-15 ENCOUNTER — Telehealth (INDEPENDENT_AMBULATORY_CARE_PROVIDER_SITE_OTHER): Admitting: "Endocrinology

## 2024-08-15 VITALS — Ht 66.5 in | Wt 228.0 lb

## 2024-08-15 DIAGNOSIS — E210 Primary hyperparathyroidism: Secondary | ICD-10-CM | POA: Diagnosis not present

## 2024-08-15 DIAGNOSIS — E559 Vitamin D deficiency, unspecified: Secondary | ICD-10-CM | POA: Diagnosis not present

## 2024-08-15 NOTE — Progress Notes (Signed)
 The patient reports they are currently: Mandy Roberts. I spent 14-15 minutes on the video with the patient on the date of service. I spent an additional 5 minutes on pre- and post-visit activities on the date of service.   The patient was physically located in [place]  or a state in which I am permitted to provide care. The patient and/or parent/guardian understood that s/he may incur co-pays and cost sharing, and agreed to the telemedicine visit. The visit was reasonable and appropriate under the circumstances given the patient's presentation at the time.  The patient and/or parent/guardian has been advised of the potential risks and limitations of this mode of treatment (including, but not limited to, the absence of in-person examination) and has agreed to be treated using telemedicine. The patient's/patient's family's questions regarding telemedicine have been answered.   The patient and/or parent/guardian has also been advised to contact their provider's office for worsening conditions, and seek emergency medical treatment and/or call 911 if the patient deems either necessary.      [facility] Note Mandy Birmingham, MD    Referring Provider: Fernand Fredy RAMAN, MD Primary Care Provider: Fernand Fredy RAMAN, MD No chief complaint on file.  Assessment & Plan  Diagnoses and all orders for this visit:  Primary hyperparathyroidism  Vitamin D  deficiency   Likely secondary cause from age.  BMD results suggest: T-score -3.5 L-spine. History of duodenal switch. Recommend to use calcium citrate 500 mg twice daily OTC and vitamin D  1000 units Vit D daily. Continue weight bearing exercise options and dietary supplements. History of duodenal switch  S/p reclast  in 01/2024-well tolerated  Not a candidate of forteo due to history of recurrent breast cancer Educated on risks and side effects of reclast  including atypical femoral fractures and osteonecrosis of the jaw. Patient understands and would  like advance with reclast  treatment post Vit D improvement  Continue to follow fall precautions, adequate dairy in diet and exercises (aerobic, balancing and weight bearing) as tolerated.   Vit D deficiency Low 25 Vit D with high 1,25 Vit D Recommend vitamin D  1000 units Vit D daily  Hyperparathyroidism with mildly elevated PTH at 69 and elevated 1,25 D at 99.6 Phosphorus is normal and calcium is low normal 12/11/23: Repeat PTH WNL after Vit D supplementation  07/19/24: PTH elevated  07/2024: 24 hr urine Ca low at 32 Continue monitoring   Return in about 5 months (around 01/15/2025) for visit + labs before next visit.  I have reviewed current medications, nurse's notes, allergies, vital signs, past medical and surgical history, family medical history, and social history for this encounter. Counseled patient on symptoms, examination findings, lab findings, imaging results, treatment decisions and monitoring and prognosis. The patient understood the recommendations and agrees with the treatment plan. All questions regarding treatment plan were fully answered.   Mandy Birmingham, MD   08/15/24    History of Present Illness Mandy Roberts is a 63 y.o. year old female who presents to our clinic with osteoporosis diagnosed in 2021. BMD completed on 07/2023 suggested osteoporosis.   C/o tired and not able to function No recent falls/fractures Currently taking Calcium citrate 500 mg qd  Stopped vitamin D  50,000 international units once weekly. Has appt. with GI Calcium citrate 500 mg twice daily OTC, skips it some does due to stomach intolerance On MVI, not sperate Vit D  Continues to have usual bone pains   Initial history:  Has chemotherapy in 1990 and radiation in 1997 for breast  cancer. Couldn't tolerate fosamax due to body pains. Has duodenal switch in 2016. S/p reclast  in 01/2024-well tolerated     Risk Factors screening:  History of low trauma osteoporotic fractures: No Family  history of osteoporosis: Yes Hip fracture in first-degree relatives: No Smoking history: No Excessive alcohol intake >2 drinks/day: No Excessive caffeine intake >2 drinks/day: Yes Glucocorticoid use >5mg  prednisone /day for >3 months: No Rheumatoid arthritis history: No Premature/Surgical Menopause: No  Physical Exam  Ht 5' 6.5 (1.689 m)   Wt 228 lb (103.4 kg)   LMP  (LMP Unknown)   BMI 36.25 kg/m  Constitutional: well developed, well nourished Head: normocephalic, atraumatic Eyes: sclera anicteric, no redness Neck: supple Lungs: normal respiratory effort Neurology: alert and oriented Skin: dry, no appreciable rashes Musculoskeletal: no appreciable defects Psychiatric: normal mood and affect  Allergies Allergies  Allergen Reactions   Tdap [Tetanus-Diphth-Acell Pertussis] Shortness Of Breath    Current Medications Patient's Medications  New Prescriptions   No medications on file  Previous Medications   ALBUTEROL  (VENTOLIN  HFA) 108 (90 BASE) MCG/ACT INHALER    INHALE 2 PUFFS INTO THE LUNGS FOUR TIMES A DAY   CALCIUM CITRATE (CALCITRATE - DOSED IN MG ELEMENTAL CALCIUM) 950 (200 CA) MG TABLET    Take 200 mg of elemental calcium by mouth daily.   CARVEDILOL  (COREG ) 3.125 MG TABLET    Take 1 tablet (3.125 mg total) by mouth 2 (two) times daily.   CHOLECALCIFEROL  (VITAMIN D3) 1.25 MG (50000 UT) CAPS    Take 1 capsule (50,000 Units total) by mouth once a week.   FUROSEMIDE  (LASIX ) 20 MG TABLET    Take 1 tablet (20 mg total) by mouth daily as needed.   MULTIPLE VITAMIN (MULTIVITAMIN WITH MINERALS) TABS TABLET    Take 2 tablets by mouth daily.   SACUBITRIL -VALSARTAN  (ENTRESTO ) 97-103 MG    Take 1 tablet by mouth 2 (two) times daily.   ZOLEDRONIC  ACID (RECLAST ) 5 MG/100ML SOLN INJECTION    Inject 5 mg into the vein once.  Modified Medications   No medications on file  Discontinued Medications   No medications on file     Past Medical History Past Medical History:  Diagnosis  Date   Atrial fibrillation (HCC)    Breast cancer (HCC) 1990/1997    left mastectomy ,had recurrence in left suprclavic nodes several yrs ago-treated with radiation and chemo   CHF (congestive heart failure) (HCC)    Colon polyp 2008   Complication of anesthesia    Difficult intubation    expectorated blood 3-4 days after surgery in 2011   Difficult intubation    hypoxia with endoscopy in 2016   GERD (gastroesophageal reflux disease)    History of chemotherapy    5-FU/AC in 1990 under the care of Dr. Wilder. pt also tx with tamoxifen and arimidex   History of radiation therapy    radiation treat at [facility]   Hypertension 2008   Personal history of chemotherapy 1990   BREAST CA   Personal history of malignant neoplasm of breast 1990    left mastectomy    Personal history of radiation therapy 1997   BREAST CA   Sinusitis    Sleep apnea    history of, went away after gastric surgery    Past Surgical History Past Surgical History:  Procedure Laterality Date   abdomenoplasty     ABDOMINAL HYSTERECTOMY  1996   COLONOSCOPY  2008, 2015   Dr. Dellie   COLONOSCOPY WITH PROPOFOL  N/A 10/12/2019  Procedure: COLONOSCOPY WITH PROPOFOL ;  Surgeon: Dessa Reyes ORN, MD;  Location: [facility];  Service: Endoscopy;  Laterality: N/A;  DR BYRNETT WILL UPDATE H&P ON PROCEDURE DATE   DILATION AND CURETTAGE OF UTERUS     KNEE ARTHROPLASTY Left 09/07/2017   Procedure: COMPUTER ASSISTED TOTAL KNEE ARTHROPLASTY;  Surgeon: Mardee Lynwood SQUIBB, MD;  Location: [facility];  Service: Orthopedics;  Laterality: Left;   KNEE ARTHROSCOPY Bilateral    KNEE ARTHROSCOPY Right 06/24/2022   Procedure: RIGHT KNEE ARTHROSCOPY;  Surgeon: Sheril Coy, MD;  Location: [facility];  Service: Orthopedics;  Laterality: Right;   KNEE SURGERY Left 2009, 2010, 2012   LAPAROSCOPIC GASTRIC RESTRICTIVE DUODENAL PROCEDURE (DUODENAL SWITCH) N/A 04/17/2015   Procedure: LAPAROSCOPIC GASTRIC RESTRICTIVE DUODENAL PROCEDURE, single  anastamosis ;  Surgeon: Thom CHRISTELLA Pin, MD;  Location: [facility] ORS;  Service: General;  Laterality: N/A;   MASTECTOMY Left 1990   BREAST CA   TONSILLECTOMY     UPPER GI ENDOSCOPY N/A 04/17/2015   Procedure: UPPER GI ENDOSCOPY;  Surgeon: Thom CHRISTELLA Pin, MD;  Location: [facility] ORS;  Service: General;  Laterality: N/A;    Family History family history includes Breast cancer (age of onset: 58) in her maternal aunt; CVA in her mother; Heart failure in her mother; Hyperlipidemia in her mother; Hypertension in her mother; Hypothyroidism in her mother; Kidney disease in her mother.  Social History Social History   Socioeconomic History   Marital status: Widowed    Spouse name: Not on file   Number of children: Not on file   Years of education: Not on file   Highest education level: Not on file  Occupational History   Not on file  Tobacco Use   Smoking status: Never   Smokeless tobacco: Never  Vaping Use   Vaping status: Never Used  Substance and Sexual Activity   Alcohol use: No   Drug use: No   Sexual activity: Not on file  Other Topics Concern   Not on file  Social History Narrative   Not on file   Social Drivers of Health   Financial Resource Strain: Not on file  Food Insecurity: Not on file  Transportation Needs: Not on file  Physical Activity: Not on file  Stress: Not on file  Social Connections: Not on file  Intimate Partner Violence: Not on file    Laboratory Investigations No components found for: CMP No components found for: BMP No results found for: GFR Lab Results  Component Value Date   CREATININE 0.73 07/19/2024   No results found for: CBC No components found for: LFT No components found for: VITD Lab Results  Component Value Date   PTH 150 (H) 07/19/2024   Lab Results  Component Value Date   TSH 1.560 06/30/2023   No components found for: RENAL FUNCTION No components found for: MAGNESIUM   Parts of this note may have been dictated using  voice recognition software. There may be variances in spelling and vocabulary which are unintentional. Not all errors are proofread. Please notify the dino if any discrepancies are noted or if the meaning of any statement is not clear.

## 2024-09-08 ENCOUNTER — Other Ambulatory Visit: Payer: Self-pay

## 2024-09-08 ENCOUNTER — Encounter: Payer: Self-pay | Admitting: Gastroenterology

## 2024-09-08 ENCOUNTER — Ambulatory Visit: Admitting: Gastroenterology

## 2024-09-08 VITALS — BP 110/74 | HR 76 | Ht 66.0 in | Wt 227.0 lb

## 2024-09-08 DIAGNOSIS — Z860100 Personal history of colon polyps, unspecified: Secondary | ICD-10-CM | POA: Diagnosis not present

## 2024-09-08 DIAGNOSIS — R140 Abdominal distension (gaseous): Secondary | ICD-10-CM | POA: Diagnosis not present

## 2024-09-08 DIAGNOSIS — R143 Flatulence: Secondary | ICD-10-CM

## 2024-09-08 MED ORDER — METRONIDAZOLE 500 MG PO TABS
500.0000 mg | ORAL_TABLET | Freq: Two times a day (BID) | ORAL | 0 refills | Status: AC
  Filled 2024-09-08: qty 20, 10d supply, fill #0

## 2024-09-08 MED ORDER — NA SULFATE-K SULFATE-MG SULF 17.5-3.13-1.6 GM/177ML PO SOLN
1.0000 | Freq: Once | ORAL | 0 refills | Status: AC
  Filled 2024-09-08: qty 354, 1d supply, fill #0

## 2024-09-08 NOTE — Progress Notes (Addendum)
 [facility] Consult Note:  History: Mandy Roberts 09/08/2024  Referring provider: Fernand Fredy RAMAN, MD  Reason for consult/chief complaint: Colon Cancer Screening (Patient is here to discus scheduling he colonoscopy. Hx of colon polyps)   Subjective  Prior history:  Colonoscopy at [facility] by Dr. Reyes Cota in November 2015 for history of colon polyps, no details of that polyp history in the report.  Complete exam to cecum, no photographs.  Withdrawal time 3 minutes 17 seconds, 5-year recall recommended  Colonoscopy at [facility] by same provider in November 2024 history of colon polyps, again no details of polyp history in that report.  Complete to cecum with photographs, reported excellent prep, about 20 minutes of scope insertion time from redundant and tortuous colon, over 16-minute withdrawal time.  Rectal polypoid lesion reportedly due to suction.  History of a duodenal switch History of reported difficult intubation with reported hemoptysis for days after previous surgical or endoscopic procedure. Primary care note from January earlier this year indicates patient had an episode of A-fib with spontaneous return to sinus rhythm during ED visit.  Was reportedly referred to cardiology.  If seen by the specialty, no records of that in this EHR.  History of Present Illness My advice is that sheAnnette H Roberts is a 63 year old female who presents with abdominal issues and is due for a colonoscopy. She was referred by her primary doctor for a colonoscopy procedure.  She has been experiencing significant abdominal issues over the past six months, including tightness, excessive gas, and overactive bowel sounds. The sensation is described as if her intestines are twisting, and these symptoms have become disruptive, being audible during phone conversations. Her bowel habits are irregular, with formed stools that are sometimes loose or compacted. She experiences incomplete  evacuation, as indicated by 'smearing' when wiping. No blood in her stools is noted.  Occasional nausea is present, but there is no vomiting. She previously used GLP-1 receptor agonists like Mounjaro  and Zepbound , which she wonders might have contributed to her symptoms. She underwent a switch procedure and abdominoplasty over two years ago, which she suspects may have resulted in scar tissue contributing to her symptoms. She also has a history of a gastric sleeve procedure as part of the switch, performed in 2016. ------------  Mandy Roberts was here to discuss a colonoscopy based on the previous recommendation she had received after procedure as noted above. She also describes at least 6 months of abdominal bloating with increased borborygmi, gas and bloating.  Stools typically formed, she denies rectal bleeding.  Appetite generally good, denies vomiting or dysphagia.  She has not experienced chest pain or palpitations since the episode of A-fib last year.  While she did not see cardiology in formal consultation, she spoke with a [facility] cardiologist who she knows personally, and they apparently did not feel she needed further testing or change of medicines.   ROS:  Review of Systems  Constitutional:  Negative for appetite change and unexpected weight change.  HENT:  Negative for mouth sores and voice change.   Eyes:  Negative for pain and redness.  Respiratory:  Negative for cough and shortness of breath.   Cardiovascular:  Negative for chest pain and palpitations.  Genitourinary:  Negative for dysuria and hematuria.  Musculoskeletal:  Negative for arthralgias and myalgias.  Skin:  Negative for pallor and rash.  Neurological:  Negative for weakness and headaches.  Hematological:  Negative for adenopathy.     Past Medical History: Past  Medical History:  Diagnosis Date   Atrial fibrillation (HCC)    Breast cancer (HCC) 1990/1997    left mastectomy ,had recurrence in left suprclavic nodes  several yrs ago-treated with radiation and chemo   CHF (congestive heart failure) (HCC)    Colon polyp 2008   Complication of anesthesia    Difficult intubation    expectorated blood 3-4 days after surgery in 2011   Difficult intubation    hypoxia with endoscopy in 2016   GERD (gastroesophageal reflux disease)    History of chemotherapy    5-FU/AC in 1990 under the care of Dr. Wilder. pt also tx with tamoxifen and arimidex   History of radiation therapy    radiation treat at [facility]   Hypertension 2008   Personal history of chemotherapy 1990   BREAST CA   Personal history of malignant neoplasm of breast 1990    left mastectomy    Personal history of radiation therapy 1997   BREAST CA   Sinusitis    Sleep apnea    history of, went away after gastric surgery     Past Surgical History: Past Surgical History:  Procedure Laterality Date   abdomenoplasty     ABDOMINAL HYSTERECTOMY  1996   COLONOSCOPY  2008, 2015   Dr. Dellie   COLONOSCOPY WITH PROPOFOL  N/A 10/12/2019   Procedure: COLONOSCOPY WITH PROPOFOL ;  Surgeon: Dessa Reyes ORN, MD;  Location: [facility] END[facility];  Service: Endoscopy;  Laterality: N/A;  DR BYRNETT WILL UPDATE H&P ON PROCEDURE DATE   DILATION AND CURETTAGE OF UTERUS     KNEE ARTHROPLASTY Left 09/07/2017   Procedure: COMPUTER ASSISTED TOTAL KNEE ARTHROPLASTY;  Surgeon: Mardee Lynwood SQUIBB, MD;  Location: [facility] ORS;  Service: Orthopedics;  Laterality: Left;   KNEE ARTHROSCOPY Bilateral    KNEE ARTHROSCOPY Right 06/24/2022   Procedure: RIGHT KNEE ARTHROSCOPY;  Surgeon: Sheril Coy, MD;  Location: [facility] ORS;  Service: Orthopedics;  Laterality: Right;   KNEE SURGERY Left 2009, 2010, 2012   LAPAROSCOPIC GASTRIC RESTRICTIVE DUODENAL PROCEDURE (DUODENAL SWITCH) N/A 04/17/2015   Procedure: LAPAROSCOPIC GASTRIC RESTRICTIVE DUODENAL PROCEDURE, single anastamosis ;  Surgeon: Thom CHRISTELLA Pin, MD;  Location: [facility] ORS;  Service: General;  Laterality: N/A;   MASTECTOMY Left 1990   BREAST  CA   TONSILLECTOMY     UPPER GI ENDOSCOPY N/A 04/17/2015   Procedure: UPPER GI ENDOSCOPY;  Surgeon: Thom CHRISTELLA Pin, MD;  Location: [facility] ORS;  Service: General;  Laterality: N/A;     Family History: Family History  Problem Relation Age of Onset   Heart failure Mother    Hypertension Mother    Hyperlipidemia Mother    Kidney disease Mother    CVA Mother    Hypothyroidism Mother    Breast cancer Maternal Aunt 77    Social History: Social History   Socioeconomic History   Marital status: Widowed    Spouse name: Not on file   Number of children: Not on file   Years of education: Not on file   Highest education level: Not on file  Occupational History   Not on file  Tobacco Use   Smoking status: Never   Smokeless tobacco: Never  Vaping Use   Vaping status: Never Used  Substance and Sexual Activity   Alcohol use: No   Drug use: No   Sexual activity: Not on file  Other Topics Concern   Not on file  Social History Narrative   Not on file   Social Drivers of Health  Financial Resource Strain: Not on file  Food Insecurity: Not on file  Transportation Needs: Not on file  Physical Activity: Not on file  Stress: Not on file  Social Connections: Not on file    Allergies: Allergies  Allergen Reactions   Tdap [Tetanus-Diphth-Acell Pertussis] Shortness Of Breath    Outpatient Meds: Current Outpatient Medications  Medication Sig Dispense Refill   albuterol  (VENTOLIN  HFA) 108 (90 Base) MCG/ACT inhaler INHALE 2 PUFFS INTO THE LUNGS FOUR TIMES A DAY 6.7 g 1   calcium citrate (CALCITRATE - DOSED IN MG ELEMENTAL CALCIUM) 950 (200 Ca) MG tablet Take 200 mg of elemental calcium by mouth daily.     Cholecalciferol  (VITAMIN D3) 1.25 MG (50000 UT) CAPS Take 1 capsule (50,000 Units total) by mouth once a week. 12 capsule 3   furosemide  (LASIX ) 20 MG tablet Take 1 tablet (20 mg total) by mouth daily as needed. 10 tablet 0   metroNIDAZOLE  (FLAGYL ) 500 MG tablet Take 1 tablet (500 mg  total) by mouth 2 (two) times daily for 10 days. 20 tablet 0   Multiple Vitamin (MULTIVITAMIN WITH MINERALS) TABS tablet Take 2 tablets by mouth daily.     Na Sulfate-K Sulfate-Mg Sulfate concentrate (SUPREP) 17.5-3.13-1.6 GM/177ML SOLN Take 1 kit (354 mLs total) by mouth once for 1 dose. 354 mL 0   carvedilol  (COREG ) 3.125 MG tablet Take 1 tablet (3.125 mg total) by mouth 2 (two) times daily. (Patient not taking: Reported on 09/08/2024) 180 tablet 3   No current facility-administered medications for this visit.      ___________________________________________________________________ Objective   Exam:  BP 110/74 (BP Location: Left Arm, Patient Position: Standing, Cuff Size: Large)   Pulse 76   Ht 5' 6 (1.676 m)   Wt 227 lb (103 kg)   LMP  (LMP Unknown)   BMI 36.64 kg/m  Wt Readings from Last 3 Encounters:  09/08/24 227 lb (103 kg)  08/15/24 228 lb (103.4 kg)  07/19/24 231 lb (104.8 kg)    General: Well-appearing Eyes: sclera anicteric, no redness ENT: oral mucosa moist without lesions, no cervical or supraclavicular lymphadenopathy CV: Regular without appreciable murmur, no JVD, no peripheral edema Resp: clear to auscultation bilaterally, normal RR and effort noted GI: soft, no tenderness, with active bowel sounds. No guarding or palpable organomegaly noted. Skin; warm and dry, no rash or jaundice noted Neuro: awake, alert and oriented x 3. Normal gross motor function and fluent speech   Data:  Procedure reports as noted above  Encounter Diagnoses  Name Primary?   Hx of colonic polyps Yes   Abdominal bloating    Flatulence    Assessment and plan:  Tilla reportedly has a history of colon polyps based on her previous colonoscopy reports.  However, she is confident that while she did have an EGD prior to her bariatric surgery, she had not had a colonoscopy prior to the 2015 exam.  So she cannot account for why that procedure has an indication of history of  polyps. I talked about the findings on those exams and uncertainty about why another 5-year recall was recommended versus 10 years based on current guidelines. There was insufficient withdrawal time and no photographs on the 2015 exam, but both of those things were present on the 2020 report.  It should be noted that was a challenging exam with a reportedly redundant and tortuous colon.  In that situation, there is an increased possibility of missed polyp. Her previous exams were also done by general  surgery.  For those reasons, I recommended she undergo a colonoscopy with me.  She was agreeable and wished to proceed.  I pointed out it must be done in the hospital outpatient endoscopy department due to her history of reported difficult intubation.  In addition, although she had an isolated episode of A-fib and is probably stable from a cardiac standpoint, our anesthesiologist will require that she have a formal cardiology consultation about that.  She says she can easily set that up with her friend/colleague.  Regarding the chronic digestive symptoms of bloating gas and borborygmi, while this may certainly be some element of maldigestion related to her prior bariatric surgery, she also could have SIBO for the same reason.    Plan:  Colonoscopy scheduled in the [facility] endoscopy lab, next available slot in December.  She will see cardiology in the interim  Some gas and bloating related dietary advice was put into her AVS today  She was agreeable to an empiric 10-day course of metronidazole  500 mg twice daily for the possibility of SIBO.  Breath testing would not be accurate given her altered GI anatomy, and rifaximin may not be effective for this indication with this particular GI anatomy.  Thank you for the courtesy of this consult.  Please call me with any questions or concerns.  Victory LITTIE Brand III  CC: Referring provider noted above

## 2024-09-08 NOTE — Patient Instructions (Addendum)
 _______________________________________________________  Food Guidelines for those with chronic digestive trouble:  Many people have difficulty digesting certain foods, causing a variety of distressing and embarrassing symptoms such as abdominal pain, bloating and gas.  These foods may need to be avoided or consumed in small amounts.  Here are some tips that might be helpful for you.  1.   Lactose intolerance is the difficulty or complete inability to digest lactose, the natural sugar in milk and anything made from milk.  This condition is harmless, common, and can begin any time during life.  Some people can digest a modest amount of lactose while others cannot tolerate any.  Also, not all dairy products contain equal amounts of lactose.  For example, hard cheeses such as parmesan have less lactose than soft cheeses such as cheddar.  Yogurt has less lactose than milk or cheese.  Many packaged foods (even many brands of bread) have milk, so read ingredient lists carefully.  It is difficult to test for lactose intolerance, so just try avoiding lactose as much as possible for a week and see what happens with your symptoms.  If you seem to be lactose intolerant, the best plan is to avoid it (but make sure you get calcium from another source).  The next best thing is to use lactase enzyme supplements, available over the counter everywhere.  Just know that many lactose intolerant people need to take several tablets with each serving of dairy to avoid symptoms.  Lastly, a lot of restaurant food is made with milk or butter.  Many are things you might not suspect, such as mashed potatoes, rice and pasta (cooked with butter) and grilled items.  If you are lactose intolerant, it never hurts to ask your server what has milk or butter.  2.   Fiber is an important part of your diet, but not all fiber is well-tolerated.  Insoluble fiber such as bran is often consumed by normal gut bacteria and converted into gas.   Soluble fiber such as oats, squash, carrots and green beans are typically tolerated better.  3.   Some types of carbohydrates can be poorly digested.  Examples include: fructose (apples, cherries, pears, raisins and other dried fruits), fructans (onions, zucchini, large amounts of wheat), sorbitol/mannitol/xylitol and sucralose/Splenda (common artificial sweeteners), and raffinose (lentils, broccoli, cabbage, asparagus, brussel sprouts, many types of beans).  Do a Programmer, multimedia for [employer] [place] and you will find helpful information. Beano, a dietary supplement, will often help with raffinose-containing foods.  As with lactase tablets, you may need several per serving.  4.   Whenever possible, avoid processed food&meats and chemical additives.  High fructose corn syrup, a common sweetener, may be difficult to digest.  Eggs and soy (comes from the soybean, and added to many foods now) are other common bloating/gassy foods.  5.  Regarding gluten:  gluten is a protein mainly found in wheat, but also rye and barley.  There is a condition called celiac sprue, which is an inflammatory reaction in the small intestine causing a variety of digestive symptoms.  Blood testing is highly reliable to look for this condition, and sometimes upper endoscopy with small bowel biopsies may be necessary to make the diagnosis.  Many patients who test negative for celiac sprue report improvement in their digestive symptoms when they switch to a gluten-free diet.  However, in these non-celiac gluten sensitive patients, the true role of gluten in their symptoms is unclear.  Reducing carbohydrates in general may decrease the gas  and bloating caused when gut bacteria consume carbs. Also, some of these patients may actually be intolerant of the baker's yeast in bread products rather than the gluten.  Flatbread and other reduced yeast breads might therefore be tolerated.  There is no specific testing available for most food intolerances,  which are discovered mainly by dietary elimination.  Please do not embark on a gluten free diet unless directed by your doctor, as it is highly restrictive, and may lead to nutritional deficiencies if not carefully monitored.  Lastly, beware of internet claims offering personalized tests for food intolerances.  Such testing has no reliable scientific evidence to support its reliability and correlation to symptoms.    6.  The best advice is old advice, especially for those with chronic digestive trouble - try to eat clean.  Balanced diet, avoid processed food, plenty of fruits and vegetables, cut down the sugar, minimal alcohol, avoid tobacco. Make time to care for yourself, get enough sleep, exercise when you can, reduce stress.  Your guts will thank you for it.   - Dr. Victory Legrand [facility]  ____________________________________________________________      _____________ We have sent the following medications to your pharmacy for you to pick up at your convenience: suprep and flagyl  You have been scheduled for a colonoscopy. Please follow written instructions given to you at your visit today.   If you use inhalers (even only as needed), please bring them with you on the day of your procedure.  DO NOT TAKE 7 DAYS PRIOR TO TEST- Trulicity (dulaglutide) Ozempic , Wegovy  (semaglutide ) Mounjaro  (tirzepatide ) Bydureon Bcise (exanatide extended release)  DO NOT TAKE 1 DAY PRIOR TO YOUR TEST Rybelsus  (semaglutide ) Adlyxin (lixisenatide) Victoza (liraglutide ) Byetta (exanatide) ___________________________________________________________________________   _______________________________________________________  If your blood pressure at your visit was 140/90 or greater, please contact your primary care physician to follow up on this.  _______________________________________________________  If you are age 63 or older, your body mass index should be between 23-30.  Your Body mass index is 36.64 kg/m. If this is out of the aforementioned range listed, please consider follow up with your Primary Care Provider.  If you are age 73 or younger, your body mass index should be between 19-25. Your Body mass index is 36.64 kg/m. If this is out of the aformentioned range listed, please consider follow up with your Primary Care Provider.   ________________________________________________________  The [facility] providers would like to encourage you to use MYCHART to communicate with providers for non-urgent requests or questions.  Due to long hold times on the telephone, sending your provider a message by [facility] may be a faster and more efficient way to get a response.  Please allow 48 business hours for a response.  Please remember that this is for non-urgent requests.  _______________________________________________________  [facility] is using a team-based approach to care.  Your team is made up of your doctor and two to three APPS. Our APPS (Nurse Practitioners and Physician Assistants) work with your physician to ensure care continuity for you. They are fully qualified to address your health concerns and develop a treatment plan. They communicate directly with your gastroenterologist to care for you. Seeing the Advanced Practice Practitioners on your physician's team can help you by facilitating care more promptly, often allowing for earlier appointments, access to diagnostic testing, procedures, and other specialty referrals.    Thank you for trusting me with your gastrointestinal care!    Dr. Victory Legrand [facility]

## 2024-09-18 ENCOUNTER — Other Ambulatory Visit: Payer: Self-pay

## 2024-09-26 ENCOUNTER — Ambulatory Visit: Admitting: "Endocrinology

## 2024-09-27 NOTE — Unmapped External Note (Signed)
 Assessment Note   Demographics Verification - Call Monitoring - Confidentiality      Member Verification: Member Verification    Call Monitoring Disclaimer: Call Monitoring Disclaimer         Person Providing Info on the Call   Who is the person providing the information on the call? Member      Limitations/Preferences   What, if any, physical limitations, health literacy, language needs and learning preferences do you have that I should be aware of when we talk? Physical- No Limitations, Health Literacy- No Limitations, Language- No Limitations, Learning Preferences- No Preference      Specify other language limitations        Specify other physical limitations        LCC Contact Type   Select the Health Your Way contact type: 1st contact-Telephonic Lifestyle Coaching           Lifestyle Coaching Weight Management Initial                    General Health Perception   How would you describe your health in general? My health is good      SMART GOAL & SMART GOAL Follow Up     SMART GOAL    Did the member set a SMART goal?    Did the member meet their previous SMART goal?         Brief Summary of Member Status   What medical conditions has your doctor diagnosed you with? Osteoarthritis, Weight Management- 18 yrs or older  Past Medical History:  Diagnosis Date  . Hyperthyroidism   . Obesity   . Obstructive sleep apnea   . Osteopenia   . Osteoporosis   . Thrombocytopenia      Did the member score via Care Engine for any condition(s) that he/she did NOT confirm? None  HYW:  What medical conditions are you most concerned about and why? Lifestyle-Weight Management Lifestyle-Stress Management  In between MD visits people often find a need to go to the ER or an Urgent Care facility.can you tell me if you had any concerns or issues in which you went to an ER or Urgent care in the last 12 months?      Condition Specific Metrics    Height/Weight/BMI  / /    Blood Pressure    Outcome Metrics      Medications   Current Medications[1]    There are no discontinued medications.     Medication Review         Depression Screening PHQ2/PHQ9 Exclusions   PHQ2 EXCLUSION CRITERIA:  Do NOT administer the PHQ-2 if any of the following apply to this member.   PHQ9 EXCLUSION CRITERIA: To determine if this assessment is right for you, I need to ask you some questions before we start.  Besides depression, have you been diagnosed with any other mental health issues such as: No applicable exclusions              Depression PHQ2/PHQ9 Screening Results     PHQ2  The PHQ-2 questions are scored from 0 (not at all) to 3 (nearly every day) and then added together: Score 0-2 = Not likely to be at risk for depression, Score 3-6 = At risk for depression (further screening recommended).        PHQ9 PHQ-9 Mini Module-If diagnosis of depression- show confirmed dx of depression and show PHQ-9 score & score description  Social Drivers of Health   Tobacco Use: Low Risk  (09/08/2024)   Received from [facility]   Patient History   . Smoking Tobacco Use: Never   . Smokeless Tobacco Use: Never      Lab Results   Results     There are no results available from this visit.         Immunizations   Immunizations Mini Module (show all Q/A pairs answered during this encounter)       Intervention/Actions   Education Topics Discussed      Referrals & Referral Follow Up   Referrals    Flowsheet Row Most Recent Value  LCC - Lifestyle coaching   LCC - Lifestyle Coaching - Did member adhere to referral recommendation? Yes  Please document any referrals to other programs or resources that were made by the staff: None During This Contact         MEP/Mobile App Registration & Education on Tools/Resources   Is the member registered on the Member Engagement Platform (MEP) or Mobile  App?  Explain tools and resources available on Member Engagement Platform [facility]) and how to get the most benefit out of them.  Indicate resources reviewed with member: Yes-registered on [facility] 06/21/2024    Messaging with coach Rewards Center (if applicable) Online resources (videos, recipes, etc.)    Next Scheduled Appointment      Date Provider Department Visit Type   10/25/2024 5:00 PM Sharman Holmes Care Management [facility] Coach Follow Up Assmt- 1st Attempt         Follow Up Needs Identified   HISTORY/BACKGROUND: Health is good. Left TKR, right arthritis. Hyper parathyroid due to low vitamin D , supplements calcium and Vit D. 66" 228lb. Has gained some weight recently, has gone on 5 cruises over the past 6 months. Is supposed to go again on 11/10, but its to [place] so is unsure if it will be canceled. After this next cruise, will be home until the new year.   No regular exercise. Very busy at work and travels. Lots of stressors going on. Hasn't even been thinking about it. Sick family mbr, in hospital, had another stroke and now cant speak. Is POA for this family member. Has two brothers also dealing with health issues. One just dx cancer, just had pet scan today.   Mbr has a lot going on but still wants to fit 3 calls in for intcentive.  EDUCATION PROVIDED: Discussed box breathing and how to practice. Setting realistic goals, maintaining/not gaining weight over the holidays vs putting pressure on self to lose  MEMBER SMART GOALS: no goal set on IA  S -        M -        A -         R -         T -           PROGRESS TOWARDS GOAL(S): NA STRENGTHS:  BARRIERS:  MOTIVATION:    DEPRESSION: assess on FU   MH QUESTIONS: assess on FU   EDUCATION OFFERED/SENT: intro letter, HW content stress management   REFERRALS: none  POC/FOLLOW UP NEEDS: Stress mgmt., exercise?         [1]  Current Outpatient Medications:  .  cholecalciferol , vitamin D3, (DECARA) 1,250 mcg  (50,000 unit) capsule, Take 50,000 Units by mouth, Disp: , Rfl:

## 2024-10-14 DIAGNOSIS — N2581 Secondary hyperparathyroidism of renal origin: Secondary | ICD-10-CM | POA: Diagnosis not present

## 2024-10-14 DIAGNOSIS — H18423 Band keratopathy, bilateral: Secondary | ICD-10-CM | POA: Diagnosis not present

## 2024-10-14 DIAGNOSIS — H2513 Age-related nuclear cataract, bilateral: Secondary | ICD-10-CM | POA: Diagnosis not present

## 2024-10-14 DIAGNOSIS — H04123 Dry eye syndrome of bilateral lacrimal glands: Secondary | ICD-10-CM | POA: Diagnosis not present

## 2024-10-24 ENCOUNTER — Encounter (HOSPITAL_COMMUNITY): Payer: Self-pay | Admitting: Gastroenterology

## 2024-10-24 ENCOUNTER — Telehealth: Payer: Self-pay | Admitting: Gastroenterology

## 2024-10-24 ENCOUNTER — Other Ambulatory Visit: Payer: Self-pay

## 2024-10-24 MED ORDER — NA SULFATE-K SULFATE-MG SULF 17.5-3.13-1.6 GM/177ML PO SOLN
1.0000 | Freq: Once | ORAL | 0 refills | Status: AC
  Filled 2024-10-24: qty 354, 1d supply, fill #0

## 2024-10-24 NOTE — Progress Notes (Signed)
 Attempted to obtain medical history for pre op call via telephone, unable to reach at this time. HIPAA compliant voicemail message left requesting return call to pre surgical testing department.

## 2024-10-24 NOTE — Telephone Encounter (Addendum)
 Procedure:Colonoscopy Procedure date: 10/31/24 Procedure location: [facility] Arrival Time: 9:45 am Spoke with the patient Y/N: Yes Any prep concerns? No  Has the patient obtained the prep from the pharmacy ? No, prep resent to pharmacy Do you have a care partner and transportation: Yes Any additional concerns? No

## 2024-10-25 ENCOUNTER — Telehealth: Payer: Self-pay

## 2024-10-25 DIAGNOSIS — I4891 Unspecified atrial fibrillation: Secondary | ICD-10-CM

## 2024-10-25 NOTE — Telephone Encounter (Signed)
-----   Message from [facility] S sent at 10/25/2024  3:56 PM EST ----- Patient has been advised  to follow up with her Cardiologist. Not sure if she has seen them recently. Kindly get clearance from their office. ----- Message ----- From: Alwin Corean NOVAK, CMA Sent: 10/25/2024   2:18 PM EST To: Fredy GORMAN Bathe, MD

## 2024-10-25 NOTE — Progress Notes (Signed)
 Pre op call Mandy Roberts    PCP-Kohen Mandy Cardiologist-n/a Pulmonologist-n/a  EKG-11/09/23 [name] Cath-n/a Stress-2014 ICD/PM-n/a GLP1-n/a- not on  Blood Thinner-n/a  History: CHF, HTN, BRCA, Afib, OSA, Difficult Intubation. Patient last saw GI doc 10/16 and they noted that earlier in the year she had a visit with her pcp where she addressed having some bouts of afib and they recommended her see a cardiologist. Per pt she spoke with her pcp and due to the afib being short/unsustained didn't need further testing. She says she's had 2-3 episodes in 3-4 years only happeneded when a stressful situation occurred with sick family.She does endorse that she will be seeing her pcp on Thurs this week.  Anesthesia Review- Yes- PCP clearance would be helpful, notified GI office via epic chat 12/2/cb  Addendum: Notified GI office about pcp clearance, her pcp Mandy Roberts thought best she get cleared by cardiology. GI office sent clearance request to cardiology and they are seeing her 12/4. Patient notified by GI. Anesthesia pre op team notified to look at clearances and they will put in a separate note as well.

## 2024-10-25 NOTE — Telephone Encounter (Signed)
 Clearance letter faxed and sent via inbox msg to PCP, Fernand Fredy RAMAN, MD.

## 2024-10-25 NOTE — Unmapped External Note (Signed)
 Assessment Note   Demographics Verification - Call Monitoring - Confidentiality      Member Verification: Member Verification    Call Monitoring Disclaimer: Call Monitoring Disclaimer         Person Providing Info on the Call   Who is the person providing the information on the call? Member      Limitations/Preferences   What, if any, physical limitations, health literacy, language needs and learning preferences do you have that I should be aware of when we talk? Physical- No Limitations, Health Literacy- No Limitations, Language- No Limitations, Learning Preferences- No Preference      Specify other language limitations        Specify other physical limitations        LCC Contact Type   Select the Health Your Way contact type: 3rd contact-Telephonic Lifestyle Coaching           Lifestyle Coaching Weight Management Follow Up                    General Health Perception   How would you describe your health in general? My health is good      SMART GOAL & SMART GOAL Follow Up     SMART GOAL    Did the member set a SMART goal? Yes  Did the member meet their previous SMART goal?  No goal set      Brief Summary of Member Status   What medical conditions has your doctor diagnosed you with?    Past Medical History:  Diagnosis Date  . Hyperthyroidism   . Obesity   . Obstructive sleep apnea   . Osteopenia   . Osteoporosis   . Thrombocytopenia      Did the member score via Care Engine for any condition(s) that he/she did NOT confirm?    HYW:  What medical conditions are you most concerned about and why?    In between MD visits people often find a need to go to the ER or an Urgent Care facility.can you tell me if you had any concerns or issues in which you went to an ER or Urgent care in the last 12 months?      Condition Specific Metrics   Height/Weight/BMI  / /    Blood Pressure    Outcome Metrics      Medications   Current  Medications[1]    There are no discontinued medications.     Medication Review         Depression Screening PHQ2/PHQ9 Exclusions   PHQ2 EXCLUSION CRITERIA:  Do NOT administer the PHQ-2 if any of the following apply to this member.   PHQ9 EXCLUSION CRITERIA: To determine if this assessment is right for you, I need to ask you some questions before we start.  Besides depression, have you been diagnosed with any other mental health issues such as: No applicable exclusions              Depression PHQ2/PHQ9 Screening Results     PHQ2  The PHQ-2 questions are scored from 0 (not at all) to 3 (nearly every day) and then added together: Score 0-2 = Not likely to be at risk for depression, Score 3-6 = At risk for depression (further screening recommended).        PHQ9 PHQ-9 Mini Module-If diagnosis of depression- show confirmed dx of depression and show PHQ-9 score & score description        Social Drivers of Health  Tobacco Use: Low Risk  (10/24/2024)   Received from [facility]   Patient History   . Smoking Tobacco Use: Never   . Smokeless Tobacco Use: Never      Lab Results   Results     There are no results available from this visit.         Immunizations   Immunizations Mini Module (show all Q/A pairs answered during this encounter)       Intervention/Actions   Education Topics Discussed      Referrals & Referral Follow Up          MEP/Mobile App Registration & Education on Tools/Resources   Is the member registered on the Member Engagement Platform (MEP) or Mobile App?  Explain tools and resources available on Member Engagement Platform [facility]) and how to get the most benefit out of them.  Indicate resources reviewed with member: Yes-registered on [facility] 06/21/2024    Messaging with coach Rewards Center (if applicable) Online resources (videos, recipes, etc.)    Next Scheduled Appointment      Date Provider Department Visit  Type   01/03/2025 5:00 PM Sharman Holmes Care Management [facility] Coach Follow Up Assmt- 1st Attempt         Follow Up Needs Identified   HISTORY/BACKGROUND: Nothing much has changed. Aunts funeral yesterday, she passed since call last month. Today had to go in and get a temporary crown on. Sched for PCP on Thurs and colonoscopy next week. Did go on the cruise last month and decided to book another one later this month after not having anything planned until next year. Going with her daughter, grandkids and just plans to relax.  EDUCATION PROVIDED: Reinforced box breathing for stress mgmt.SABRA Due mbr for keeping up with all of her own medical appts/preventative care despite so much going on in their family right now  MEMBER SMART GOALS:   S -       Mbr will continue to utilize box breathing as needed for stress relief M -       Mindfulness practice A -        Self, LC R -        7/10 T -        daily until next call in 71mo   PROGRESS TOWARDS GOAL(S): NA STRENGTHS: determined BARRIERS: lots going on with ill family mbrs, holidays, work MOTIVATION: Reduce stress, maintain weight   DEPRESSION: negative   MH QUESTIONS:    EDUCATION OFFERED/SENT: FU letter   REFERRALS: none  POC/FOLLOW UP NEEDS: FU weight over holidays, stress mgmt         [1]  Current Outpatient Medications:  .  cholecalciferol , vitamin D3, (DECARA) 1,250 mcg (50,000 unit) capsule, Take 50,000 Units by mouth, Disp: , Rfl:

## 2024-10-26 NOTE — Telephone Encounter (Signed)
 Spoke with the pt about getting cardiac clearance. I informed pt I was able to get her scheduled for a NP appt in [place] on 10/27/24 @ 2 pm. Pt states understanding but will call Dr Cherrie to see if he is able to squeeze her in. She will hold onto the NP appt just in case Dr Bensimhon is unable to get her in and cancel it is he is able to see her.   Procedure is scheduled 10/31/24.

## 2024-10-26 NOTE — Addendum Note (Signed)
 Addended by: Orla Jolliff B on: 10/26/2024 04:44 PM   Modules accepted: Orders

## 2024-10-27 ENCOUNTER — Ambulatory Visit: Admitting: Family

## 2024-10-27 ENCOUNTER — Other Ambulatory Visit
Admission: RE | Admit: 2024-10-27 | Discharge: 2024-10-27 | Disposition: A | Source: Ambulatory Visit | Attending: Family | Admitting: Family

## 2024-10-27 ENCOUNTER — Encounter: Payer: Self-pay | Admitting: Family

## 2024-10-27 ENCOUNTER — Ambulatory Visit: Admitting: Internal Medicine

## 2024-10-27 VITALS — BP 129/72 | HR 81 | Wt 237.0 lb

## 2024-10-27 DIAGNOSIS — Z9012 Acquired absence of left breast and nipple: Secondary | ICD-10-CM | POA: Diagnosis not present

## 2024-10-27 DIAGNOSIS — I5022 Chronic systolic (congestive) heart failure: Secondary | ICD-10-CM

## 2024-10-27 DIAGNOSIS — I480 Paroxysmal atrial fibrillation: Secondary | ICD-10-CM

## 2024-10-27 DIAGNOSIS — I700 Atherosclerosis of aorta: Secondary | ICD-10-CM | POA: Diagnosis not present

## 2024-10-27 DIAGNOSIS — Z923 Personal history of irradiation: Secondary | ICD-10-CM | POA: Diagnosis not present

## 2024-10-27 DIAGNOSIS — I110 Hypertensive heart disease with heart failure: Secondary | ICD-10-CM | POA: Diagnosis not present

## 2024-10-27 DIAGNOSIS — G4733 Obstructive sleep apnea (adult) (pediatric): Secondary | ICD-10-CM

## 2024-10-27 DIAGNOSIS — I872 Venous insufficiency (chronic) (peripheral): Secondary | ICD-10-CM | POA: Diagnosis not present

## 2024-10-27 DIAGNOSIS — Z853 Personal history of malignant neoplasm of breast: Secondary | ICD-10-CM | POA: Diagnosis not present

## 2024-10-27 DIAGNOSIS — Z9884 Bariatric surgery status: Secondary | ICD-10-CM | POA: Diagnosis not present

## 2024-10-27 DIAGNOSIS — I10 Essential (primary) hypertension: Secondary | ICD-10-CM | POA: Diagnosis not present

## 2024-10-27 LAB — BASIC METABOLIC PANEL WITH GFR
Anion gap: 10 (ref 5–15)
BUN: 16 mg/dL (ref 8–23)
CO2: 25 mmol/L (ref 22–32)
Calcium: 9.3 mg/dL (ref 8.9–10.3)
Chloride: 108 mmol/L (ref 98–111)
Creatinine, Ser: 0.92 mg/dL (ref 0.44–1.00)
GFR, Estimated: >60 mL/min (ref >60)
Glucose, Bld: 77 mg/dL (ref 70–99)
Potassium: 4.7 mmol/L (ref 3.5–5.1)
Sodium: 143 mmol/L (ref 135–145)

## 2024-10-27 NOTE — Telephone Encounter (Signed)
 Clearance received from cards.

## 2024-10-27 NOTE — Patient Instructions (Signed)
 Medication Changes:  No medication changes today!  Lab Work:  Go over to the MEDICAL MALL. Go pass the gift shop and have your blood work completed.   We will only call you if the results are abnormal or if the provider would like to make medication changes.  No news is good news.     Testing/Procedures:  Your physician has requested that you have an echocardiogram. Echocardiography is a painless test that uses sound waves to create images of your heart. It provides your doctor with information about the size and shape of your heart and how well your heart's chambers and valves are working. This procedure takes approximately one hour. There are no restrictions for this procedure. Please do NOT wear cologne, perfume, aftershave, or lotions (deodorant is allowed). Please arrive 15 minutes prior to your appointment time.  Please note: We ask at that you not bring children with you during ultrasound (echo/ vascular) testing. Due to room size and safety concerns, children are not allowed in the ultrasound rooms during exams. Our front office staff cannot provide observation of children in our lobby area while testing is being conducted. An adult accompanying a patient to their appointment will only be allowed in the ultrasound room at the discretion of the ultrasound technician under special circumstances. We apologize for any inconvenience.   Follow-Up in: Please follow up with the [facility] in 4 months with Ellouise Class, FNP.   Thank you for choosing [facility].    At the [facility], you and your health needs are our priority. We have a designated team specialized in the treatment of Heart Failure. This Care Team includes your primary Heart Failure Specialized Cardiologist (physician), Advanced Practice Providers (APPs- Physician Assistants and Nurse Practitioners), and Pharmacist who all work together to provide  you with the care you need, when you need it.   You may see any of the following providers on your designated Care Team at your next follow up:  Dr. Toribio Fuel Dr. Ezra Shuck Dr. Ria Commander Dr. Morene Brownie Ellouise Class, FNP Jaun Bash, RPH-CPP  Please be sure to bring in all your medications bottles to every appointment.   Need to Contact Us :  If you have any questions or concerns before your next appointment please send us  a message through [place] or call our office at [phone].    TO LEAVE A MESSAGE FOR THE NURSE SELECT OPTION 2, PLEASE LEAVE A MESSAGE INCLUDING: YOUR NAME DATE OF BIRTH CALL BACK NUMBER REASON FOR CALL**this is important as we prioritize the call backs  YOU WILL RECEIVE A CALL BACK THE SAME DAY AS LONG AS YOU CALL BEFORE 4:00 PM

## 2024-10-27 NOTE — Progress Notes (Signed)
 ADVANCED HF CLINIC NOTE  Referring Physician:Khan, Neelam Primary Care: Fernand La Primary Cardiologist: Fernand Ludwig  Chief Complaint: HF visit   HPI:  Mandy Roberts is a 63 yo female with PMH of OSA, PAF, Obesity w/hx of gastric bypass, Venous insufficiency, hx of breast cancer 1990 s/p left mastectomy, chemo 5FU/AC, and radiation therapy.    Echocardiogram August 2008 showing ejection fraction 40-45%, G1DD  Prior stress test July 2011 showing attenuation artifact in the anterior wall, ejection fraction 50%. Never had a cath.   EchoJuly 2011 showing ejection fraction 45-50%, moderate LVH  08/2013 saw Dr. Gollan outpatient for palpitations.  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  She was started on metoprolol  and eliquis .  Origin was thought to be severe OSA and possibly a component of diastolic CHF.    Tested positive for covid 19 on 04/14/21.  Had an ECHO 05/23/21 with EF 44%, mildly dilated hypokinetic septal wall, G1DD, mildly dilated RV.   Had abdominoplasty in 7/22. Took 8.5 pounds of skin. No problem  Echo 10/04/21  EF 45-50% Personally reviewed by Dr. Cherrie.   Arthroscopy of right knee 08/23 without complication.   Was in the ED 11/08/23 with palpitations. EKG was NSR so PAF was suspected.   Last seen in [place] 11/22 and returns today needing clearance for upcoming colonoscopy. She presents with a chief complaint of a HF visit. Denies any shortness of breath, fatigue, chest pain, palpitations, dizziness, difficulty sleeping. Notes occasional edema when she's flying but hasn't taken her lasix  in the last month. Having colonoscopy done next week and is needing clearance for this.   No longer taking any betablockers and feels like both times she's noted a fib, where times where she was under a lot of stress. Overall, she reports feeling well. Continues to work  in the ED at [employer] and goes on a cruise once / month.    Past Medical History:  Diagnosis Date   Atrial fibrillation (HCC)    Breast cancer [facility]) 1990/1997    left mastectomy ,had recurrence in left suprclavic nodes several yrs ago-treated with radiation and chemo   CHF (congestive heart failure) (HCC)    Colon polyp 2008   Complication of anesthesia    Difficult intubation    expectorated blood 3-4 days after surgery in 2011   Difficult intubation    hypoxia with endoscopy in 2016   GERD (gastroesophageal reflux disease)    History of chemotherapy    5-FU/AC in 1990 under the care of Dr. Wilder. pt also tx with tamoxifen and arimidex   History of radiation therapy    radiation treat at [facility]   Hypertension 2008   Personal history of chemotherapy 1990   BREAST CA   Personal history of malignant neoplasm of breast 1990    left mastectomy    Personal history of radiation therapy 1997   BREAST CA   Sinusitis    Sleep apnea    history of, went away after gastric surgery    Current Outpatient Medications  Medication Sig Dispense Refill   albuterol  (VENTOLIN  HFA) 108 (90 Base) MCG/ACT inhaler INHALE 2 PUFFS INTO THE LUNGS FOUR TIMES A DAY 6.7 g 1   calcium citrate (CALCITRATE - DOSED IN MG ELEMENTAL CALCIUM) 950 (200 Ca) MG tablet Take 200 mg of elemental calcium by mouth daily.     carvedilol  (  COREG ) 3.125 MG tablet Take 1 tablet (3.125 mg total) by mouth 2 (two) times daily. (Patient not taking: Reported on 09/08/2024) 180 tablet 3   Cholecalciferol  (VITAMIN D3) 1.25 MG (50000 UT) CAPS Take 1 capsule (50,000 Units total) by mouth once a week. 12 capsule 3   furosemide  (LASIX ) 20 MG tablet Take 1 tablet (20 mg total) by mouth daily as needed. 10 tablet 0   Multiple Vitamin (MULTIVITAMIN WITH MINERALS) TABS tablet Take 2 tablets by mouth daily.     No current facility-administered medications for this visit.    Allergies  Allergen Reactions   Tdap [Tetanus-Diphth-Acell  Pertussis] Shortness Of Breath      Social History   Socioeconomic History   Marital status: Widowed    Spouse name: Not on file   Number of children: Not on file   Years of education: Not on file   Highest education level: Not on file  Occupational History   Not on file  Tobacco Use   Smoking status: Never   Smokeless tobacco: Never  Vaping Use   Vaping status: Never Used  Substance and Sexual Activity   Alcohol use: No   Drug use: No   Sexual activity: Not on file  Other Topics Concern   Not on file  Social History Narrative   Not on file   Social Drivers of Health   Financial Resource Strain: Not on file  Food Insecurity: Not on file  Transportation Needs: Not on file  Physical Activity: Not on file  Stress: Not on file  Social Connections: Not on file  Intimate Partner Violence: Not on file      Family History  Problem Relation Age of Onset   Heart failure Mother    Hypertension Mother    Hyperlipidemia Mother    Kidney disease Mother    CVA Mother    Hypothyroidism Mother    Breast cancer Maternal Aunt 58   Vitals:   10/27/24 1409  BP: 129/72  Pulse: 81  SpO2: 100%  Weight: 237 lb (107.5 kg)   Wt Readings from Last 3 Encounters:  10/27/24 237 lb (107.5 kg)  09/08/24 227 lb (103 kg)  08/15/24 228 lb (103.4 kg)   Lab Results  Component Value Date   CREATININE 0.73 07/19/2024   CREATININE 0.84 12/11/2023   CREATININE 0.76 11/08/2023   PHYSICAL EXAM:  General: Well appearing.  Cor: No JVD. Regular rhythm, rate.  Lungs: clear Abdomen: soft, nontender, nondistended. Extremities: trace pitting edema bilateral lower legs Neuro:. Affect pleasant   EKG: NSR, LVH with HR 66 (personally reviewed)   ASSESSMENT & PLAN:  1. Chronic HF - HFmrEF - EF stable 40-45% Unclear etiology - Echo 12/04/20 EF 45-50% - Unable to due MRI due to severe claustrophobia  - Stable NYHA I - Euvolemic. PRN Lasix , last took it ~ 1 month ago - no longer on any  beta-blocker or entresto  - will get echo updated, order placed today - will revisit GDMT after echo completed - Coronary CTA 10/22/21 with aortic atherosclerosis - Ok to proceed with colonoscopy with RCRI score of 1.1% risk of major cardiac event  2. HTN - BP 129/72 - sees PCP HURSHEL Bathe) - BMET 07/19/24 reviewed: sodium 141, potassium 4.5, creatinine 0.73 - BMET today  3. OSA: - stopped using cpap after weight loss but has not had formal sleep study - Itamar done 11/22. Mild sleep apnea. Cpap ordered by Wilbert Bihari, MD  4. PAF - had one episode  of AF that was caught on strip at work. No known AF since - zio placed 11/22, predominant underlying rhythm was NSR, five runs of SVT, longest was 6 beats - no longer on beta-blockers   Return in 4 months, sooner depending on echo results.   I spent 33 minutes reviewing records, interviewing/ examing patient and managing plan/ orders.   Ellouise DELENA Class, FNP-C 10/27/24

## 2024-10-28 ENCOUNTER — Ambulatory Visit: Payer: Self-pay | Admitting: Family

## 2024-10-31 ENCOUNTER — Other Ambulatory Visit: Payer: Self-pay

## 2024-10-31 ENCOUNTER — Ambulatory Visit (HOSPITAL_COMMUNITY): Admitting: Certified Registered"

## 2024-10-31 ENCOUNTER — Encounter (HOSPITAL_COMMUNITY): Admission: RE | Disposition: A | Payer: Self-pay | Source: Home / Self Care | Attending: Gastroenterology

## 2024-10-31 ENCOUNTER — Encounter (HOSPITAL_COMMUNITY): Payer: Self-pay | Admitting: Gastroenterology

## 2024-10-31 ENCOUNTER — Ambulatory Visit (HOSPITAL_COMMUNITY)
Admission: RE | Admit: 2024-10-31 | Discharge: 2024-10-31 | Disposition: A | Attending: Gastroenterology | Admitting: Gastroenterology

## 2024-10-31 DIAGNOSIS — G4730 Sleep apnea, unspecified: Secondary | ICD-10-CM | POA: Diagnosis not present

## 2024-10-31 DIAGNOSIS — Z860100 Personal history of colon polyps, unspecified: Secondary | ICD-10-CM

## 2024-10-31 DIAGNOSIS — Z853 Personal history of malignant neoplasm of breast: Secondary | ICD-10-CM | POA: Diagnosis not present

## 2024-10-31 DIAGNOSIS — Z1211 Encounter for screening for malignant neoplasm of colon: Secondary | ICD-10-CM | POA: Diagnosis not present

## 2024-10-31 DIAGNOSIS — Z79899 Other long term (current) drug therapy: Secondary | ICD-10-CM | POA: Diagnosis not present

## 2024-10-31 DIAGNOSIS — E669 Obesity, unspecified: Secondary | ICD-10-CM | POA: Diagnosis not present

## 2024-10-31 DIAGNOSIS — I509 Heart failure, unspecified: Secondary | ICD-10-CM | POA: Diagnosis not present

## 2024-10-31 DIAGNOSIS — K635 Polyp of colon: Secondary | ICD-10-CM | POA: Diagnosis not present

## 2024-10-31 DIAGNOSIS — Z6837 Body mass index (BMI) 37.0-37.9, adult: Secondary | ICD-10-CM | POA: Diagnosis not present

## 2024-10-31 DIAGNOSIS — I4891 Unspecified atrial fibrillation: Secondary | ICD-10-CM | POA: Diagnosis not present

## 2024-10-31 DIAGNOSIS — Q438 Other specified congenital malformations of intestine: Secondary | ICD-10-CM | POA: Diagnosis not present

## 2024-10-31 DIAGNOSIS — D123 Benign neoplasm of transverse colon: Secondary | ICD-10-CM | POA: Diagnosis not present

## 2024-10-31 DIAGNOSIS — D122 Benign neoplasm of ascending colon: Secondary | ICD-10-CM | POA: Diagnosis not present

## 2024-10-31 DIAGNOSIS — I110 Hypertensive heart disease with heart failure: Secondary | ICD-10-CM | POA: Diagnosis not present

## 2024-10-31 HISTORY — PX: COLONOSCOPY: SHX5424

## 2024-10-31 SURGERY — COLONOSCOPY
Anesthesia: Monitor Anesthesia Care

## 2024-10-31 MED ORDER — PROPOFOL 1000 MG/100ML IV EMUL
INTRAVENOUS | Status: AC
  Filled 2024-10-31: qty 100

## 2024-10-31 MED ORDER — PROPOFOL 500 MG/50ML IV EMUL
INTRAVENOUS | Status: DC | PRN
  Administered 2024-10-31: 135 ug/kg/min via INTRAVENOUS

## 2024-10-31 MED ORDER — PROPOFOL 10 MG/ML IV BOLUS
INTRAVENOUS | Status: AC
  Filled 2024-10-31: qty 20

## 2024-10-31 MED ORDER — SODIUM CHLORIDE 0.9 % IV SOLN: INTRAVENOUS | Status: DC

## 2024-10-31 MED ORDER — SODIUM CHLORIDE 0.9 % IV SOLN
INTRAVENOUS | Status: AC | PRN
  Administered 2024-10-31: 500 mL via INTRAMUSCULAR

## 2024-10-31 MED ORDER — PROPOFOL 10 MG/ML IV BOLUS
INTRAVENOUS | Status: DC | PRN
  Administered 2024-10-31: 30 mg via INTRAVENOUS

## 2024-10-31 NOTE — Discharge Instructions (Signed)

## 2024-10-31 NOTE — Anesthesia Procedure Notes (Signed)
 Procedure Name: MAC Date/Time: 10/31/2024 11:09 AM  Performed by: Brandy Almarie BROCKS, CRNAPre-anesthesia Checklist: Patient identified, Emergency Drugs available, Suction available and Patient being monitored Oxygen Delivery Method: Simple face mask

## 2024-10-31 NOTE — H&P (Signed)
 History and Physical:  This patient presents for endoscopic testing for: History of colon polyps  63 year old woman here today for surveillance colonoscopy.  History of polyps of unknown pathology based on prior records.  Clinical details in my 09/08/2024 office note.  No significant clinical changes since then. This patient was also seen in cardiology clinic on 10/27/2024 for preprocedure cardiac evaluation, no further cardiac testing was needed and she was felt to be a low risk of major cardiac event (note on file).   Patient is otherwise without complaints or active issues today.   Past Medical History: Past Medical History:  Diagnosis Date   Atrial fibrillation (HCC)    Breast cancer (HCC) 1990/1997    left mastectomy ,had recurrence in left suprclavic nodes several yrs ago-treated with radiation and chemo   CHF (congestive heart failure) (HCC)    Colon polyp 2008   Complication of anesthesia    Difficult intubation    expectorated blood 3-4 days after surgery in 2011   Difficult intubation    hypoxia with endoscopy in 2016   GERD (gastroesophageal reflux disease)    History of chemotherapy    5-FU/AC in 1990 under the care of Dr. Wilder. pt also tx with tamoxifen and arimidex   History of radiation therapy    radiation treat at [facility]   Hypertension 2008   Personal history of chemotherapy 1990   BREAST CA   Personal history of malignant neoplasm of breast 1990    left mastectomy    Personal history of radiation therapy 1997   BREAST CA   Sinusitis    Sleep apnea    history of, went away after gastric surgery     Past Surgical History: Past Surgical History:  Procedure Laterality Date   abdomenoplasty     ABDOMINAL HYSTERECTOMY  1996   COLONOSCOPY  2008, 2015   Dr. Dellie   COLONOSCOPY WITH PROPOFOL  N/A 10/12/2019   Procedure: COLONOSCOPY WITH PROPOFOL ;  Surgeon: Dessa Reyes ORN, MD;  Location: [facility];  Service: Endoscopy;  Laterality: N/A;  DR BYRNETT  WILL UPDATE H&P ON PROCEDURE DATE   DILATION AND CURETTAGE OF UTERUS     KNEE ARTHROPLASTY Left 09/07/2017   Procedure: COMPUTER ASSISTED TOTAL KNEE ARTHROPLASTY;  Surgeon: Mardee Lynwood SQUIBB, MD;  Location: [facility] OR[facility];  Service: Orthopedics;  Laterality: Left;   KNEE ARTHROSCOPY Bilateral    KNEE ARTHROSCOPY Right 06/24/2022   Procedure: RIGHT KNEE ARTHROSCOPY;  Surgeon: Sheril Coy, MD;  Location: [facility];  Service: Orthopedics;  Laterality: Right;   KNEE SURGERY Left 2009, 2010, 2012   LAPAROSCOPIC GASTRIC RESTRICTIVE DUODENAL PROCEDURE (DUODENAL SWITCH) N/A 04/17/2015   Procedure: LAPAROSCOPIC GASTRIC RESTRICTIVE DUODENAL PROCEDURE, single anastamosis ;  Surgeon: Thom CHRISTELLA Pin, MD;  Location: [facility] OR[facility];  Service: General;  Laterality: N/A;   MASTECTOMY Left 1990   BREAST CA   TONSILLECTOMY     UPPER GI ENDOSCOPY N/A 04/17/2015   Procedure: UPPER GI ENDOSCOPY;  Surgeon: Thom CHRISTELLA Pin, MD;  Location: [facility] OR[facility];  Service: General;  Laterality: N/A;    Allergies: Allergies  Allergen Reactions   Tdap [Tetanus-Diphth-Acell Pertussis] Shortness Of Breath    Outpatient Meds: Current Facility-Administered Medications  Medication Dose Route Frequency Provider Last Rate Last Admin   0.9 %  sodium chloride  infusion   Intravenous Continuous Danis, Victory CROME III, MD       0.9 %  sodium chloride  infusion    Continuous PRN Legrand Victory CROME III, MD 10 mL/hr at 10/31/24  1057 Continued from Pre-op at 10/31/24 1057      ___________________________________________________________________ Objective   Exam:  BP (!) 140/52   Pulse 67   Temp (!) 97.5 F (36.4 C) (Temporal)   Resp 12   Ht 5' 6 (1.676 m)   Wt 106.6 kg   LMP  (LMP Unknown)   SpO2 99%   BMI 37.93 kg/m   CV: regular , S1/S2 Resp: clear to auscultation bilaterally, normal RR and effort noted GI: soft, no tenderness, with active bowel sounds.   Assessment: History of colon polyps   Plan: Colonoscopy   The benefits and risks of  the planned procedure(s) were described in detail with the patient or (when appropriate) their health care proxy.  Risks were outlined as including, but not limited to, bleeding, infection, perforation, adverse medication reaction leading to cardiac or pulmonary decompensation, pancreatitis (if ERCP).  The limitation of incomplete mucosal visualization was also discussed.  No guarantees or warranties were given.  The patient was provided an opportunity to ask questions and all were answered. The patient agreed with the plan.   The patient is appropriate for an endoscopic procedure in the ambulatory setting.   - Victory Brand, MD

## 2024-10-31 NOTE — Op Note (Signed)
 [facility] Patient Name: Mandy Roberts Procedure Date: 10/31/2024 MRN: [id number] Attending MD: Victory CROME. Legrand , MD, [id number] Date of Birth: 1961-01-12 CSN: [id number] Age: 63 Admit Type: Outpatient Procedure:                Colonoscopy Indications:              High risk colon cancer surveillance: Personal                            history of colonic polyps                           polyps of unkown pathology prior to a 2015                            colonoscopy                           no polyps in 2015 or Nov 2020 (see most recent                            office note for clinical details) Prior scopes done                            at outside (surgical) practice                           this procedure was performed in the hospital                            endoscopy dept. due to Hx difficult intubation and                            IV placement Providers:                Victory L. Legrand, MD, Jacquelyn Jaci Pierce, RN,                            Curtistine Bishop, Technician Referring MD:             Fredy Bathe, MD Medicines:                Monitored Anesthesia Care Complications:            No immediate complications. Estimated Blood Loss:     Estimated blood loss was minimal. Procedure:                Pre-Anesthesia Assessment:                           - Prior to the procedure, a History and Physical                            was performed, and patient medications and                            allergies were reviewed. The patient's tolerance of  previous anesthesia was also reviewed. The risks                            and benefits of the procedure and the sedation                            options and risks were discussed with the patient.                            All questions were answered, and informed consent                            was obtained. Prior Anticoagulants: The patient has                             taken no anticoagulant or antiplatelet agents. ASA                            Grade Assessment: III - A patient with severe                            systemic disease. After reviewing the risks and                            benefits, the patient was deemed in satisfactory                            condition to undergo the procedure.                           After obtaining informed consent, the colonoscope                            was passed under direct vision. Throughout the                            procedure, the patient's blood pressure, pulse, and                            oxygen saturations were monitored continuously. The                            CF-HQ190L ([id number]) Olympus colonoscope was                            introduced through the anus and advanced to the the                            cecum, identified by appendiceal orifice and                            ileocecal valve. The colonoscopy was somewhat  difficult due to a redundant colon. Successful                            completion of the procedure was aided by using                            manual pressure and straightening and shortening                            the scope to obtain bowel loop reduction. The                            patient tolerated the procedure well. The quality                            of the bowel preparation was good. The ileocecal                            valve, appendiceal orifice, and rectum were                            photographed. Scope In: 11:14:23 AM Scope Out: 11:32:59 AM Scope Withdrawal Time: 0 hours 13 minutes 57 seconds  Total Procedure Duration: 0 hours 18 minutes 36 seconds  Findings:      The perianal and digital rectal examinations were normal.      Repeat examination of right colon under NBI performed.      Two sessile polyps were found in the ascending colon. The polyps were 6       mm in size. These polyps were removed with a  cold snare. Resection and       retrieval were complete. Jar 1      A 10 mm polyp was found in the transverse colon. The polyp was sessile.       The polyp was removed with a cold snare. Resection and retrieval were       complete. Jar 2      The exam was otherwise without abnormality on direct and retroflexion       views. Impression:               - Two 6 mm polyps in the ascending colon, removed                            with a cold snare. Resected and retrieved.                           - One 10 mm polyp in the transverse colon, removed                            with a cold snare. Resected and retrieved.                           - The examination was otherwise normal on direct  and retroflexion views. Moderate Sedation:      MAC sedation used Recommendation:           - Patient has a contact number available for                            emergencies. The signs and symptoms of potential                            delayed complications were discussed with the                            patient. Return to normal activities tomorrow.                            Written discharge instructions were provided to the                            patient.                           - Resume previous diet.                           - Continue present medications.                           - Await pathology results.                           - Repeat colonoscopy is recommended for                            surveillance. The colonoscopy date will be                            determined after pathology results from today's                            exam become available for review. Procedure Code(s):        --- Professional ---                           [phone], Colonoscopy, flexible; with removal of                            tumor(s), polyp(s), or other lesion(s) by snare                            technique Diagnosis Code(s):        --- Professional ---                            Z86.010, Personal history of colonic polyps                           D12.2, Benign neoplasm of ascending colon  D12.3, Benign neoplasm of transverse colon (hepatic                            flexure or splenic flexure) CPT copyright 2022 [employer]. All rights reserved. The codes documented in this report are preliminary and upon coder review may  be revised to meet current compliance requirements. Jean Alejos L. Legrand, MD 10/31/2024 11:40:21 AM This report has been signed electronically. Number of Addenda: 0

## 2024-10-31 NOTE — Interval H&P Note (Signed)
 History and Physical Interval Note:  10/31/2024 11:02 AM  Mandy Roberts  has presented today for surgery, with the diagnosis of Hx of colonic polyps [Z86.0100].  The various methods of treatment have been discussed with the patient and family. After consideration of risks, benefits and other options for treatment, the patient has consented to  Procedure(s): COLONOSCOPY (N/A) as a surgical intervention.  The patient's history has been reviewed, patient examined, no change in status, stable for surgery.  I have reviewed the patient's chart and labs.  Questions were answered to the patient's satisfaction.     Victory LITTIE Brand III

## 2024-10-31 NOTE — Anesthesia Preprocedure Evaluation (Addendum)
 Anesthesia Evaluation  Patient identified by MRN, date of birth, ID band Patient awake    Reviewed: Allergy & Precautions, NPO status , Patient's Chart, lab work & pertinent test results  History of Anesthesia Complications (+) DIFFICULT AIRWAY, DIFFICULT IV STICK / SPECIAL LINE and history of anesthetic complications (hypoxia with endoscopy in 2016)  Airway Mallampati: III  TM Distance: <3 FB Neck ROM: Full    Dental  (+) Dental Advisory Given, Caps, Teeth Intact   Pulmonary sleep apnea    Pulmonary exam normal breath sounds clear to auscultation       Cardiovascular hypertension, Pt. on medications +CHF  Normal cardiovascular exam+ dysrhythmias Atrial Fibrillation  Rhythm:Regular Rate:Normal     Neuro/Psych  PSYCHIATRIC DISORDERS Anxiety     negative neurological ROS     GI/Hepatic Neg liver ROS,GERD  ,,Hx of colonic polyps   Endo/Other  Obesity   Renal/GU negative Renal ROS     Musculoskeletal  (+) Arthritis ,    Abdominal   Peds  Hematology negative hematology ROS (+)   Anesthesia Other Findings Day of surgery medications reviewed with the patient.  H/o left breast cancer   Reproductive/Obstetrics                              Anesthesia Physical Anesthesia Plan  ASA: 3  Anesthesia Plan: MAC   Post-op Pain Management: Minimal or no pain anticipated   Induction: Intravenous  PONV Risk Score and Plan: 2 and TIVA and Treatment may vary due to age or medical condition  Airway Management Planned: Natural Airway and Simple Face Mask  Additional Equipment:   Intra-op Plan:   Post-operative Plan:   Informed Consent: I have reviewed the patients History and Physical, chart, labs and discussed the procedure including the risks, benefits and alternatives for the proposed anesthesia with the patient or authorized representative who has indicated his/her understanding and  acceptance.     Dental advisory given  Plan Discussed with: CRNA  Anesthesia Plan Comments:          Anesthesia Quick Evaluation

## 2024-10-31 NOTE — Transfer of Care (Signed)
 Immediate Anesthesia Transfer of Care Note  Patient: Mandy Roberts  Procedure(s) Performed: COLONOSCOPY  Patient Location: PACU  Anesthesia Type:MAC  Level of Consciousness: awake and alert   Airway & Oxygen Therapy: Patient Spontanous Breathing and Patient connected to face mask oxygen  Post-op Assessment: Report given to RN, Post -op Vital signs reviewed and stable, and Patient moving all extremities X 4  Post vital signs: Reviewed and stable  Last Vitals:  Vitals Value Taken Time  BP 105/59   Temp    Pulse 82 10/31/24 11:37  Resp 22 10/31/24 11:37  SpO2 100 % 10/31/24 11:37  Vitals shown include unfiled device data.  Last Pain:  Vitals:   10/31/24 1005  TempSrc: Temporal  PainSc: 0-No pain         Complications: No notable events documented.

## 2024-11-01 ENCOUNTER — Ambulatory Visit: Payer: Self-pay | Admitting: Gastroenterology

## 2024-11-01 LAB — SURGICAL PATHOLOGY

## 2024-11-01 NOTE — Anesthesia Postprocedure Evaluation (Signed)
 Anesthesia Post Note  Patient: Mandy Roberts  Procedure(s) Performed: COLONOSCOPY     Patient location during evaluation: PACU Anesthesia Type: MAC Level of consciousness: awake and alert Pain management: pain level controlled Vital Signs Assessment: post-procedure vital signs reviewed and stable Respiratory status: spontaneous breathing, nonlabored ventilation and respiratory function stable Cardiovascular status: stable and blood pressure returned to baseline Postop Assessment: no apparent nausea or vomiting Anesthetic complications: no   No notable events documented.  Last Vitals:  Vitals:   10/31/24 1200 10/31/24 1210  BP: 130/73 126/73  Pulse: 64 62  Resp: 17 18  Temp:    SpO2: 99% 97%    Last Pain:  Vitals:   10/31/24 1210  TempSrc:   PainSc: 0-No pain                 [name]

## 2024-11-01 NOTE — Telephone Encounter (Signed)
 Letter mailed, recall placed.

## 2024-11-02 ENCOUNTER — Encounter (HOSPITAL_COMMUNITY): Payer: Self-pay | Admitting: Gastroenterology

## 2024-11-02 ENCOUNTER — Ambulatory Visit: Admission: RE | Admit: 2024-11-02 | Discharge: 2024-11-02 | Attending: Family

## 2024-11-02 DIAGNOSIS — I110 Hypertensive heart disease with heart failure: Secondary | ICD-10-CM | POA: Insufficient documentation

## 2024-11-02 DIAGNOSIS — I5022 Chronic systolic (congestive) heart failure: Secondary | ICD-10-CM | POA: Diagnosis not present

## 2024-11-02 DIAGNOSIS — I340 Nonrheumatic mitral (valve) insufficiency: Secondary | ICD-10-CM | POA: Diagnosis not present

## 2024-11-02 LAB — ECHOCARDIOGRAM COMPLETE
AR max vel: 2.73 cm2
AV Area VTI: 2.83 cm2
AV Area mean vel: 2.72 cm2
AV Mean grad: 4 mmHg
AV Peak grad: 7.1 mmHg
Ao pk vel: 1.33 m/s
Area-P 1/2: 3.3 cm2
S' Lateral: 3.5 cm

## 2024-11-03 ENCOUNTER — Ambulatory Visit: Payer: Self-pay | Admitting: Family

## 2025-02-23 ENCOUNTER — Ambulatory Visit: Admitting: Family
